# Patient Record
Sex: Male | Born: 1950 | Race: White | Hispanic: No | Marital: Married
Health system: Midwestern US, Academic
[De-identification: ages and names within clinical notes are randomized; demographics above are authoritative.]

---

## 2018-07-21 ENCOUNTER — Encounter: Admit: 2018-07-21 | Discharge: 2018-07-22 | Payer: MEDICARE | Primary: Family

## 2018-08-16 ENCOUNTER — Encounter: Admit: 2018-08-16 | Discharge: 2018-08-16 | Payer: MEDICARE | Primary: Family

## 2018-09-12 ENCOUNTER — Encounter: Admit: 2018-09-12 | Discharge: 2018-09-13 | Payer: MEDICARE | Primary: Family

## 2018-09-18 ENCOUNTER — Encounter: Admit: 2018-09-18 | Discharge: 2018-09-19 | Payer: MEDICARE | Primary: Family

## 2018-09-22 ENCOUNTER — Encounter: Admit: 2018-09-22 | Discharge: 2018-09-23 | Payer: MEDICARE | Primary: Family

## 2018-09-23 ENCOUNTER — Encounter: Admit: 2018-09-23 | Discharge: 2018-09-23 | Payer: MEDICARE | Primary: Family

## 2018-09-23 DIAGNOSIS — M7022 Olecranon bursitis, left elbow: ICD-10-CM

## 2018-09-23 DIAGNOSIS — I4891 Unspecified atrial fibrillation: ICD-10-CM

## 2018-09-23 LAB — HEMOGLOBIN A1C: Lab: 6.4 % — ABNORMAL HIGH (ref <150)

## 2018-09-23 LAB — URINALYSIS DIPSTICK
Lab: NEGATIVE
Lab: NEGATIVE
Lab: NEGATIVE 10*3/uL (ref 0–0.45)
Lab: NEGATIVE K/UL — ABNORMAL HIGH (ref 3–12)
Lab: NEGATIVE U/L (ref 7–56)
Lab: NEGATIVE mL/min (ref 0–0.80)
Lab: NEGATIVE mL/min — ABNORMAL LOW (ref 1.0–4.8)

## 2018-09-23 LAB — PHOSPHORUS: Lab: 3.1 mg/dL — ABNORMAL LOW (ref 2.0–4.5)

## 2018-09-23 LAB — BNP (B-TYPE NATRIURETIC PEPTI): Lab: 302 pg/mL — ABNORMAL HIGH (ref 0–100)

## 2018-09-23 LAB — MAGNESIUM: Lab: 1.7 mg/dL — ABNORMAL LOW (ref 1.6–2.6)

## 2018-09-23 LAB — COMPREHENSIVE METABOLIC PANEL: Lab: 139 MMOL/L — ABNORMAL LOW (ref 137–147)

## 2018-09-23 LAB — TSH WITH FREE T4 REFLEX: Lab: 3.9 uU/mL — ABNORMAL LOW (ref >40)

## 2018-09-23 LAB — LIPID PROFILE: Lab: 119 mg/dL (ref <200)

## 2018-09-23 LAB — CBC AND DIFF: Lab: 8.6 K/UL (ref 4.5–11.0)

## 2018-09-23 LAB — URINALYSIS, MICROSCOPIC

## 2018-09-23 LAB — SED RATE: Lab: 68 mm/h — ABNORMAL HIGH (ref 0–20)

## 2018-09-23 LAB — LACTIC ACID(LACTATE): Lab: 1.2 MMOL/L (ref 0.5–2.0)

## 2018-09-23 LAB — C REACTIVE PROTEIN (CRP): Lab: 14 mg/dL — ABNORMAL HIGH (ref <1.0)

## 2018-09-23 MED ORDER — IPRATROPIUM BROMIDE 0.02 % IN SOLN
.5 mg | Freq: Once | RESPIRATORY_TRACT | 0 refills | Status: CP
Start: 2018-09-23 — End: ?
  Administered 2018-09-23: 15:00:00 0.5 mg via RESPIRATORY_TRACT

## 2018-09-23 MED ORDER — VANCOMYCIN PHARMACY TO MANAGE
1 | 0 refills | Status: DC
Start: 2018-09-23 — End: 2018-09-23

## 2018-09-23 MED ORDER — OXYCODONE 5 MG PO TAB
5-10 mg | ORAL | 0 refills | Status: DC | PRN
Start: 2018-09-23 — End: 2018-10-01
  Administered 2018-09-23 – 2018-09-25 (×6): 10 mg via ORAL
  Administered 2018-09-26 (×2): 5 mg via ORAL
  Administered 2018-09-26: 20:00:00 10 mg via ORAL
  Administered 2018-09-26: 03:00:00 5 mg via ORAL
  Administered 2018-09-26 – 2018-10-01 (×18): 10 mg via ORAL

## 2018-09-23 MED ORDER — IPRATROPIUM BROMIDE 0.02 % IN SOLN
0.5 mg | RESPIRATORY_TRACT | 0 refills | Status: DC | PRN
Start: 2018-09-23 — End: 2018-09-23

## 2018-09-23 MED ORDER — HYDROCODONE-ACETAMINOPHEN 5-325 MG PO TAB
1-2 | ORAL | 0 refills | Status: DC | PRN
Start: 2018-09-23 — End: 2018-09-23
  Administered 2018-09-23: 15:00:00 2 via ORAL

## 2018-09-23 MED ORDER — FUROSEMIDE 10 MG/ML IJ SOLN
40 mg | Freq: Every day | INTRAVENOUS | 0 refills | Status: DC
Start: 2018-09-23 — End: 2018-09-24
  Administered 2018-09-23: 17:00:00 40 mg via INTRAVENOUS

## 2018-09-23 MED ORDER — METOPROLOL TARTRATE 25 MG PO TAB
25 mg | Freq: Once | ORAL | 0 refills | Status: CP
Start: 2018-09-23 — End: ?
  Administered 2018-09-23: 20:00:00 25 mg via ORAL

## 2018-09-23 MED ORDER — ASPIRIN 81 MG PO TBEC
81 mg | Freq: Every day | ORAL | 0 refills | Status: DC
Start: 2018-09-23 — End: 2018-10-01
  Administered 2018-09-23 – 2018-10-01 (×8): 81 mg via ORAL

## 2018-09-23 MED ORDER — AMIODARONE 200 MG PO TAB
200 mg | Freq: Two times a day (BID) | ORAL | 0 refills | Status: DC
Start: 2018-09-23 — End: 2018-09-23
  Administered 2018-09-23: 15:00:00 200 mg via ORAL

## 2018-09-23 MED ORDER — MAGNESIUM SULFATE IN D5W 1 GRAM/100 ML IV PGBK
1 g | Freq: Once | INTRAVENOUS | 0 refills | Status: CP
Start: 2018-09-23 — End: ?
  Administered 2018-09-24: 03:00:00 1 g via INTRAVENOUS

## 2018-09-23 MED ORDER — RIVAROXABAN 20 MG PO TAB
20 mg | Freq: Every day | ORAL | 0 refills | Status: DC
Start: 2018-09-23 — End: 2018-09-24
  Administered 2018-09-23: 23:00:00 20 mg via ORAL

## 2018-09-23 MED ORDER — DIGOXIN 125 MCG (0.125 MG) PO TAB
250 ug | Freq: Once | ORAL | 0 refills | Status: CP
Start: 2018-09-23 — End: ?
  Administered 2018-09-23: 20:00:00 250 ug via ORAL

## 2018-09-23 MED ORDER — VANCOMYCIN 1,750 MG IVPB
15 mg/kg | INTRAVENOUS | 0 refills | Status: DC
Start: 2018-09-23 — End: 2018-09-23

## 2018-09-23 MED ORDER — VENLAFAXINE 150 MG PO CP24
150 mg | Freq: Every day | ORAL | 0 refills | Status: DC
Start: 2018-09-23 — End: 2018-10-01
  Administered 2018-09-23 – 2018-10-01 (×9): 150 mg via ORAL

## 2018-09-23 MED ORDER — TAMSULOSIN 0.4 MG PO CAP
0.4 mg | Freq: Every day | ORAL | 0 refills | Status: DC
Start: 2018-09-23 — End: 2018-10-01
  Administered 2018-09-23 – 2018-10-01 (×9): 0.4 mg via ORAL

## 2018-09-23 MED ORDER — ALBUTEROL SULFATE 2.5 MG/0.5 ML IN NEBU
2.5 mg | Freq: Once | RESPIRATORY_TRACT | 0 refills | Status: CP
Start: 2018-09-23 — End: ?
  Administered 2018-09-23: 15:00:00 2.5 mg via RESPIRATORY_TRACT

## 2018-09-23 MED ORDER — GABAPENTIN 300 MG PO CAP
600 mg | Freq: Two times a day (BID) | ORAL | 0 refills | Status: DC
Start: 2018-09-23 — End: 2018-10-01
  Administered 2018-09-23 – 2018-10-01 (×17): 600 mg via ORAL

## 2018-09-23 MED ORDER — LACTATED RINGERS IV SOLP
500 mL | INTRAVENOUS | 0 refills | Status: CP
Start: 2018-09-23 — End: ?
  Administered 2018-09-24: 03:00:00 500 mL via INTRAVENOUS

## 2018-09-23 MED ORDER — ATORVASTATIN 40 MG PO TAB
40 mg | Freq: Every evening | ORAL | 0 refills | Status: DC
Start: 2018-09-23 — End: 2018-10-01
  Administered 2018-09-24 – 2018-10-01 (×8): 40 mg via ORAL

## 2018-09-23 MED ORDER — ACETAMINOPHEN 500 MG PO TAB
1000 mg | ORAL | 0 refills | Status: DC
Start: 2018-09-23 — End: 2018-10-01
  Administered 2018-09-23 – 2018-10-01 (×21): 1000 mg via ORAL

## 2018-09-23 MED ORDER — DAPTOMYCIN IVPB
6 mg/kg | INTRAVENOUS | 0 refills | Status: DC
Start: 2018-09-23 — End: 2018-10-01
  Administered 2018-09-23 – 2018-10-01 (×18): 550 mg via INTRAVENOUS

## 2018-09-23 MED ORDER — UMECLIDINIUM-VILANTEROL 62.5-25 MCG/ACTUATION IN DSDV
1 | Freq: Every day | RESPIRATORY_TRACT | 0 refills | Status: DC
Start: 2018-09-23 — End: 2018-10-01
  Administered 2018-09-23: 15:00:00 1 via RESPIRATORY_TRACT

## 2018-09-23 MED ORDER — ONDANSETRON HCL (PF) 4 MG/2 ML IJ SOLN
4 mg | INTRAVENOUS | 0 refills | Status: DC | PRN
Start: 2018-09-23 — End: 2018-10-01

## 2018-09-23 MED ORDER — FLUTICASONE PROPIONATE 110 MCG/ACTUATION IN HFAA
2 | Freq: Two times a day (BID) | RESPIRATORY_TRACT | 0 refills | Status: DC
Start: 2018-09-23 — End: 2018-10-01
  Administered 2018-09-24: 12:00:00 2 via RESPIRATORY_TRACT

## 2018-09-23 MED ORDER — METOPROLOL TARTRATE 25 MG PO TAB
25 mg | Freq: Two times a day (BID) | ORAL | 0 refills | Status: DC
Start: 2018-09-23 — End: 2018-09-23
  Administered 2018-09-23: 15:00:00 25 mg via ORAL

## 2018-09-23 MED ORDER — ALBUTEROL SULFATE 2.5 MG/0.5 ML IN NEBU
2.5 mg | RESPIRATORY_TRACT | 0 refills | Status: DC | PRN
Start: 2018-09-23 — End: 2018-10-01
  Administered 2018-09-23 – 2018-09-30 (×21): 2.5 mg via RESPIRATORY_TRACT

## 2018-09-23 MED ORDER — METOPROLOL TARTRATE 50 MG PO TAB
50 mg | Freq: Two times a day (BID) | ORAL | 0 refills | Status: DC
Start: 2018-09-23 — End: 2018-10-01
  Administered 2018-09-24 – 2018-10-01 (×16): 50 mg via ORAL

## 2018-09-23 MED ORDER — MELATONIN 3 MG PO TAB
3 mg | Freq: Every evening | ORAL | 0 refills | Status: DC | PRN
Start: 2018-09-23 — End: 2018-10-01
  Administered 2018-09-25 – 2018-10-01 (×7): 3 mg via ORAL

## 2018-09-23 MED ORDER — DIGOXIN 125 MCG (0.125 MG) PO TAB
125 ug | Freq: Every day | ORAL | 0 refills | Status: DC
Start: 2018-09-23 — End: 2018-10-01
  Administered 2018-09-24 – 2018-10-01 (×8): 125 ug via ORAL

## 2018-09-23 MED ORDER — ONDANSETRON HCL 4 MG PO TAB
4 mg | ORAL | 0 refills | Status: DC | PRN
Start: 2018-09-23 — End: 2018-10-01

## 2018-09-23 MED ORDER — DILTIAZEM HCL 180 MG PO CP24
180 mg | Freq: Every day | ORAL | 0 refills | Status: DC
Start: 2018-09-23 — End: 2018-09-23

## 2018-09-24 ENCOUNTER — Encounter: Admit: 2018-09-24 | Discharge: 2018-09-24 | Payer: MEDICARE | Primary: Family

## 2018-09-24 ENCOUNTER — Encounter: Admit: 2018-09-24 | Discharge: 2018-09-25 | Payer: MEDICARE | Primary: Family

## 2018-09-24 DIAGNOSIS — M00022 Staphylococcal arthritis, left elbow: Secondary | ICD-10-CM

## 2018-09-24 LAB — GRAM STAIN

## 2018-09-24 LAB — COMPREHENSIVE METABOLIC PANEL
Lab: 1.2 mg/dL — ABNORMAL HIGH (ref 0.4–1.24)
Lab: 132 mg/dL — ABNORMAL HIGH (ref 70–100)
Lab: 136 MMOL/L — ABNORMAL LOW (ref 137–147)
Lab: 14 — ABNORMAL HIGH (ref 3–12)
Lab: 16 U/L (ref 7–56)
Lab: 20 mg/dL (ref 7–25)
Lab: 56 mL/min — ABNORMAL LOW (ref >60)
Lab: 6.8 g/dL (ref 6.0–8.0)
Lab: 8.6 mg/dL — ABNORMAL HIGH (ref 8.5–10.6)
Lab: >60 mL/min (ref >60)
Lab: 136 MMOL/L — ABNORMAL LOW (ref 137–147)

## 2018-09-24 LAB — TROPONIN-I: Lab: 0 ng/mL — ABNORMAL LOW (ref 0.0–0.05)

## 2018-09-24 LAB — POC BLOOD GAS ARTERIAL
Lab: 27 MMOL/L (ref 21–28)
Lab: 3 MMOL/L
Lab: 49 mmHg — ABNORMAL HIGH (ref 35–45)
Lab: 7.3 % — ABNORMAL HIGH (ref 7.35–7.45)
Lab: 90 % — ABNORMAL LOW (ref 95–99)
Lab: 31 MMOL/L — ABNORMAL HIGH (ref 21–28)
Lab: 46 mmHg — ABNORMAL HIGH (ref 35–45)
Lab: 7 MMOL/L
Lab: 7.4 (ref 7.35–7.45)
Lab: 90 mmHg (ref 80–100)
Lab: 97 % (ref 95–99)

## 2018-09-24 LAB — LACTIC ACID (BG - RAPID LACTATE): Lab: 3.6 MMOL/L — ABNORMAL HIGH (ref 0.5–2.0)

## 2018-09-24 LAB — POC LACTATE: Lab: 2.7 MMOL/L — ABNORMAL HIGH (ref 0.5–2.0)

## 2018-09-24 LAB — PHOSPHORUS
Lab: 4.5 mg/dL (ref 2.0–4.5)
Lab: 4 mg/dL — ABNORMAL LOW (ref >60)

## 2018-09-24 LAB — CBC: Lab: 9.8 10*3/uL (ref 4.5–11.0)

## 2018-09-24 LAB — CBC AND DIFF: Lab: 9 K/UL — ABNORMAL LOW (ref 4.5–11.0)

## 2018-09-24 LAB — LACTIC ACID(LACTATE): Lab: 0.8 MMOL/L — ABNORMAL LOW (ref >60)

## 2018-09-24 LAB — CREATINE KINASE-CPK: Lab: 326 U/L — ABNORMAL HIGH (ref 35–232)

## 2018-09-24 LAB — MAGNESIUM
Lab: 1.9 mg/dL (ref 1.6–2.6)
Lab: 2.5 mg/dL (ref 1.6–2.6)

## 2018-09-24 MED ORDER — LIDOCAINE (PF) 10 MG/ML (1 %) IJ SOLN
SUBCUTANEOUS | 0 refills | Status: CP
Start: 2018-09-24 — End: ?
  Administered 2018-09-24: 17:00:00 3 mL via SUBCUTANEOUS

## 2018-09-24 MED ORDER — FENTANYL CITRATE (PF) 50 MCG/ML IJ SOLN
0 refills | Status: DC
Start: 2018-09-24 — End: 2018-09-24
  Administered 2018-09-24 (×2): 25 ug via INTRAVENOUS

## 2018-09-24 MED ORDER — ELECTROLYTE-A IV SOLP
0 refills | Status: DC
Start: 2018-09-24 — End: 2018-09-24
  Administered 2018-09-24: 17:00:00 via INTRAVENOUS

## 2018-09-24 MED ORDER — PHENYLEPHRINE IN 0.9% NACL(PF) 1 MG/10 ML (100 MCG/ML) IV SYRG
INTRAVENOUS | 0 refills | Status: DC
Start: 2018-09-24 — End: 2018-09-24
  Administered 2018-09-24 (×5): 100 ug via INTRAVENOUS

## 2018-09-24 MED ORDER — METOPROLOL TARTRATE 5 MG/5 ML IV SOLN
0 refills | Status: DC
Start: 2018-09-24 — End: 2018-09-24
  Administered 2018-09-24 (×4): 5 mg via INTRAVENOUS

## 2018-09-24 MED ORDER — LORAZEPAM 2 MG/ML IJ SOLN
2 mg | Freq: Once | INTRAVENOUS | 0 refills | Status: CP
Start: 2018-09-24 — End: ?
  Administered 2018-09-24: 15:00:00 2 mg via INTRAVENOUS

## 2018-09-24 MED ORDER — DEXMEDETOMIDINE 4 MCG/ML (INFUSION)(AM)(OR)
0 refills | Status: DC
Start: 2018-09-24 — End: 2018-09-24
  Administered 2018-09-24: 17:00:00 20 ug via INTRAVENOUS

## 2018-09-24 MED ORDER — PROPOFOL INJ 10 MG/ML IV VIAL
0 refills | Status: DC
Start: 2018-09-24 — End: 2018-09-24
  Administered 2018-09-24: 17:00:00 100 mg via INTRAVENOUS
  Administered 2018-09-24: 17:00:00 40 mg via INTRAVENOUS
  Administered 2018-09-24: 17:00:00 10 mg via INTRAVENOUS
  Administered 2018-09-24: 17:00:00 30 mg via INTRAVENOUS
  Administered 2018-09-24: 17:00:00 20 mg via INTRAVENOUS

## 2018-09-24 MED ORDER — SODIUM CHLORIDE 0.9 % IJ SOLN
50 mL | Freq: Once | INTRAVENOUS | 0 refills | Status: CP
Start: 2018-09-24 — End: ?
  Administered 2018-09-24: 17:00:00 50 mL via INTRAVENOUS

## 2018-09-24 MED ORDER — MIDAZOLAM 1 MG/ML IJ SOLN
INTRAVENOUS | 0 refills | Status: DC
Start: 2018-09-24 — End: 2018-09-24

## 2018-09-24 MED ORDER — VANCOMYCIN 1,000 MG IV SOLR
0 refills | Status: DC
Start: 2018-09-24 — End: 2018-09-24
  Administered 2018-09-24: 17:00:00 1 g via TOPICAL

## 2018-09-24 MED ORDER — ONDANSETRON HCL (PF) 4 MG/2 ML IJ SOLN
INTRAVENOUS | 0 refills | Status: DC
Start: 2018-09-24 — End: 2018-09-24
  Administered 2018-09-24: 17:00:00 4 mg via INTRAVENOUS

## 2018-09-24 MED ORDER — IOHEXOL 350 MG IODINE/ML IV SOLN
100 mL | Freq: Once | INTRAVENOUS | 0 refills | Status: CP
Start: 2018-09-24 — End: ?
  Administered 2018-09-24: 17:00:00 100 mL via INTRAVENOUS

## 2018-09-24 MED ORDER — MEPIVACAINE (PF) 15 MG/ML (1.5 %) IJ SOLN
0 refills | Status: CP
Start: 2018-09-24 — End: ?
  Administered 2018-09-24: 17:00:00 30 mL

## 2018-09-25 DIAGNOSIS — M00022 Staphylococcal arthritis, left elbow: Secondary | ICD-10-CM

## 2018-09-25 LAB — CBC AND DIFF: Lab: 9 K/UL (ref 4.5–11.0)

## 2018-09-25 LAB — CREATINE KINASE-CPK: Lab: 357 U/L — ABNORMAL HIGH (ref 35–232)

## 2018-09-25 LAB — COMPREHENSIVE METABOLIC PANEL: Lab: 135 MMOL/L — ABNORMAL LOW (ref 137–147)

## 2018-09-25 MED ORDER — FUROSEMIDE 40 MG PO TAB
40 mg | Freq: Every day | ORAL | 0 refills | Status: DC
Start: 2018-09-25 — End: 2018-10-01
  Administered 2018-09-26 – 2018-10-01 (×6): 40 mg via ORAL

## 2018-09-25 MED ORDER — PERFLUTREN LIPID MICROSPHERES 1.1 MG/ML IV SUSP
1-20 mL | Freq: Once | INTRAVENOUS | 0 refills | Status: CP | PRN
Start: 2018-09-25 — End: ?
  Administered 2018-09-25: 17:00:00 1 mL via INTRAVENOUS

## 2018-09-26 ENCOUNTER — Encounter: Admit: 2018-09-26 | Discharge: 2018-09-26 | Payer: MEDICARE | Primary: Family

## 2018-09-26 ENCOUNTER — Encounter: Admit: 2018-09-26 | Discharge: 2018-09-27 | Payer: MEDICARE | Primary: Family

## 2018-09-26 DIAGNOSIS — M00022 Staphylococcal arthritis, left elbow: Secondary | ICD-10-CM

## 2018-09-26 LAB — CBC AND DIFF
Lab: 0.1 10*3/uL (ref 0–0.20)
Lab: 0.1 10*3/uL (ref 0–0.45)
Lab: 0.7 10*3/uL (ref 0–0.80)
Lab: 1 % (ref 0–2)
Lab: 1 % (ref 0–5)
Lab: 1.1 10*3/uL (ref 1.0–4.8)
Lab: 10 % (ref 4–12)
Lab: 10 g/dL — ABNORMAL LOW (ref 13.5–16.5)
Lab: 15 % — ABNORMAL HIGH (ref 11–15)
Lab: 15 % — ABNORMAL LOW (ref 24–44)
Lab: 260 10*3/uL (ref 150–400)
Lab: 29 % — ABNORMAL LOW (ref 40–50)
Lab: 3.2 M/UL — ABNORMAL LOW (ref 4.4–5.5)
Lab: 30 pg (ref 26–34)
Lab: 33 g/dL (ref 32.0–36.0)
Lab: 5.1 10*3/uL (ref 1.8–7.0)
Lab: 6.7 FL — ABNORMAL LOW (ref 7–11)
Lab: 7.1 10*3/uL (ref 4.5–11.0)
Lab: 73 % (ref 41–77)
Lab: 89 FL (ref 80–100)

## 2018-09-26 LAB — CULTURE-WOUND/TISSUE/FLUID(AEROBIC ONLY)W/SENSITIVITY

## 2018-09-26 LAB — GRAM STAIN

## 2018-09-26 LAB — CREATINE KINASE-CPK: Lab: 219 U/L — ABNORMAL LOW (ref >60)

## 2018-09-26 LAB — COMPREHENSIVE METABOLIC PANEL: Lab: 138 MMOL/L — ABNORMAL HIGH (ref >60)

## 2018-09-26 MED ORDER — HEPARIN, PORCINE (PF) 5,000 UNIT/0.5 ML IJ SYRG
5000 [IU] | SUBCUTANEOUS | 0 refills | Status: DC
Start: 2018-09-26 — End: 2018-09-27
  Administered 2018-09-26: 23:00:00 5000 [IU] via SUBCUTANEOUS

## 2018-09-26 MED ORDER — OXYCODONE 5 MG PO TAB
5-10 mg | Freq: Once | ORAL | 0 refills | Status: DC | PRN
Start: 2018-09-26 — End: 2018-09-26

## 2018-09-26 MED ORDER — HEPARIN (PORCINE) IN 5 % DEX 20,000 UNIT/500 ML (40 UNIT/ML) IV SOLP
0-2000 [IU]/h | INTRAVENOUS | 0 refills | Status: DC
Start: 2018-09-26 — End: 2018-09-26

## 2018-09-26 MED ORDER — LACTATED RINGERS IV SOLP
1000 mL | INTRAVENOUS | 0 refills | Status: DC
Start: 2018-09-26 — End: 2018-09-27

## 2018-09-26 MED ORDER — HEPARIN (PORCINE) 1,000 UNIT/ML IJ SOLN
20-40 [IU]/kg | INTRAVENOUS | 0 refills | Status: DC
Start: 2018-09-26 — End: 2018-09-30
  Administered 2018-09-27: 14:00:00 5060 [IU] via INTRAVENOUS
  Administered 2018-09-28 (×2): 2530 [IU] via INTRAVENOUS

## 2018-09-26 MED ORDER — LIDOCAINE (PF) 20 MG/ML (2 %) IJ SOLN
0 refills | Status: CP
Start: 2018-09-26 — End: ?
  Administered 2018-09-26: 16:00:00 30 mL

## 2018-09-26 MED ORDER — PHENYLEPHRINE IN 0.9% NACL(PF) 1 MG/10 ML (100 MCG/ML) IV SYRG
INTRAVENOUS | 0 refills | Status: DC
Start: 2018-09-26 — End: 2018-09-26
  Administered 2018-09-26: 16:00:00 200 ug via INTRAVENOUS
  Administered 2018-09-26: 17:00:00 100 ug via INTRAVENOUS
  Administered 2018-09-26 (×3): 200 ug via INTRAVENOUS
  Administered 2018-09-26: 17:00:00 100 ug via INTRAVENOUS

## 2018-09-26 MED ORDER — FENTANYL CITRATE (PF) 50 MCG/ML IJ SOLN
25 ug | INTRAVENOUS | 0 refills | Status: DC | PRN
Start: 2018-09-26 — End: 2018-09-26

## 2018-09-26 MED ORDER — LIDOCAINE (PF) 10 MG/ML (1 %) IJ SOLN
SUBCUTANEOUS | 0 refills | Status: CP
Start: 2018-09-26 — End: ?
  Administered 2018-09-26: 16:00:00 3 mL via SUBCUTANEOUS

## 2018-09-26 MED ORDER — PROPOFOL INJ 10 MG/ML IV VIAL
0 refills | Status: DC
Start: 2018-09-26 — End: 2018-09-26
  Administered 2018-09-26: 16:00:00 50 mg via INTRAVENOUS

## 2018-09-26 MED ORDER — HEPARIN (PORCINE) IN 5 % DEX 20,000 UNIT/500 ML (40 UNIT/ML) IV SOLP
0-2000 [IU]/h | INTRAVENOUS | 0 refills | Status: DC
Start: 2018-09-26 — End: 2018-09-30
  Administered 2018-09-27 (×2): 1700 [IU]/h via INTRAVENOUS
  Administered 2018-09-28: 15:00:00 2100 [IU]/h via INTRAVENOUS
  Administered 2018-09-28: 03:00:00 1900 [IU]/h via INTRAVENOUS
  Administered 2018-09-28: 06:00:00 2000 [IU]/h via INTRAVENOUS
  Administered 2018-09-29: 11:00:00 2100 [IU]/h via INTRAVENOUS
  Administered 2018-09-29: 21:00:00 52.5 [IU]/h via INTRAVENOUS
  Administered 2018-09-29 – 2018-09-30 (×3): 2100 [IU]/h via INTRAVENOUS

## 2018-09-26 MED ORDER — HALOPERIDOL LACTATE 5 MG/ML IJ SOLN
1 mg | Freq: Once | INTRAVENOUS | 0 refills | Status: DC | PRN
Start: 2018-09-26 — End: 2018-09-26

## 2018-09-26 MED ORDER — MIDAZOLAM 1 MG/ML IJ SOLN
INTRAVENOUS | 0 refills | Status: DC
Start: 2018-09-26 — End: 2018-09-26
  Administered 2018-09-26 (×2): 1 mg via INTRAVENOUS

## 2018-09-26 MED ORDER — DEXMEDETOMIDINE 4 MCG/ML (INFUSION)(AM)(OR)
0 refills | Status: DC
Start: 2018-09-26 — End: 2018-09-26
  Administered 2018-09-26: 16:00:00 .7 ug/kg/h via INTRAVENOUS

## 2018-09-26 MED ADMIN — LACTATED RINGERS IV SOLP [4318]: 1000 mL | INTRAVENOUS | @ 15:00:00 | Stop: 2018-09-26 | NDC 00338011704

## 2018-09-27 LAB — BASIC METABOLIC PANEL
Lab: 0.9 mg/dL (ref 0.4–1.24)
Lab: 101 MMOL/L — ABNORMAL LOW (ref 98–110)
Lab: 12 mg/dL — ABNORMAL HIGH (ref 7–25)
Lab: 139 MMOL/L — ABNORMAL LOW (ref 137–147)
Lab: 31 MMOL/L — ABNORMAL HIGH (ref 21–30)
Lab: 4.5 MMOL/L — ABNORMAL LOW (ref 3.5–5.1)
Lab: 7 pg (ref 3–12)
Lab: 8.6 mg/dL (ref 8.5–10.6)
Lab: 99 mg/dL (ref 70–100)
Lab: >60 mL/min (ref >60)
Lab: >60 mL/min — ABNORMAL LOW (ref >60)

## 2018-09-27 LAB — CULTURE-WOUND/TISSUE/FLUID(AEROBIC ONLY)W/SENSITIVITY

## 2018-09-27 LAB — CBC
Lab: 15 % — ABNORMAL HIGH (ref 11–15)
Lab: 29 pg (ref 26–34)
Lab: 3.4 M/UL — ABNORMAL LOW (ref 4.4–5.5)
Lab: 30 % — ABNORMAL LOW (ref 40–50)
Lab: 327 10*3/uL (ref 150–400)
Lab: 33 g/dL (ref 32.0–36.0)
Lab: 6.7 FL — ABNORMAL LOW (ref 7–11)
Lab: 7.6 10*3/uL (ref 4.5–11.0)
Lab: 88 FL (ref 80–100)

## 2018-09-27 LAB — CBC AND DIFF
Lab: 0.1 10*3/uL (ref 0–0.20)
Lab: 0.1 10*3/uL (ref 0–0.45)
Lab: 0.6 10*3/uL (ref 0–0.80)
Lab: 1 % (ref 0–2)
Lab: 1 % (ref 0–5)
Lab: 1.3 10*3/uL (ref 1.0–4.8)
Lab: 4.8 10*3/uL (ref 1.8–7.0)
Lab: 6.9 K/UL (ref 4.5–11.0)
Lab: 9 % (ref 4–12)

## 2018-09-27 LAB — PTT (APTT)
Lab: 28 s — ABNORMAL LOW (ref 24.0–36.5)
Lab: 30 s (ref 24.0–36.5)
Lab: 46 s — ABNORMAL HIGH (ref 24.0–36.5)

## 2018-09-27 MED ORDER — MORPHINE 2 MG/ML IV CRTG
2 mg | Freq: Once | INTRAVENOUS | 0 refills | Status: CP
Start: 2018-09-27 — End: ?
  Administered 2018-09-27: 17:00:00 2 mg via INTRAVENOUS

## 2018-09-28 ENCOUNTER — Encounter: Admit: 2018-09-28 | Discharge: 2018-09-28 | Payer: MEDICARE | Primary: Family

## 2018-09-28 LAB — VITAMIN B12: Lab: 496 pg/mL (ref 180–914)

## 2018-09-28 LAB — COMPREHENSIVE METABOLIC PANEL
Lab: 0.3 mg/dL (ref 0.3–1.2)
Lab: 1 mg/dL (ref 0.4–1.24)
Lab: 140 MMOL/L (ref 137–147)
Lab: 20 U/L (ref 7–40)
Lab: 20 U/L (ref 7–56)
Lab: 3.3 g/dL — ABNORMAL LOW (ref 3.5–5.0)
Lab: 35 MMOL/L — ABNORMAL HIGH (ref 21–30)
Lab: 4.2 MMOL/L (ref 3.5–5.1)
Lab: 6.1 g/dL (ref 6.0–8.0)
Lab: 76 U/L (ref 25–110)
Lab: 9.1 mg/dL (ref 8.5–10.6)
Lab: >60 mL/min (ref >60)
Lab: >60 mL/min (ref >60)
Lab: 8 (ref 3–12)

## 2018-09-28 LAB — CBC AND DIFF: Lab: 7.4 K/UL — ABNORMAL LOW (ref 4.5–11.0)

## 2018-09-28 LAB — FOLATE, SERUM: Lab: 20 ng/mL — ABNORMAL LOW (ref >3.9)

## 2018-09-28 LAB — PTT (APTT)
Lab: 40 s — ABNORMAL HIGH (ref 24.0–36.5)
Lab: 44 s — ABNORMAL HIGH (ref 24.0–36.5)
Lab: 65 s — ABNORMAL HIGH (ref 24.0–36.5)

## 2018-09-28 LAB — MAGNESIUM: Lab: 1.9 mg/dL — ABNORMAL HIGH (ref 1.6–2.6)

## 2018-09-29 ENCOUNTER — Encounter: Admit: 2018-09-29 | Discharge: 2018-09-30 | Payer: MEDICARE | Primary: Family

## 2018-09-29 ENCOUNTER — Encounter: Admit: 2018-09-29 | Discharge: 2018-09-29 | Payer: MEDICARE | Primary: Family

## 2018-09-29 DIAGNOSIS — E669 Obesity, unspecified: Secondary | ICD-10-CM

## 2018-09-29 DIAGNOSIS — I420 Dilated cardiomyopathy: Secondary | ICD-10-CM

## 2018-09-29 DIAGNOSIS — I2510 Atherosclerotic heart disease of native coronary artery without angina pectoris: Secondary | ICD-10-CM

## 2018-09-29 DIAGNOSIS — J449 Chronic obstructive pulmonary disease, unspecified: Secondary | ICD-10-CM

## 2018-09-29 DIAGNOSIS — I4891 Unspecified atrial fibrillation: Secondary | ICD-10-CM

## 2018-09-29 DIAGNOSIS — G4733 Obstructive sleep apnea (adult) (pediatric): Secondary | ICD-10-CM

## 2018-09-29 LAB — CULTURE-ANAEROBIC

## 2018-09-29 LAB — PTT (APTT)
Lab: 46 s — ABNORMAL HIGH (ref 24.0–36.5)
Lab: 61 s — ABNORMAL HIGH (ref 24.0–36.5)

## 2018-09-29 LAB — CULTURE-BLOOD W/SENSITIVITY

## 2018-09-29 LAB — CBC AND DIFF: Lab: 8.8 K/UL — ABNORMAL LOW (ref 4.5–11.0)

## 2018-09-29 LAB — MAGNESIUM: Lab: 1.9 mg/dL — ABNORMAL HIGH (ref 1.6–2.6)

## 2018-09-29 LAB — DIGOXIN LEVEL: Lab: 0.4 ng/mL — ABNORMAL LOW (ref 0.5–1.0)

## 2018-09-29 LAB — COMPREHENSIVE METABOLIC PANEL
Lab: 141 MMOL/L — ABNORMAL LOW (ref 137–147)
Lab: 4.2 MMOL/L — ABNORMAL LOW (ref 3.5–5.1)

## 2018-09-29 MED ORDER — DIPHENHYDRAMINE HCL 50 MG/ML IJ SOLN
25 mg | Freq: Once | INTRAVENOUS | 0 refills | Status: DC | PRN
Start: 2018-09-29 — End: 2018-09-29

## 2018-09-29 MED ORDER — MIDAZOLAM 1 MG/ML IJ SOLN
INTRAVENOUS | 0 refills | Status: DC
Start: 2018-09-29 — End: 2018-09-29
  Administered 2018-09-29 (×2): 1 mg via INTRAVENOUS

## 2018-09-29 MED ORDER — LACTATED RINGERS IV SOLP
INTRAVENOUS | 0 refills | Status: DC
Start: 2018-09-29 — End: 2018-09-30

## 2018-09-29 MED ORDER — METOCLOPRAMIDE HCL 5 MG/ML IJ SOLN
10 mg | Freq: Once | INTRAVENOUS | 0 refills | Status: DC | PRN
Start: 2018-09-29 — End: 2018-09-29

## 2018-09-29 MED ORDER — FENTANYL CITRATE (PF) 50 MCG/ML IJ SOLN
0 refills | Status: DC
Start: 2018-09-29 — End: 2018-09-29
  Administered 2018-09-29 (×2): 50 ug via INTRAVENOUS

## 2018-09-29 MED ORDER — PROMETHAZINE 25 MG/ML IJ SOLN
6.25 mg | INTRAVENOUS | 0 refills | Status: DC | PRN
Start: 2018-09-29 — End: 2018-09-29

## 2018-09-29 MED ORDER — FENTANYL CITRATE (PF) 50 MCG/ML IJ SOLN
25 ug | INTRAVENOUS | 0 refills | Status: DC | PRN
Start: 2018-09-29 — End: 2018-09-29
  Administered 2018-09-29: 20:00:00 25 ug via INTRAVENOUS

## 2018-09-29 MED ORDER — ROPIVACAINE (PF) 5 MG/ML (0.5 %) IJ SOLN
0 refills | Status: CP
Start: 2018-09-29 — End: ?
  Administered 2018-09-29: 19:00:00 10 mL

## 2018-09-29 MED ORDER — PROPOFOL 10 MG/ML IV EMUL (INFUSION)(AM)(OR)
0 refills | Status: DC
Start: 2018-09-29 — End: 2018-09-29
  Administered 2018-09-29: 19:00:00 50 ug/kg/min via INTRAVENOUS

## 2018-09-29 MED ORDER — LIDOCAINE (PF) 10 MG/ML (1 %) IJ SOLN
0 refills | Status: DC
Start: 2018-09-29 — End: 2018-09-29
  Administered 2018-09-29: 19:00:00 10 mL

## 2018-09-29 MED ORDER — FENTANYL CITRATE (PF) 50 MCG/ML IJ SOLN
50 ug | INTRAVENOUS | 0 refills | Status: DC | PRN
Start: 2018-09-29 — End: 2018-09-29

## 2018-09-29 MED ORDER — LIDOCAINE (PF) 20 MG/ML (2 %) IJ SOLN
0 refills | Status: CP
Start: 2018-09-29 — End: ?
  Administered 2018-09-29: 19:00:00 2 mL

## 2018-09-29 MED ORDER — LIDOCAINE (PF) 20 MG/ML (2 %) IJ SOLN
0 refills | Status: CP
Start: 2018-09-29 — End: ?
  Administered 2018-09-29: 19:00:00 3 mL

## 2018-09-29 MED ORDER — KETAMINE 10 MG/ML IJ SOLN
0 refills | Status: DC
Start: 2018-09-29 — End: 2018-09-29
  Administered 2018-09-29: 20:00:00 20 mg via INTRAVENOUS
  Administered 2018-09-29: 20:00:00 10 mg via INTRAVENOUS

## 2018-09-29 MED ORDER — MULTIVIT-IRON-FA-CALCIUM-MINS 9 MG IRON-400 MCG PO TAB
1 | Freq: Every day | ORAL | 0 refills | Status: DC
Start: 2018-09-29 — End: 2018-10-01
  Administered 2018-09-29 – 2018-10-01 (×3): 1 via ORAL

## 2018-09-29 MED ORDER — FENTANYL CITRATE (PF) 50 MCG/ML IJ SOLN
50 ug | INTRAVENOUS | 0 refills | Status: DC | PRN
Start: 2018-09-29 — End: 2018-09-29
  Administered 2018-09-29: 21:00:00 50 ug via INTRAVENOUS

## 2018-09-29 MED ADMIN — LACTATED RINGERS IV SOLP [4318]: INTRAVENOUS | @ 19:00:00 | Stop: 2018-09-29 | NDC 00338011704

## 2018-09-30 LAB — MAGNESIUM: Lab: 2 mg/dL — ABNORMAL HIGH (ref 1.6–2.6)

## 2018-09-30 LAB — CBC AND DIFF: Lab: 9.5 K/UL — ABNORMAL HIGH (ref >60)

## 2018-09-30 LAB — PTT (APTT): Lab: 103 s — ABNORMAL HIGH (ref 24.0–36.5)

## 2018-09-30 LAB — COMPREHENSIVE METABOLIC PANEL: Lab: 140 MMOL/L (ref 137–147)

## 2018-09-30 LAB — IRON + BINDING CAPACITY + %SAT+ FERRITIN: Lab: 95 ug/dL — ABNORMAL LOW (ref >60)

## 2018-09-30 MED ORDER — ENOXAPARIN 120 MG/0.8 ML SC SYRG
1 mg/kg | Freq: Two times a day (BID) | SUBCUTANEOUS | 0 refills | Status: DC
Start: 2018-09-30 — End: 2018-10-01
  Administered 2018-09-30 – 2018-10-01 (×3): 120 mg via SUBCUTANEOUS

## 2018-10-01 ENCOUNTER — Encounter: Admit: 2018-10-01 | Discharge: 2018-10-01 | Payer: MEDICARE | Primary: Family

## 2018-10-01 ENCOUNTER — Inpatient Hospital Stay: Admit: 2018-09-23 | Discharge: 2018-09-23 | Payer: MEDICARE | Primary: Family

## 2018-10-01 ENCOUNTER — Inpatient Hospital Stay: Admit: 2018-09-25 | Discharge: 2018-09-25 | Payer: MEDICARE | Primary: Family

## 2018-10-01 ENCOUNTER — Inpatient Hospital Stay: Admit: 2018-09-24 | Discharge: 2018-09-24 | Payer: MEDICARE | Primary: Family

## 2018-10-01 ENCOUNTER — Inpatient Hospital Stay: Admit: 2018-09-28 | Discharge: 2018-09-28 | Payer: MEDICARE | Primary: Family

## 2018-10-01 ENCOUNTER — Ambulatory Visit
Admit: 2018-09-23 | Discharge: 2018-10-01 | Disposition: A | Discharge status: Home / Self Care | Payer: MEDICARE | Source: Other Acute Inpatient Hospital

## 2018-10-01 ENCOUNTER — Inpatient Hospital Stay: Admit: 2018-09-29 | Discharge: 2018-09-29 | Payer: MEDICARE | Primary: Family

## 2018-10-01 ENCOUNTER — Inpatient Hospital Stay: Admit: 2018-09-26 | Discharge: 2018-09-26 | Payer: MEDICARE | Primary: Family

## 2018-10-01 DIAGNOSIS — Z86718 Personal history of other venous thrombosis and embolism: ICD-10-CM

## 2018-10-01 DIAGNOSIS — Z9181 History of falling: ICD-10-CM

## 2018-10-01 DIAGNOSIS — I4819 Other persistent atrial fibrillation: Secondary | ICD-10-CM

## 2018-10-01 DIAGNOSIS — N179 Acute kidney failure, unspecified: ICD-10-CM

## 2018-10-01 DIAGNOSIS — G8929 Other chronic pain: ICD-10-CM

## 2018-10-01 DIAGNOSIS — D649 Anemia, unspecified: ICD-10-CM

## 2018-10-01 DIAGNOSIS — N183 Chronic kidney disease, stage 3 (moderate): ICD-10-CM

## 2018-10-01 DIAGNOSIS — Z7982 Long term (current) use of aspirin: Secondary | ICD-10-CM

## 2018-10-01 DIAGNOSIS — Z955 Presence of coronary angioplasty implant and graft: ICD-10-CM

## 2018-10-01 DIAGNOSIS — J9601 Acute respiratory failure with hypoxia: Secondary | ICD-10-CM

## 2018-10-01 DIAGNOSIS — Z6835 Body mass index (BMI) 35.0-35.9, adult: ICD-10-CM

## 2018-10-01 DIAGNOSIS — F17210 Nicotine dependence, cigarettes, uncomplicated: Secondary | ICD-10-CM

## 2018-10-01 DIAGNOSIS — E669 Obesity, unspecified: Secondary | ICD-10-CM

## 2018-10-01 DIAGNOSIS — N400 Enlarged prostate without lower urinary tract symptoms: ICD-10-CM

## 2018-10-01 DIAGNOSIS — I420 Dilated cardiomyopathy: Secondary | ICD-10-CM

## 2018-10-01 DIAGNOSIS — M5441 Lumbago with sciatica, right side: Secondary | ICD-10-CM

## 2018-10-01 DIAGNOSIS — F329 Major depressive disorder, single episode, unspecified: ICD-10-CM

## 2018-10-01 DIAGNOSIS — A4102 Sepsis due to Methicillin resistant Staphylococcus aureus: Secondary | ICD-10-CM

## 2018-10-01 DIAGNOSIS — E872 Acidosis: Secondary | ICD-10-CM

## 2018-10-01 DIAGNOSIS — I2510 Atherosclerotic heart disease of native coronary artery without angina pectoris: Secondary | ICD-10-CM

## 2018-10-01 DIAGNOSIS — I447 Left bundle-branch block, unspecified: Secondary | ICD-10-CM

## 2018-10-01 DIAGNOSIS — L02414 Cutaneous abscess of left upper limb: ICD-10-CM

## 2018-10-01 DIAGNOSIS — R6520 Severe sepsis without septic shock: ICD-10-CM

## 2018-10-01 DIAGNOSIS — G4733 Obstructive sleep apnea (adult) (pediatric): Secondary | ICD-10-CM

## 2018-10-01 DIAGNOSIS — E782 Mixed hyperlipidemia: Secondary | ICD-10-CM

## 2018-10-01 DIAGNOSIS — Z7901 Long term (current) use of anticoagulants: Secondary | ICD-10-CM

## 2018-10-01 DIAGNOSIS — I5023 Acute on chronic systolic (congestive) heart failure: Secondary | ICD-10-CM

## 2018-10-01 DIAGNOSIS — M71122 Other infective bursitis, left elbow: Secondary | ICD-10-CM

## 2018-10-01 DIAGNOSIS — I130 Hypertensive heart and chronic kidney disease with heart failure and stage 1 through stage 4 chronic kidney disease, or unspecified chronic kidney disease: Secondary | ICD-10-CM

## 2018-10-01 DIAGNOSIS — M5442 Lumbago with sciatica, left side: ICD-10-CM

## 2018-10-01 DIAGNOSIS — I255 Ischemic cardiomyopathy: ICD-10-CM

## 2018-10-01 DIAGNOSIS — L03114 Cellulitis of left upper limb: ICD-10-CM

## 2018-10-01 DIAGNOSIS — M00022 Staphylococcal arthritis, left elbow: ICD-10-CM

## 2018-10-01 DIAGNOSIS — J449 Chronic obstructive pulmonary disease, unspecified: ICD-10-CM

## 2018-10-01 DIAGNOSIS — R7303 Prediabetes: ICD-10-CM

## 2018-10-01 DIAGNOSIS — E871 Hypo-osmolality and hyponatremia: ICD-10-CM

## 2018-10-01 DIAGNOSIS — I4891 Unspecified atrial fibrillation: Secondary | ICD-10-CM

## 2018-10-01 LAB — CBC AND DIFF
Lab: 10 K/UL (ref 4.5–11.0)
Lab: 16 % — ABNORMAL HIGH (ref 11–15)
Lab: 29 pg (ref 26–34)
Lab: 36 % — ABNORMAL LOW (ref 40–50)
Lab: 4 M/UL — ABNORMAL LOW (ref 4.4–5.5)

## 2018-10-01 LAB — CULTURE-WOUND/TISSUE/FLUID(AEROBIC ONLY)W/SENSITIVITY

## 2018-10-01 LAB — COMPREHENSIVE METABOLIC PANEL: Lab: 138 MMOL/L (ref 137–147)

## 2018-10-01 LAB — MAGNESIUM: Lab: 2 mg/dL (ref 1.6–2.6)

## 2018-10-01 MED ORDER — OXYCODONE 5 MG PO TAB
5 mg | ORAL_TABLET | ORAL | 0 refills | 6.00000 days | Status: AC | PRN
Start: 2018-10-01 — End: 2018-10-04

## 2018-10-01 MED ORDER — DIGOXIN 125 MCG (0.125 MG) PO TAB
125 ug | ORAL_TABLET | Freq: Every day | ORAL | 3 refills | 30.00000 days | Status: AC
Start: 2018-10-01 — End: 2018-10-04

## 2018-10-01 MED ORDER — IPRATROPIUM BROMIDE 0.02 % IN SOLN
0.5 mg | Freq: Two times a day (BID) | RESPIRATORY_TRACT | 0 refills | Status: DC | PRN
Start: 2018-10-01 — End: 2018-10-01

## 2018-10-01 MED ORDER — METOPROLOL TARTRATE 50 MG PO TAB
50 mg | ORAL_TABLET | Freq: Two times a day (BID) | ORAL | 3 refills | 90.00000 days | Status: AC
Start: 2018-10-01 — End: ?

## 2018-10-01 MED ORDER — CLINDAMYCIN HCL 300 MG PO CAP
600 mg | ORAL_CAPSULE | ORAL | 0 refills | Status: AC
Start: 2018-10-01 — End: 2018-10-04

## 2018-10-01 MED ORDER — ALBUTEROL SULFATE 2.5 MG/0.5 ML IN NEBU
2.5 mg | Freq: Two times a day (BID) | RESPIRATORY_TRACT | 0 refills | Status: DC | PRN
Start: 2018-10-01 — End: 2018-10-01

## 2018-10-01 MED ORDER — FUROSEMIDE 40 MG PO TAB
40 mg | ORAL_TABLET | Freq: Every day | ORAL | 3 refills | 90.00000 days | Status: AC
Start: 2018-10-01 — End: 2018-10-04

## 2018-10-02 LAB — CULTURE-ANAEROBIC

## 2018-10-04 ENCOUNTER — Encounter: Admit: 2018-10-04 | Discharge: 2018-10-04 | Payer: MEDICARE | Primary: Family

## 2018-10-04 ENCOUNTER — Ambulatory Visit: Admit: 2018-10-04 | Discharge: 2018-10-04 | Payer: MEDICARE | Primary: Family

## 2018-10-04 DIAGNOSIS — I739 Peripheral vascular disease, unspecified: Secondary | ICD-10-CM

## 2018-10-04 DIAGNOSIS — I499 Cardiac arrhythmia, unspecified: Secondary | ICD-10-CM

## 2018-10-04 DIAGNOSIS — J449 Chronic obstructive pulmonary disease, unspecified: Secondary | ICD-10-CM

## 2018-10-04 DIAGNOSIS — I519 Heart disease, unspecified: Secondary | ICD-10-CM

## 2018-10-04 DIAGNOSIS — I4891 Unspecified atrial fibrillation: Secondary | ICD-10-CM

## 2018-10-04 DIAGNOSIS — Z720 Tobacco use: Secondary | ICD-10-CM

## 2018-10-04 DIAGNOSIS — I420 Dilated cardiomyopathy: Secondary | ICD-10-CM

## 2018-10-04 DIAGNOSIS — J430 Unilateral pulmonary emphysema [MacLeod's syndrome]: Secondary | ICD-10-CM

## 2018-10-04 DIAGNOSIS — Z87891 Personal history of nicotine dependence: Secondary | ICD-10-CM

## 2018-10-04 DIAGNOSIS — I10 Essential (primary) hypertension: Secondary | ICD-10-CM

## 2018-10-04 DIAGNOSIS — M79602 Pain in left arm: Secondary | ICD-10-CM

## 2018-10-04 DIAGNOSIS — M7022 Olecranon bursitis, left elbow: Secondary | ICD-10-CM

## 2018-10-04 DIAGNOSIS — E669 Obesity, unspecified: Secondary | ICD-10-CM

## 2018-10-04 DIAGNOSIS — I2510 Atherosclerotic heart disease of native coronary artery without angina pectoris: Secondary | ICD-10-CM

## 2018-10-04 DIAGNOSIS — G4733 Obstructive sleep apnea (adult) (pediatric): Secondary | ICD-10-CM

## 2018-10-04 DIAGNOSIS — J984 Other disorders of lung: Secondary | ICD-10-CM

## 2018-10-12 ENCOUNTER — Encounter: Admit: 2018-10-12 | Discharge: 2018-10-12 | Payer: MEDICARE | Primary: Family

## 2018-10-12 DIAGNOSIS — I420 Dilated cardiomyopathy: Secondary | ICD-10-CM

## 2018-10-12 DIAGNOSIS — Z720 Tobacco use: Secondary | ICD-10-CM

## 2018-10-12 DIAGNOSIS — I10 Essential (primary) hypertension: Secondary | ICD-10-CM

## 2018-10-12 DIAGNOSIS — J984 Other disorders of lung: Secondary | ICD-10-CM

## 2018-10-12 DIAGNOSIS — G4733 Obstructive sleep apnea (adult) (pediatric): Secondary | ICD-10-CM

## 2018-10-12 DIAGNOSIS — I4891 Unspecified atrial fibrillation: Secondary | ICD-10-CM

## 2018-10-12 DIAGNOSIS — I739 Peripheral vascular disease, unspecified: Secondary | ICD-10-CM

## 2018-10-12 DIAGNOSIS — Z87891 Personal history of nicotine dependence: Secondary | ICD-10-CM

## 2018-10-12 DIAGNOSIS — I2510 Atherosclerotic heart disease of native coronary artery without angina pectoris: Secondary | ICD-10-CM

## 2018-10-12 DIAGNOSIS — I499 Cardiac arrhythmia, unspecified: Secondary | ICD-10-CM

## 2018-10-12 DIAGNOSIS — J449 Chronic obstructive pulmonary disease, unspecified: Secondary | ICD-10-CM

## 2018-10-12 DIAGNOSIS — E669 Obesity, unspecified: Secondary | ICD-10-CM

## 2018-10-12 DIAGNOSIS — I519 Heart disease, unspecified: Secondary | ICD-10-CM

## 2018-10-13 ENCOUNTER — Ambulatory Visit: Admit: 2018-10-12 | Discharge: 2018-10-13 | Payer: MEDICARE | Primary: Family

## 2018-10-13 DIAGNOSIS — M7022 Olecranon bursitis, left elbow: Secondary | ICD-10-CM

## 2018-10-23 LAB — CULTURE-FUNGAL,OTHER

## 2018-10-25 ENCOUNTER — Encounter: Admit: 2018-10-25 | Discharge: 2018-10-25 | Payer: MEDICARE | Primary: Family

## 2018-10-30 LAB — CULTURE-FUNGAL,OTHER

## 2018-11-02 ENCOUNTER — Encounter: Admit: 2018-11-02 | Discharge: 2018-11-02 | Payer: MEDICARE | Primary: Family

## 2018-11-02 ENCOUNTER — Ambulatory Visit: Admit: 2018-11-02 | Discharge: 2018-11-02 | Payer: MEDICARE | Primary: Family

## 2018-11-02 DIAGNOSIS — M25552 Pain in left hip: Principal | ICD-10-CM

## 2018-11-02 DIAGNOSIS — M7022 Olecranon bursitis, left elbow: ICD-10-CM

## 2018-11-13 LAB — CULTURE-TB (AFB)

## 2018-12-27 ENCOUNTER — Encounter: Admit: 2018-12-27 | Discharge: 2018-12-27 | Payer: MEDICARE | Primary: Family

## 2018-12-27 DIAGNOSIS — M549 Dorsalgia, unspecified: Principal | ICD-10-CM

## 2019-03-27 ENCOUNTER — Encounter: Admit: 2019-03-27 | Discharge: 2019-03-27 | Primary: Family

## 2019-03-27 NOTE — Telephone Encounter
Pre Visit Planning- New Patient    Records received: Yes    Orders have been ordered    Patient active in [facility]. No appointment reminder sent. .     Left voicemail message.    Updated chart: Not assessed    X-ray PACS

## 2019-03-28 ENCOUNTER — Encounter: Admit: 2019-03-28 | Discharge: 2019-03-28 | Primary: Family

## 2019-04-09 ENCOUNTER — Encounter: Admit: 2019-04-09 | Discharge: 2019-04-09 | Primary: Family

## 2019-04-09 DIAGNOSIS — M549 Dorsalgia, unspecified: Secondary | ICD-10-CM

## 2019-04-10 ENCOUNTER — Encounter: Admit: 2019-04-10 | Discharge: 2019-04-10 | Primary: Family

## 2019-04-12 NOTE — Telephone Encounter
Attempt to contact pt to abstract chart. Unable to reach. LVM

## 2019-05-28 ENCOUNTER — Encounter: Admit: 2019-05-28 | Discharge: 2019-05-28 | Primary: Family

## 2019-05-28 ENCOUNTER — Emergency Department: Admit: 2019-05-28 | Discharge: 2019-05-28

## 2019-05-28 ENCOUNTER — Emergency Department: Admit: 2019-05-28 | Discharge: 2019-05-29 | Disposition: A | Discharge status: Home / Self Care

## 2019-05-28 DIAGNOSIS — J984 Other disorders of lung: Secondary | ICD-10-CM

## 2019-05-28 DIAGNOSIS — I420 Dilated cardiomyopathy: Secondary | ICD-10-CM

## 2019-05-28 DIAGNOSIS — I2510 Atherosclerotic heart disease of native coronary artery without angina pectoris: Secondary | ICD-10-CM

## 2019-05-28 DIAGNOSIS — I499 Cardiac arrhythmia, unspecified: Secondary | ICD-10-CM

## 2019-05-28 DIAGNOSIS — I10 Essential (primary) hypertension: Secondary | ICD-10-CM

## 2019-05-28 DIAGNOSIS — I519 Heart disease, unspecified: Secondary | ICD-10-CM

## 2019-05-28 DIAGNOSIS — Z720 Tobacco use: Secondary | ICD-10-CM

## 2019-05-28 DIAGNOSIS — Z87891 Personal history of nicotine dependence: Secondary | ICD-10-CM

## 2019-05-28 DIAGNOSIS — I739 Peripheral vascular disease, unspecified: Secondary | ICD-10-CM

## 2019-05-28 DIAGNOSIS — J449 Chronic obstructive pulmonary disease, unspecified: Secondary | ICD-10-CM

## 2019-05-28 DIAGNOSIS — E669 Obesity, unspecified: Secondary | ICD-10-CM

## 2019-05-28 DIAGNOSIS — G4733 Obstructive sleep apnea (adult) (pediatric): Secondary | ICD-10-CM

## 2019-05-28 DIAGNOSIS — I4891 Unspecified atrial fibrillation: Secondary | ICD-10-CM

## 2019-05-28 DIAGNOSIS — S2231XA Fracture of one rib, right side, initial encounter for closed fracture: Secondary | ICD-10-CM

## 2019-05-28 LAB — POC CREATININE, RAD: Lab: 1 mg/dL (ref 0.4–1.24)

## 2019-05-28 LAB — CBC AND DIFF: Lab: 6.1 10*3/uL (ref 4.5–11.0)

## 2019-05-28 LAB — POC TROPONIN: Lab: 0 ng/mL (ref 0.00–0.05)

## 2019-05-28 LAB — BNP POC ER: Lab: 339 pg/mL — ABNORMAL HIGH (ref 0–100)

## 2019-05-28 MED ORDER — IOHEXOL 350 MG IODINE/ML IV SOLN
100 mL | Freq: Once | INTRAVENOUS | 0 refills | Status: CP
Start: 2019-05-28 — End: ?
  Administered 2019-05-29: 01:00:00 100 mL via INTRAVENOUS

## 2019-05-28 MED ORDER — SODIUM CHLORIDE 0.9 % IJ SOLN
50 mL | Freq: Once | INTRAVENOUS | 0 refills | Status: CP
Start: 2019-05-28 — End: ?
  Administered 2019-05-29: 01:00:00 50 mL via INTRAVENOUS

## 2019-05-28 MED ORDER — FENTANYL CITRATE (PF) 50 MCG/ML IJ SOLN
50 ug | Freq: Once | INTRAVENOUS | 0 refills | Status: CP
Start: 2019-05-28 — End: ?
  Administered 2019-05-29: 02:00:00 50 ug via INTRAVENOUS

## 2019-05-28 MED ORDER — HYDRALAZINE 20 MG/ML IJ SOLN
10 mg | Freq: Once | INTRAVENOUS | 0 refills | Status: CP
Start: 2019-05-28 — End: ?
  Administered 2019-05-29: 03:00:00 10 mg via INTRAVENOUS

## 2019-05-29 DIAGNOSIS — I4891 Unspecified atrial fibrillation: Secondary | ICD-10-CM

## 2019-05-29 DIAGNOSIS — I10 Essential (primary) hypertension: Secondary | ICD-10-CM

## 2019-05-29 DIAGNOSIS — M549 Dorsalgia, unspecified: Secondary | ICD-10-CM

## 2019-05-29 DIAGNOSIS — S2231XA Fracture of one rib, right side, initial encounter for closed fracture: Secondary | ICD-10-CM

## 2019-05-29 LAB — COMPREHENSIVE METABOLIC PANEL
Lab: 0.6 mg/dL (ref 0.3–1.2)
Lab: 102 MMOL/L (ref 98–110)
Lab: 141 U/L — ABNORMAL HIGH (ref 25–110)
Lab: 142 MMOL/L (ref 137–147)
Lab: 15 U/L (ref 7–40)
Lab: 3.6 g/dL (ref 3.5–5.0)
Lab: 31 MMOL/L — ABNORMAL HIGH (ref 21–30)
Lab: 4.1 MMOL/L (ref 3.5–5.1)
Lab: 6.9 g/dL (ref 6.0–8.0)
Lab: 9.4 mg/dL — ABNORMAL HIGH (ref 8.5–10.6)
Lab: 95 mg/dL (ref 70–100)

## 2019-05-29 LAB — PTT (APTT): Lab: 33 s (ref 24.0–36.5)

## 2019-05-29 LAB — PROTIME INR (PT): Lab: 1.1 mg/dL (ref 0.8–1.2)

## 2019-05-29 NOTE — ED Notes
Pt to CT via cart, escorted by RN.

## 2019-05-29 NOTE — ED Notes
Pt able to ambulate with steady gait. Pt reports he will use his walker at home.

## 2019-05-29 NOTE — ED Notes
Pt A&Ox4 and given discharge instructions and follow up information. Pt educated on HTN and need to follow up with PCP. Pt verbalizes understanding and has no further questions or complaints. VSS. Pt ambulated out of ED w/ steady gait to go home by POV accompanied by wife who is pt's ride home.

## 2019-05-29 NOTE — ED Notes
PIV attempt x2 without success. Shelton Silvas, RN consulted for US guided PIV placement.

## 2019-05-29 NOTE — ED Notes
Pt complaining of 9/10 right hip/leg pain at this time. Pt is more alert and oriented than he was previously. Pt is hypertensive, Dr. Fredirick Maudlin aware. MD notified of pt's pain as well.

## 2020-08-01 IMAGING — CR CHEST
2 series · 2 of 2 positions shown · non-contrast
Comparison: none

[chest port x-wise (1 of 2)]
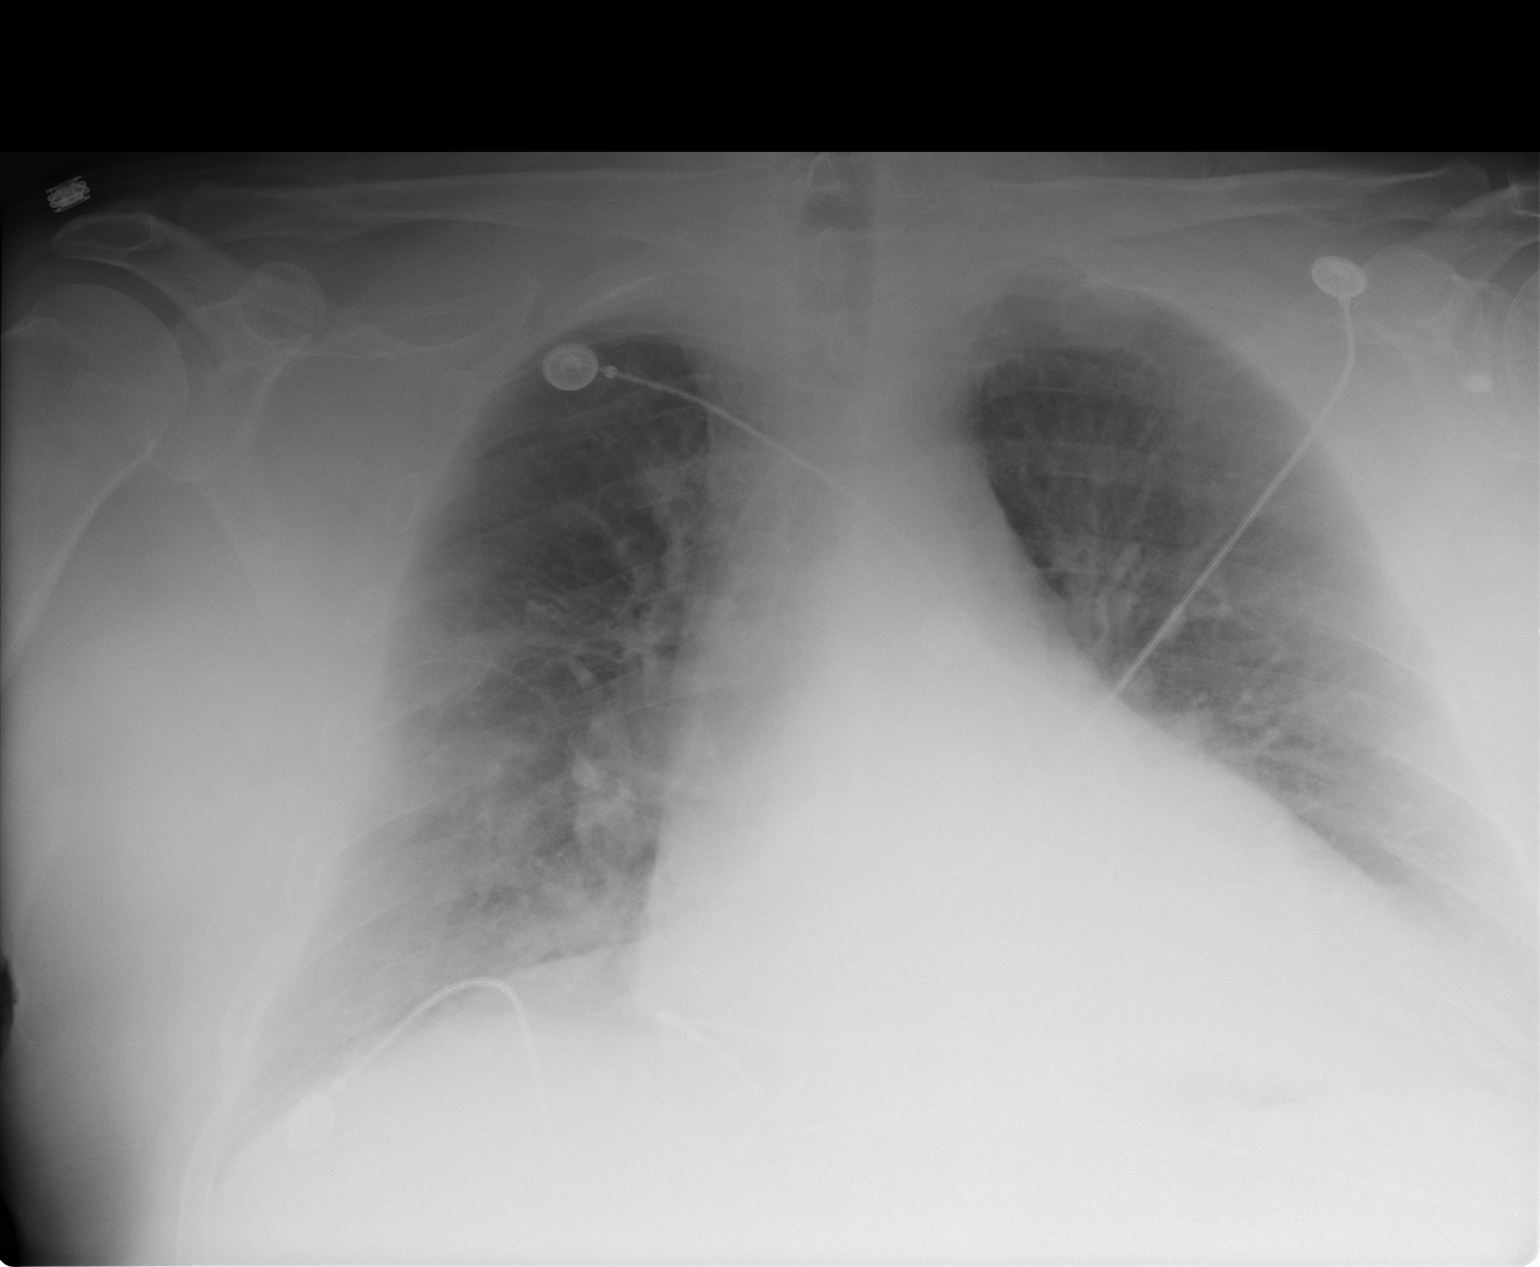

[chest port x-wise (2 of 2)]
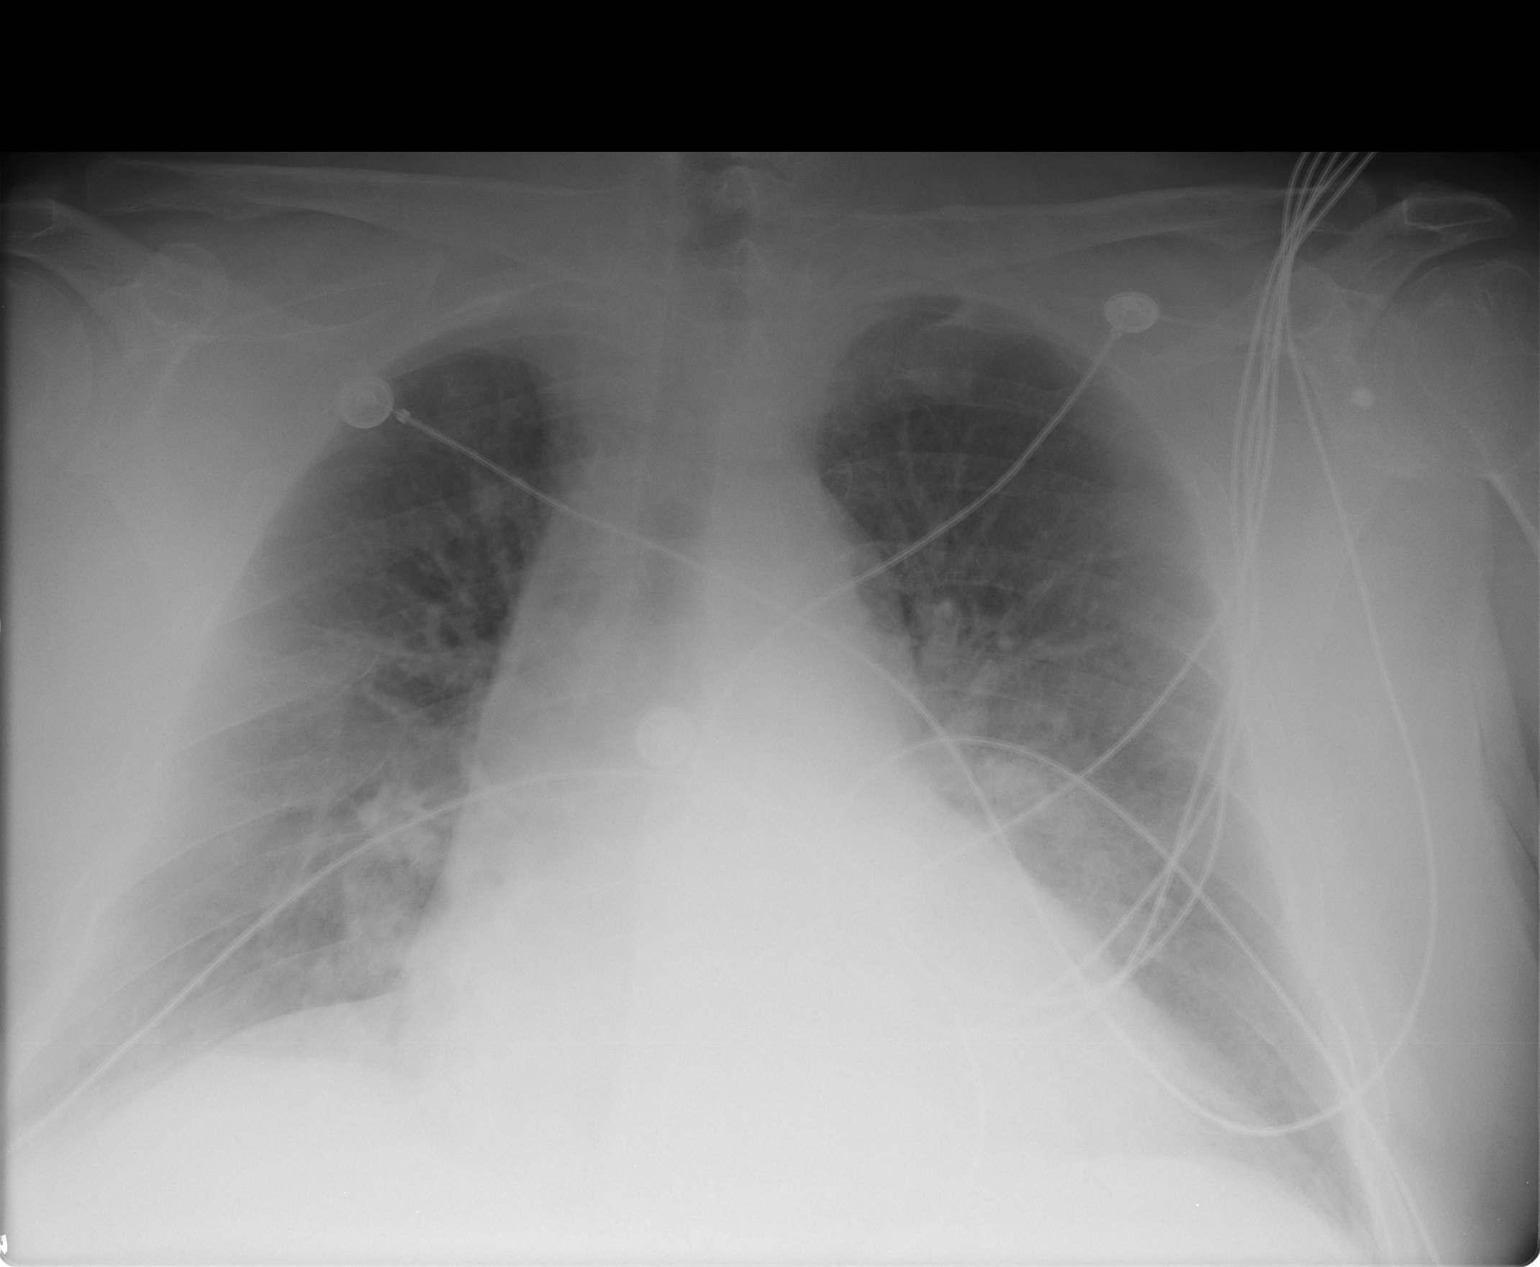

[2 of 2 positions shown; findings below may reference images not displayed]

DIAGNOSTIC STUDIES

EXAM
RADIOLOGICAL EXAMINATION, CHEST; SINGLE VIEW, FRONTAL CPT 65050

INDICATION
fever sob
fever; soa; hx of htn and copd

TECHNIQUE
Single AP portable view of the chest was performed.

COMPARISONS
08/04/2018

FINDINGS
[Cardiomegaly. Mild cephalization. Focal lingular opacity. No acute fracture. Limited evaluation of
the thoracic spine.

IMPRESSION
Cardiomegaly with mild pulmonary venous congestion.
Focal lingular opacity, suspicious for pneumonia.

Tech Notes:

fever; soa; hx of htn and copd

## 2020-08-02 IMAGING — CR CHEST
1 series · 1 of 1 positions shown · non-contrast
Comparison: none

[chest port x-wise]
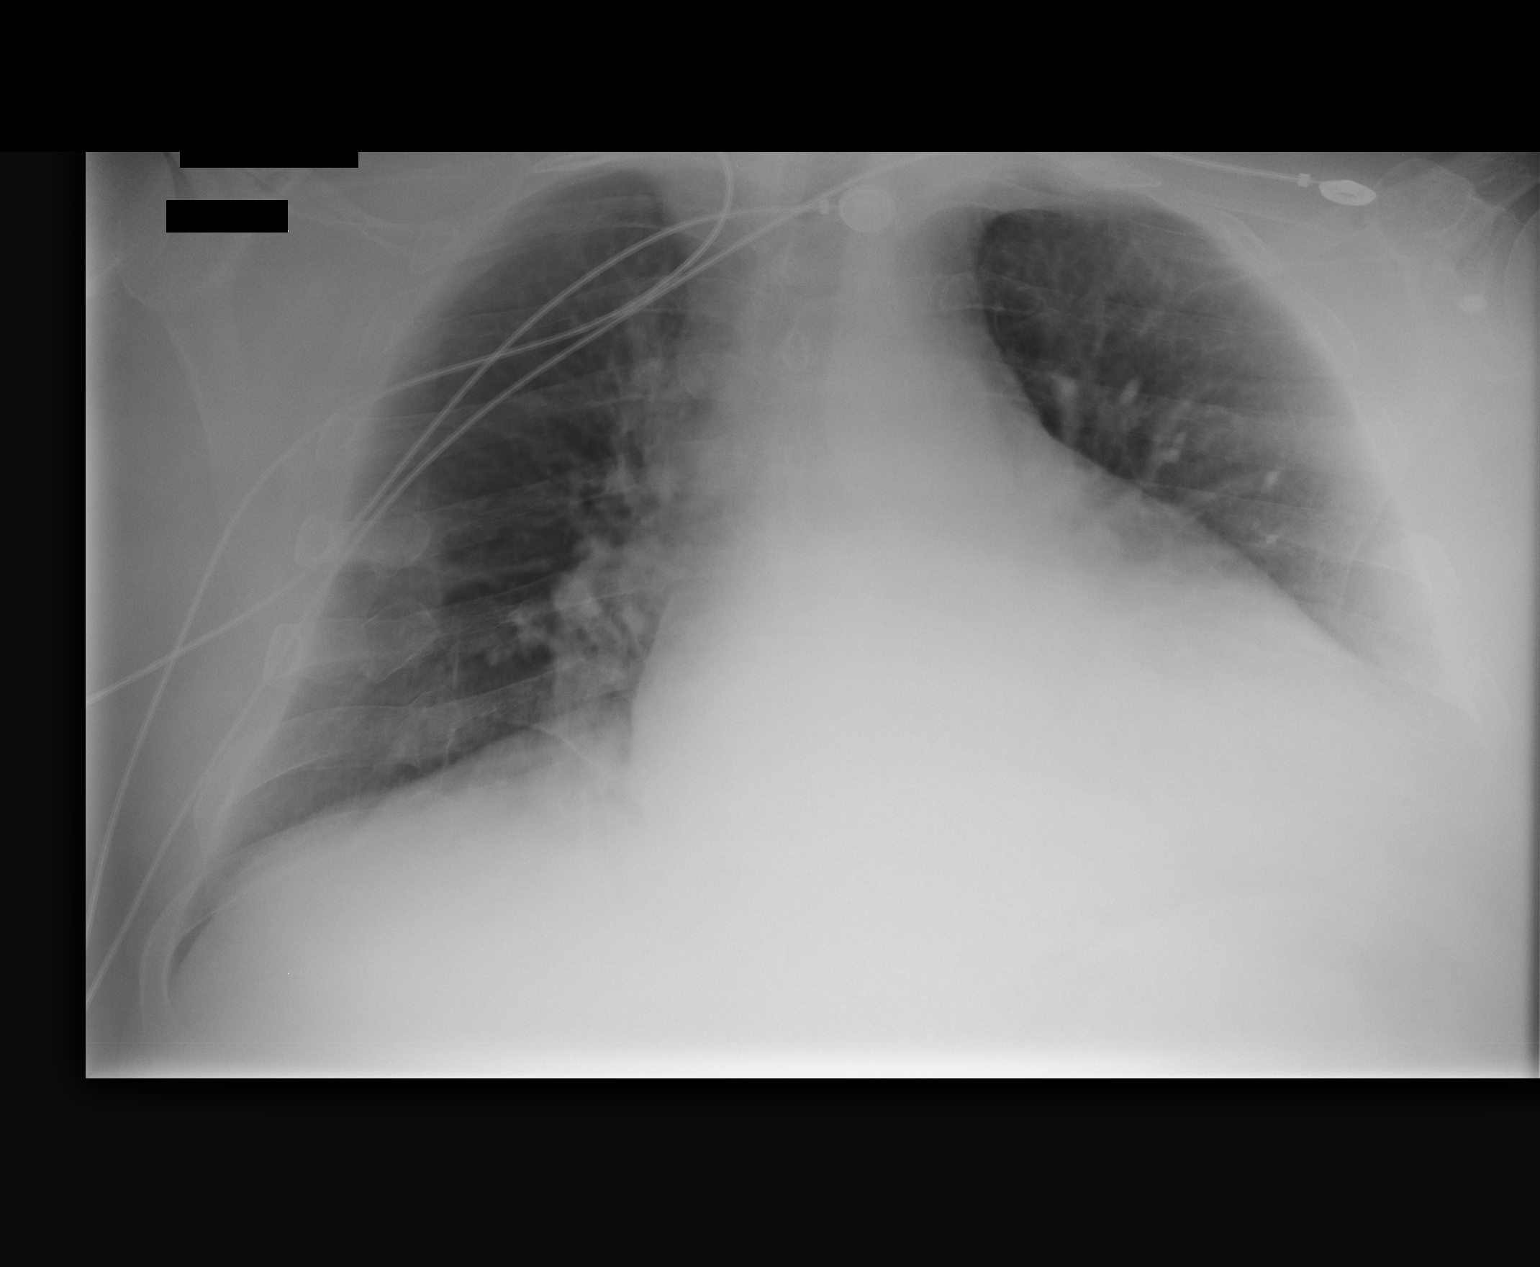

[1 of 1 positions shown; findings below may reference images not displayed]

EXAM

Chest 1 view.

INDICATION

Pneumonia, shortness of breath.

FINDINGS

Comparison made to a study August 18, 2018.

The heart is prominently enlarged.

On this study no failure or dominant effusion is identified.

The right hilum is unchanged and prominent. Left hilum is obscured on this study.

IMPRESSION

There is cardiomegaly. I see no interstitial edema or dominant effusions.

Tech Notes:

f/u pna - AK

## 2020-08-20 IMAGING — CR CHEST
1 series · 1 of 1 positions shown · non-contrast
Comparison: none

[chest ap grid]
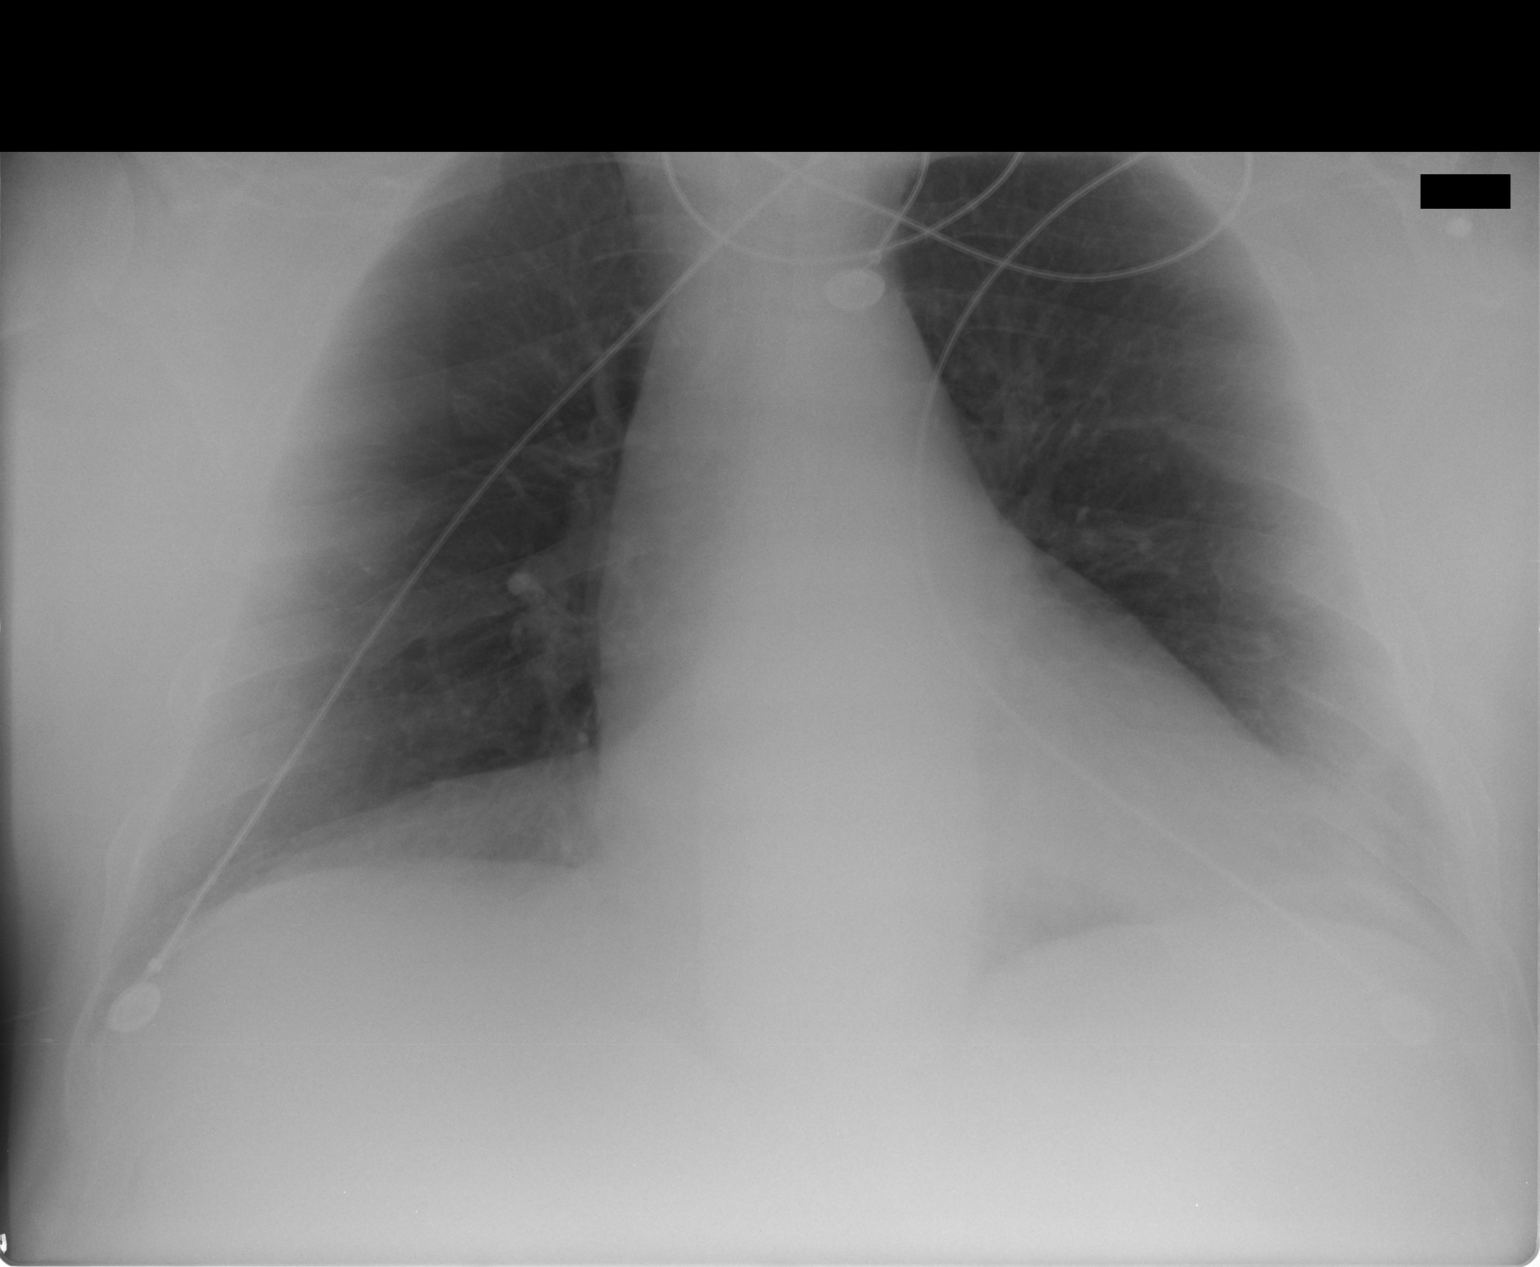

[1 of 1 positions shown; findings below may reference images not displayed]

DIAGNOSTIC STUDIES

EXAM
Chest radiograph.

INDICATION
weakness
Pt c/o of weakness and confusion. AW

TECHNIQUE
AP chest.

COMPARISONS
August 19, 2018.

FINDINGS
The lungs are clear without consolidative airspace disease, pleural effusion, or pneumothorax. The
cardiomediastinal silhouette is not enlarged.

IMPRESSION
No evidence of acute cardiopulmonary pathology.

Tech Notes:

Pt c/o of weakness and confusion. AW

## 2023-09-22 ENCOUNTER — Encounter: Admit: 2023-09-22 | Discharge: 2023-09-22 | Payer: MEDICARE

## 2023-10-10 ENCOUNTER — Encounter: Admit: 2023-10-10 | Discharge: 2023-10-10 | Payer: MEDICARE

## 2023-10-10 ENCOUNTER — Inpatient Hospital Stay: Admit: 2023-10-10 | Discharge: 2023-10-10 | Payer: MEDICARE

## 2023-10-10 ENCOUNTER — Emergency Department
Admit: 2023-10-10 | Discharge: 2023-10-10 | Payer: MEDICARE | Attending: Student in an Organized Health Care Education/Training Program

## 2023-10-10 ENCOUNTER — Inpatient Hospital Stay
Admit: 2023-10-10 | Discharge: 2023-10-27 | Disposition: A | Discharge status: Hospice | Payer: MEDICARE | Attending: Student in an Organized Health Care Education/Training Program | Admitting: Student in an Organized Health Care Education/Training Program

## 2023-10-10 DIAGNOSIS — D62 Acute posthemorrhagic anemia: Secondary | ICD-10-CM

## 2023-10-10 DIAGNOSIS — D72829 Elevated white blood cell count, unspecified: Secondary | ICD-10-CM

## 2023-10-10 DIAGNOSIS — A419 Sepsis, unspecified organism: Secondary | ICD-10-CM

## 2023-10-10 DIAGNOSIS — R197 Diarrhea, unspecified: Secondary | ICD-10-CM

## 2023-10-10 DIAGNOSIS — K559 Vascular disorder of intestine, unspecified: Secondary | ICD-10-CM

## 2023-10-10 DIAGNOSIS — Z9049 Acquired absence of other specified parts of digestive tract: Secondary | ICD-10-CM

## 2023-10-10 DIAGNOSIS — I4891 Unspecified atrial fibrillation: Secondary | ICD-10-CM

## 2023-10-10 DIAGNOSIS — R1084 Generalized abdominal pain: Secondary | ICD-10-CM

## 2023-10-10 DIAGNOSIS — S82402A Unspecified fracture of shaft of left fibula, initial encounter for closed fracture: Secondary | ICD-10-CM

## 2023-10-10 LAB — COMPREHENSIVE METABOLIC PANEL
~~LOC~~ BKR ALBUMIN: 3.8 g/dL — AB (ref 3.5–5.0)
~~LOC~~ BKR ALK PHOSPHATASE: 122 U/L — ABNORMAL HIGH (ref 25–110)
~~LOC~~ BKR ALT: 10 U/L (ref 7–56)
~~LOC~~ BKR ANION GAP: 16 10*3/uL — ABNORMAL HIGH (ref 3–12)
~~LOC~~ BKR AST: 23 U/L (ref 7–40)
~~LOC~~ BKR CALCIUM: 9.1 mg/dL — AB (ref 8.5–10.6)
~~LOC~~ BKR CHLORIDE: 95 mmol/L — ABNORMAL LOW (ref 98–110)
~~LOC~~ BKR CO2: 24 mmol/L (ref 21–30)
~~LOC~~ BKR CREATININE: 1.1 mg/dL (ref 0.40–1.24)
~~LOC~~ BKR GLOMERULAR FILTRATION RATE (GFR): >60 mL/min (ref >60–4.80)
~~LOC~~ BKR POTASSIUM: 4.5 mmol/L (ref 3.5–5.1)
~~LOC~~ BKR TOTAL BILIRUBIN: 1.3 mg/dL (ref 0.2–1.3)
~~LOC~~ BKR TOTAL PROTEIN: 7.5 g/dL — AB (ref 6.0–8.0)

## 2023-10-10 LAB — MAGNESIUM: ~~LOC~~ BKR MAGNESIUM: 1.6 mg/dL (ref 1.6–2.6)

## 2023-10-10 LAB — 2D + DOPPLER ECHO
AORTIC VALVE STROKE VOLUME INDEX: 27
AV INDEX (NATIVE): 0.7
AV PEAK VELOCITY: 1.2 m/s
BSA: 2.4 m2
DOP CALC LVOT AREA: 4.1 cm2
DOP CALC LVOT DIAMETER: 2.3 cm
DOP CALC LVOT PEAK VEL VTI: 14 cm
DOP CALC LVOT PEAK VEL: 0.9 m/s
DOP CALC LVOT STROKE VOLUME: 60 cm3
DOP CALC MV VTI: 23 cm
ECHO EF: 55 %
EJECTION FRACTION: 61 %
FRACTIONAL SHORTENING: 36 % (ref 28–44)
INTERVENTRICULAR SEPTUM: 1.3 cm (ref 0.6–1)
LATERAL E/E' RATIO: 13
LEFT ATRIUM INDEX: 30 mL/m2 (ref 16–34)
LEFT ATRIUM SIZE: 5.4 cm (ref 3–4)
LEFT ATRIUM VOLUME: 73 mL (ref 18–58)
LEFT INTERNAL DIMENSION IN SYSTOLE: 3.7 cm (ref 2.5–4)
LEFT VENTRICLE DIASTOLIC VOLUME INDEX: 72 mL/m2 (ref 34–74)
LEFT VENTRICLE DIASTOLIC VOLUME: 177 mL (ref 62–150)
LEFT VENTRICLE MASS INDEX: 135 g/m2 (ref 49–115)
LEFT VENTRICLE SYSTOLIC VOLUME INDEX: 40 mL/m2 (ref 11–31)
LEFT VENTRICLE SYSTOLIC VOLUME: 97 mL (ref 21–61)
LEFT VENTRICULAR INTERNAL DIMENSION IN DIASTOLE: 5.8 cm (ref 4.2–5.8)
LEFT VENTRICULAR MASS: 331 g (ref 88–224)
MEDIAL E/E' RATIO: 14
MV MEAN GRADIENT: 3 mmHg
MV PEAK E VEL PW: 1.1 m/s
MV STENOSIS PRESSURE HALF TIME: 55 ms
MV VALVE AREA BY CONTINUITY EQUATION: 2.5 cm2
MV VALVE AREA P 1/2 METHOD: 4 cm2
POSTERIOR WALL: 1.3 cm (ref 0.6–1)
PROX AORTA: 3.9 cm (ref 2.6–3.8)
RA PRESSURE: 3
RELATIVE WALL THICKNESS: 0.4 (ref <=0.42)
RIGHT ATRIAL AREA: 26 cm2 (ref <18)
RIGHT HEART SYSTOLIC MMODE TAPSE: 1.5 cm (ref >1.7)
RIGHT HEART SYSTOLIC TDI S': 0.1 m/s
RIGHT VENTRICULAR BASAL DIAMETER: 4.5 cm (ref 2.5–4.1)
RIGHT VENTRICULAR MID DIAMETER: 3.4 cm (ref 1.9–3.5)
SINUS: 3.8 cm (ref 2.8–4)
STJ: 3 cm (ref 2.3–3.5)
TDI LATERAL E': 0 m/s
TDI MEDIAL E': 0 m/s

## 2023-10-10 LAB — POC LACTATE: ~~LOC~~ BKR POC LACTIC ACID: 2.5 mmol/L — ABNORMAL HIGH (ref 0.5–2.0)

## 2023-10-10 LAB — KC ED MAIN ECG TRIAGE ONLY
Q-T INTERVAL: 358 ms
QRS DURATION: 116 ms
QTC CALCULATION (BAZETT): 426 ms
R AXIS: -29 degrees
T AXIS: 96 degrees
VENTRICULAR RATE: 85 {beats}/min

## 2023-10-10 LAB — NT-PRO-BNP: ~~LOC~~ BKR NT-PRO-BNP: 111 pg/mL — ABNORMAL HIGH (ref <125)

## 2023-10-10 LAB — CBC AND DIFF
~~LOC~~ BKR ABSOLUTE BASO COUNT: 0.1 10*3/uL (ref 0.00–0.20)
~~LOC~~ BKR ABSOLUTE EOS COUNT: 0.1 10*3/uL (ref 0.00–0.45)
~~LOC~~ BKR MCH: 28 pg (ref 26.0–34.0)
~~LOC~~ BKR MDW (MONOCYTE DISTRIBUTION WIDTH): 21 ms — ABNORMAL HIGH (ref <=20.6)
~~LOC~~ BKR RBC COUNT: 5.4 10*6/uL — ABNORMAL LOW (ref 4.40–5.50)
~~LOC~~ BKR WBC COUNT: 18 10*3/uL — ABNORMAL HIGH (ref 4.50–11.00)

## 2023-10-10 LAB — PTT (APTT)
~~LOC~~ BKR PTT: 34 s (ref 24.0–36.5)
~~LOC~~ BKR PTT: 67 s — ABNORMAL HIGH (ref 24.0–36.5)

## 2023-10-10 LAB — CRYPTOSPORIDIUM, FECAL: ~~LOC~~ BKR CRYPTO EIA: NEGATIVE

## 2023-10-10 LAB — URINALYSIS DIPSTICK REFLEX TO CULTURE

## 2023-10-10 LAB — LACTIC ACID(LACTATE): ~~LOC~~ BKR LACTIC ACID: 1.2 mmol/L (ref 0.5–2.0)

## 2023-10-10 LAB — C DIFFICILE BY PCR: ~~LOC~~ BKR C DIFF TOXIN B: NEGATIVE %

## 2023-10-10 LAB — PHOSPHORUS: ~~LOC~~ BKR PHOSPHORUS: 3.5 mg/dL (ref 2.0–4.5)

## 2023-10-10 LAB — POC CREATININE: ~~LOC~~ BKR POC CREATININE: 1.4 — ABNORMAL HIGH (ref 0.4–1.24)

## 2023-10-10 MED ORDER — ROCURONIUM 10 MG/ML IV SOLN
.6 mg/kg | Freq: Once | INTRAVENOUS | 0 refills | Status: CP
Start: 2023-10-10 — End: ?

## 2023-10-10 MED ORDER — UMECLIDINIUM-VILANTEROL 62.5-25 MCG/ACTUATION IN DSDV
1 | Freq: Every day | RESPIRATORY_TRACT | 0 refills | Status: DC
Start: 2023-10-10 — End: 2023-10-11
  Administered 2023-10-10: 15:00:00 1 via RESPIRATORY_TRACT

## 2023-10-10 MED ORDER — METOPROLOL TARTRATE 5 MG/5 ML IV SOLN
7.5 mg | INTRAVENOUS | 0 refills | Status: DC
Start: 2023-10-10 — End: 2023-10-14
  Administered 2023-10-11 (×2): 7.5 mg via INTRAVENOUS

## 2023-10-10 MED ORDER — POTASSIUM CHLORIDE IN WATER 10 MEQ/50 ML IV PGBK
10 meq | INTRAVENOUS | 0 refills | Status: CP
Start: 2023-10-10 — End: ?
  Administered 2023-10-11: 06:00:00 10 meq via INTRAVENOUS

## 2023-10-10 MED ORDER — ATORVASTATIN 40 MG PO TAB
40 mg | Freq: Every day | ORAL | 0 refills | Status: DC
Start: 2023-10-10 — End: 2023-10-14
  Administered 2023-10-10: 14:00:00 40 mg via ORAL

## 2023-10-10 MED ORDER — SODIUM CHLORIDE 0.9 % IJ SOLN
10 mL | Freq: Once | INTRAVENOUS | 0 refills | Status: CP
Start: 2023-10-10 — End: ?
  Administered 2023-10-10: 18:00:00 10 mL via INTRAVENOUS

## 2023-10-10 MED ORDER — LACTATED RINGERS IV SOLP
1000 mL | INTRAVENOUS | 0 refills | Status: CP
Start: 2023-10-10 — End: ?
  Administered 2023-10-10: 09:00:00 1000 mL via INTRAVENOUS

## 2023-10-10 MED ORDER — MIDAZOLAM 1 MG/ML IJ SOLN
2 mg | Freq: Once | INTRAVENOUS | 0 refills | Status: CP
Start: 2023-10-10 — End: ?

## 2023-10-10 MED ORDER — MORPHINE 2 MG/ML IV SYRG
2-4 mg | INTRAVENOUS | 0 refills | Status: DC | PRN
Start: 2023-10-10 — End: 2023-10-11
  Administered 2023-10-10: 21:00:00 4 mg via INTRAVENOUS

## 2023-10-10 MED ORDER — ONDANSETRON 4 MG PO TBDI
4 mg | ORAL | 0 refills | Status: DC | PRN
Start: 2023-10-10 — End: 2023-10-19

## 2023-10-10 MED ORDER — PANTOPRAZOLE 40 MG IV SOLR
40 mg | Freq: Every day | INTRAVENOUS | 0 refills | Status: DC
Start: 2023-10-10 — End: 2023-10-25
  Administered 2023-10-11 – 2023-10-25 (×15): 40 mg via INTRAVENOUS

## 2023-10-10 MED ORDER — DIGOXIN 125 MCG (0.125 MG) PO TAB
125 ug | Freq: Every day | ORAL | 0 refills | Status: DC
Start: 2023-10-10 — End: 2023-10-11
  Administered 2023-10-10: 14:00:00 125 ug via ORAL

## 2023-10-10 MED ORDER — HALOPERIDOL LACTATE 5 MG/ML IJ SOLN
1 mg | Freq: Once | INTRAVENOUS | 0 refills | Status: DC | PRN
Start: 2023-10-10 — End: 2023-10-11

## 2023-10-10 MED ORDER — BUPIVACAINE-EPINEPHRINE 0.5 %-1:200,000 IJ SOLN
0 refills | Status: DC
Start: 2023-10-10 — End: 2023-10-11

## 2023-10-10 MED ORDER — RIVAROXABAN 20 MG PO TAB
20 mg | Freq: Every day | ORAL | 0 refills | Status: CN
Start: 2023-10-10 — End: ?

## 2023-10-10 MED ORDER — IMS MIXTURE TEMPLATE
200 mg | Freq: Three times a day (TID) | ORAL | 0 refills | Status: DC
Start: 2023-10-10 — End: 2023-10-14
  Administered 2023-10-10 (×2): 200 mg via ORAL

## 2023-10-10 MED ORDER — IOHEXOL 350 MG IODINE/ML IV SOLN
100 mL | Freq: Once | INTRAVENOUS | 0 refills | Status: CP
Start: 2023-10-10 — End: ?
  Administered 2023-10-10: 09:00:00 100 mL via INTRAVENOUS

## 2023-10-10 MED ORDER — OXYCODONE 5 MG PO TAB
5 mg | Freq: Once | ORAL | 0 refills | Status: DC | PRN
Start: 2023-10-10 — End: 2023-10-11

## 2023-10-10 MED ORDER — AMLODIPINE 5 MG PO TAB
5 mg | Freq: Every day | ORAL | 0 refills | Status: DC
Start: 2023-10-10 — End: 2023-10-14
  Administered 2023-10-10: 14:00:00 5 mg via ORAL

## 2023-10-10 MED ORDER — IPRATROPIUM-ALBUTEROL 0.5 MG-3 MG(2.5 MG BASE)/3 ML IN NEBU
3 mL | RESPIRATORY_TRACT | 0 refills | Status: DC | PRN
Start: 2023-10-10 — End: 2023-10-26
  Administered 2023-10-21 – 2023-10-24 (×2): 3 mL via RESPIRATORY_TRACT

## 2023-10-10 MED ORDER — FENTANYL CITRATE (PF) 50 MCG/ML IJ SOLN
50 ug | INTRAVENOUS | 0 refills | Status: DC | PRN
Start: 2023-10-10 — End: 2023-10-11
  Administered 2023-10-11: 03:00:00 50 ug via INTRAVENOUS

## 2023-10-10 MED ORDER — BUSPIRONE 5 MG PO TAB
15 mg | Freq: Three times a day (TID) | ORAL | 0 refills | Status: DC
Start: 2023-10-10 — End: 2023-10-14
  Administered 2023-10-10: 22:00:00 15 mg via ORAL

## 2023-10-10 MED ORDER — FENTANYL CITRATE (PF) 50 MCG/ML IJ SOLN
50 ug | Freq: Once | INTRAVENOUS | 0 refills | Status: CP
Start: 2023-10-10 — End: ?
  Administered 2023-10-10: 11:00:00 50 ug via INTRAVENOUS

## 2023-10-10 MED ORDER — PANTOPRAZOLE 40 MG PO TBEC
40 mg | Freq: Every day | ORAL | 0 refills | Status: DC
Start: 2023-10-10 — End: 2023-10-11
  Administered 2023-10-10: 22:00:00 40 mg via ORAL

## 2023-10-10 MED ORDER — FENTANYL CITRATE (PF) 50 MCG/ML IJ SOLN
100 ug | Freq: Once | INTRAVENOUS | 0 refills | Status: CP
Start: 2023-10-10 — End: ?
  Administered 2023-10-10: 18:00:00 100 ug via INTRAVENOUS

## 2023-10-10 MED ORDER — MAGNESIUM SULFATE IN WATER 4 GRAM/50 ML (8 %) IV PGBK
4 g | Freq: Once | INTRAVENOUS | 0 refills | Status: CP
Start: 2023-10-10 — End: ?
  Administered 2023-10-11: 06:00:00 4 g via INTRAVENOUS

## 2023-10-10 MED ORDER — PERFLUTREN LIPID MICROSPHERES 1.1 MG/ML IV SUSP
1-10 mL | Freq: Once | INTRAVENOUS | 0 refills | Status: CP | PRN
Start: 2023-10-10 — End: ?
  Administered 2023-10-10: 18:00:00 2 mL via INTRAVENOUS

## 2023-10-10 MED ORDER — SODIUM CHLORIDE 0.9 % IJ SOLN
50 mL | Freq: Once | INTRAVENOUS | 0 refills | Status: CP
Start: 2023-10-10 — End: ?
  Administered 2023-10-10: 09:00:00 50 mL via INTRAVENOUS

## 2023-10-10 MED ORDER — IPRATROPIUM BROMIDE 0.02 % IN SOLN
1 | RESPIRATORY_TRACT | 0 refills | Status: DC
Start: 2023-10-10 — End: 2023-10-11
  Administered 2023-10-10: 15:00:00 0.5 mg via RESPIRATORY_TRACT

## 2023-10-10 MED ORDER — POTASSIUM CHLORIDE IN WATER 10 MEQ/50 ML IV PGBK
10 meq | INTRAVENOUS | 0 refills | Status: CP
Start: 2023-10-10 — End: ?
  Administered 2023-10-11: 07:00:00 10 meq via INTRAVENOUS

## 2023-10-10 MED ORDER — ACETAMINOPHEN 1,000 MG/100 ML (10 MG/ML) IV SOLN
1000 mg | INTRAVENOUS | 0 refills | Status: DC | PRN
Start: 2023-10-10 — End: 2023-10-13
  Administered 2023-10-11 (×2): 1000 mg via INTRAVENOUS

## 2023-10-10 MED ORDER — GABAPENTIN 300 MG PO CAP
600 mg | Freq: Two times a day (BID) | ORAL | 0 refills | Status: DC
Start: 2023-10-10 — End: 2023-10-10

## 2023-10-10 MED ORDER — AMIODARONE 200 MG PO TAB
200 mg | Freq: Every day | ORAL | 0 refills | Status: CN
Start: 2023-10-10 — End: ?

## 2023-10-10 MED ORDER — OXYCODONE 10 MG PO TAB
10 mg | ORAL | 0 refills | Status: DC | PRN
Start: 2023-10-10 — End: 2023-10-11

## 2023-10-10 MED ORDER — METOPROLOL TARTRATE 50 MG PO TAB
50 mg | Freq: Two times a day (BID) | ORAL | 0 refills | Status: DC
Start: 2023-10-10 — End: 2023-10-11
  Administered 2023-10-10: 14:00:00 50 mg via ORAL

## 2023-10-10 MED ORDER — POLYETHYLENE GLYCOL 3350 17 GRAM PO PWPK
1 | Freq: Every day | ORAL | 0 refills | Status: DC | PRN
Start: 2023-10-10 — End: 2023-10-15

## 2023-10-10 MED ORDER — LISINOPRIL 20 MG PO TAB
20 mg | Freq: Two times a day (BID) | ORAL | 0 refills | Status: DC
Start: 2023-10-10 — End: 2023-10-14
  Administered 2023-10-10: 14:00:00 20 mg via ORAL

## 2023-10-10 MED ORDER — MELATONIN 5 MG PO TAB
5 mg | Freq: Every evening | ORAL | 0 refills | Status: DC | PRN
Start: 2023-10-10 — End: 2023-10-11

## 2023-10-10 MED ORDER — DIGOXIN 250 MCG/ML (0.25 MG/ML) IJ SOLN
100 ug | Freq: Every day | INTRAVENOUS | 0 refills | Status: DC
Start: 2023-10-10 — End: 2023-10-14
  Administered 2023-10-11 – 2023-10-14 (×4): 100 ug via INTRAVENOUS

## 2023-10-10 MED ORDER — NALOXONE 0.4 MG/ML IJ SOLN
.08 mg | INTRAVENOUS | 0 refills | Status: DC | PRN
Start: 2023-10-10 — End: 2023-10-13

## 2023-10-10 MED ORDER — SENNOSIDES-DOCUSATE SODIUM 8.6-50 MG PO TAB
1 | Freq: Every day | ORAL | 0 refills | Status: DC | PRN
Start: 2023-10-10 — End: 2023-10-15

## 2023-10-10 MED ORDER — HEPARIN (PORCINE) INITIAL BOLUS FOR CONTINUOUS INF (BAG)
7500 [IU] | Freq: Once | INTRAVENOUS | 0 refills | Status: CP
Start: 2023-10-10 — End: ?

## 2023-10-10 MED ORDER — NICOTINE 21 MG/24 HR TD PT24
1 | Freq: Every day | TRANSDERMAL | 0 refills | Status: DC
Start: 2023-10-10 — End: 2023-10-11
  Administered 2023-10-11: 1 via TRANSDERMAL

## 2023-10-10 MED ORDER — FENTANYL CITRATE (PF) 50 MCG/ML IJ SOLN
50 ug | INTRAVENOUS | 0 refills | Status: DC | PRN
Start: 2023-10-10 — End: 2023-10-12
  Administered 2023-10-11 (×7): 50 ug via INTRAVENOUS

## 2023-10-10 MED ORDER — ROPINIROLE 0.5 MG PO TAB
1 mg | Freq: Every evening | ORAL | 0 refills | Status: DC
Start: 2023-10-10 — End: 2023-10-19
  Administered 2023-10-15 – 2023-10-17 (×3): 1 mg via ORAL

## 2023-10-10 MED ORDER — HEPARIN (PORCINE) BOLUS FOR CONTINUOUS INFUSION (BAG) - APTT MAIN
20-40 [IU]/kg | INTRAVENOUS | 0 refills | Status: DC | PRN
Start: 2023-10-10 — End: 2023-10-11

## 2023-10-10 MED ORDER — PROPOFOL 10 MG/ML IV EMUL
5-70 ug/kg/min | INTRAVENOUS | 0 refills | Status: DC
Start: 2023-10-10 — End: 2023-10-13
  Administered 2023-10-11 (×2): 40 ug/kg/min via INTRAVENOUS
  Administered 2023-10-11: 03:00:00 150 ug/kg/min via INTRAVENOUS
  Administered 2023-10-11: 06:00:00 60 ug/kg/min via INTRAVENOUS
  Administered 2023-10-11: 12:00:00 40 ug/kg/min via INTRAVENOUS
  Administered 2023-10-11: 22:00:00 60 ug/kg/min via INTRAVENOUS
  Administered 2023-10-11: 20:00:00 45 ug/kg/min via INTRAVENOUS
  Administered 2023-10-12: 18:00:00 40 ug/kg/min via INTRAVENOUS
  Administered 2023-10-12: 14:00:00 35 ug/kg/min via INTRAVENOUS
  Administered 2023-10-12: 21:00:00 40 ug/kg/min via INTRAVENOUS
  Administered 2023-10-12: 01:00:00 35 ug/kg/min via INTRAVENOUS
  Administered 2023-10-12: 10:00:00 30 ug/kg/min via INTRAVENOUS
  Administered 2023-10-12: 08:00:00 40 ug/kg/min via INTRAVENOUS
  Administered 2023-10-12: 05:00:00 50 ug/kg/min via INTRAVENOUS
  Administered 2023-10-13: 10:00:00 40 ug/kg/min via INTRAVENOUS
  Administered 2023-10-13: 14:00:00 50 ug/kg/min via INTRAVENOUS
  Administered 2023-10-13: 19:00:00 60 ug/kg/min via INTRAVENOUS
  Administered 2023-10-13 (×3): 40 ug/kg/min via INTRAVENOUS

## 2023-10-10 MED ORDER — HEPARIN (PORCINE) IN 5 % DEX 20,000 UNIT/500 ML (40 UNIT/ML) IV SOLP
0-2000 [IU]/h | INTRAVENOUS | 0 refills | Status: DC
Start: 2023-10-10 — End: 2023-10-11
  Administered 2023-10-10 (×2): 1700 [IU]/h via INTRAVENOUS

## 2023-10-10 MED ORDER — POTASSIUM CHLORIDE IN WATER 10 MEQ/50 ML IV PGBK
10 meq | INTRAVENOUS | 0 refills | Status: CP
Start: 2023-10-10 — End: ?
  Administered 2023-10-11: 09:00:00 10 meq via INTRAVENOUS

## 2023-10-10 MED ORDER — ONDANSETRON HCL (PF) 4 MG/2 ML IJ SOLN
4 mg | INTRAVENOUS | 0 refills | Status: DC | PRN
Start: 2023-10-10 — End: 2023-10-27
  Administered 2023-10-10 – 2023-10-27 (×6): 4 mg via INTRAVENOUS

## 2023-10-10 MED ORDER — ASPIRIN 81 MG PO TBEC
81 mg | Freq: Every day | ORAL | 0 refills | Status: DC
Start: 2023-10-10 — End: 2023-10-14
  Administered 2023-10-10: 14:00:00 81 mg via ORAL

## 2023-10-10 MED ORDER — MORPHINE 2 MG/ML IV SYRG
1 mg | INTRAVENOUS | 0 refills | Status: DC | PRN
Start: 2023-10-10 — End: 2023-10-10
  Administered 2023-10-10: 14:00:00 1 mg via INTRAVENOUS

## 2023-10-10 MED ORDER — FENTANYL CITRATE (PF) 50 MCG/ML IJ SOLN
50 ug | Freq: Once | INTRAVENOUS | 0 refills | Status: CP
Start: 2023-10-10 — End: ?
  Administered 2023-10-10: 08:00:00 50 ug via INTRAVENOUS

## 2023-10-10 MED ORDER — VENLAFAXINE 150 MG PO CP24
150 mg | Freq: Every day | ORAL | 0 refills | Status: DC
Start: 2023-10-10 — End: 2023-10-14
  Administered 2023-10-10: 14:00:00 150 mg via ORAL

## 2023-10-10 MED ORDER — ACETAMINOPHEN 500 MG PO TAB
1000 mg | Freq: Once | ORAL | 0 refills | Status: DC | PRN
Start: 2023-10-10 — End: 2023-10-11

## 2023-10-10 NOTE — Progress Notes
 2100: pt began coughing uncontrollably and bearing down. PRN fentanyl given, propofol increased to max ordred dosage w/o improvement to coughing. Gown lifted and bowel visible through abtheral, SICU team called to beside. Propofol okay to be increased to per Dr. Sanjuana Mae. Verbal order received to give 2mg  of versed. (See MAR for documentation)  2110: Dr. Eudelia Bunch to bedside to assess pt.  2123:Dr. Massie Maroon to bedside.   2125: Paralytic give at this time and bedside procedure started to replace abthera. This RN, Dr. Sanjuana Mae, Dr. Eudelia Bunch and Dr. Massie Maroon present for procedure.

## 2023-10-10 NOTE — Consults
complete

## 2023-10-10 NOTE — Progress Notes
 RT Adult Assessment Note    NAME:Blake Decker             MRN: [id number]             DOB:05-21-1951          AGE: 73 y.o.  ADMISSION DATE: 10/10/2023             DAYS ADMITTED: LOS: 0 days    Additional Comments:  Impressions of the patient: Patient resting in bed on 2L of oxygen, denies home use of regular resp meds. No resp distress noted.     Intervention(s)/outcome(s): n/a    Patient education that was completed: n/a    Recommendations to the care team: Continue with RT tx plan as ordered; IS for lung expansion.    Vital Signs:  Pulse: 85  RR: 18 PER MINUTE  SpO2: 96 %  O2 Device: Nasal cannula  Liter Flow: 2 Lpm  O2%:      Breath Sounds:   Breath Sounds WDL: Within Defined Limits  Right Base Breath Sounds: Decreased  Left Base Breath Sounds: Decreased  Respiratory Effort:   Respiratory Effort/Pattern: Unlabored  Comments:

## 2023-10-10 NOTE — Unmapped
 Brief Operative Note    Name: Blake Decker is a 73 y.o. 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]    Procedure(s) (LRB):  DIAGNOSTIC LAPAROSCOPY, LAPAROSCOPIC ENTERECTOMY  ANGIOGRAPHY - VISCERAL - SELECTIVE/ SUPRASELECTIVE    Surgeons and Role:     * Palmina Clodfelter, Sol Passer, MD - Primary     * Marlor, Corwin Levins, MD - Resident - Assisting      Findings:      - ICG small and large bowel angiography with non-perfusion of segment of mid-jejunum with necrosis and small perforation  - 70 cm of segment of mid jejunum resected     Estimated Blood Loss: 40 ml    Specimen(s) Removed/Disposition: * No specimens in log *    Complications:  None      Implants: * No implants in log *    Drains: Details on drains available on LDA report    Disposition:  ICU - stable    Trenda Moots, MD  Pager

## 2023-10-10 NOTE — Consults
 [facility] Surgery Consult     Patient: Blake Decker, [id number]  Admission Date:  10/10/2023, LOS: 0 days  Admission Diagnosis: No admission diagnoses are documented for this encounter.  Date of Service: October 10, 2023  CONSULT EMERGENCY GENERAL SURGERY PHYSICIAN  Consult performed by: Melissa Noon, MD  Consult ordered by: Ardine Eng, MD  Reason for consult: CTA with concerns for early ischemia, elevated lactate, worsening abdominal pain         ASSESSMENT:   Blake Decker is a 73 y.o. male with HTN, OSA, afib (eliquis), CAD (stent 2014), COPD, dilated cardiomyopathy, PVD, chronic mesenteric ischemia, here with worsening abdominal pain and persistent diarrhea.     Patient initially seen at: 0323      PLAN:  - No emergent surgical intervention given hemodynamic stability. Abdominal exam benign and not peritonitic to evaluation.   - Recommend evaluation by medicine for admission for medical optimization in the setting of several medical co morbidities   - Recommend vascular surgery consult to evaluate the need for surgical interventions  - We will continue to follow with serial abdominal exams  - Please page [phone] with any questions or concerns. Thank you for this consult.     Seen and discussed with staff surgeon, Dr. Durene Cal.    Melissa Noon, MD  Service Pager: [phone]  _____________________________________________________________________________    HPI: Blake Decker is a 73 y.o. male with the history above who has known chronic mesenteric ischemia. He was recently discharged from [facility] on 09/30/23 for abdominal pain and syncope c/b L fibular fracure. CT scan obtained then demonstrated severe mesenteric and celiac atherosclerosis consistent with possible enteritis. He had ORIF of his L leg on 1/1. Additionally he underwent EGD where he was fond to have gastritis. He has had several admissions to numerous hospitals for abdominal pain over the last 3 months. Since then, he has had slowly worsening abdominal pain (10/10) and >10 loose nonbloody BM's daily. He denies N/V/F/C. He mentions having poor PO intake due to pain. He last took the eliquis yesterday.    Past Medical History:    Abnormal heart rhythms    Atrial fibrillation (HCC)    CAD (coronary artery disease)    COPD (chronic obstructive pulmonary disease) (HCC)    Dilated cardiomyopathy (HCC)    Former tobacco use    Heart disease    Hypertension    Lung disease    Obesity, Class II, BMI 35-39.9    OSA (obstructive sleep apnea)    PVD (peripheral vascular disease) (HCC)    Tobacco abuse     Surgical History:   Procedure Laterality Date    HX CORONARY STENT PLACEMENT  2014    DEBRIDEMENT WOUND DEEP 20 SQ CM OR LESS -UPPER EXTREMITY Left 09/24/2018    Performed by Dorthula Matas, MD at [facility] OR    DEBRIDEMENT WOUND MEDIUM 20 SQ CM OR LESS - UPPER EXTREMITY Left 09/26/2018    Performed by Azzie Glatter, MD at [facility] OR    NEGATIVE PRESSURE WOUND THERAPY FOR WOUND AREA 50 SQ CM OR LESS Left 09/26/2018    Performed by Azzie Glatter, MD at [facility] OR    DEBRIDEMENT WOUND DEEP 20 SQ CM OR LESS -UPPER EXTREMITY, SECONDARY WOUND CLOSURE Left 09/29/2018    Performed by Azzie Glatter, MD at [facility] OR     No family history on file.  Social History     Tobacco Use    Smoking  status: Former     Current packs/day: 0.00     Average packs/day: 1 pack/day for 34.0 years (34.0 ttl pk-yrs)     Types: Cigarettes     Start date: 06/09/1984     Quit date: 06/09/2018     Years since quitting: 5.3    Smokeless tobacco: Never   Substance Use Topics    Alcohol use: Not Currently     Your Current Medications:         Instructions    albuterol (PROVENTIL) 2 mg tablet Take 2 mg by mouth three times daily.    amiodarone (CORDARONE) 200 mg tablet Take 200 mg by mouth daily. Take with food.    amLODIPine (NORVASC) 5 mg tablet Take 5 mg by mouth daily.    aspirin EC 81 mg tablet Take 81 mg by mouth daily. Take with food.    atorvastatin (LIPITOR) 40 mg tablet Take 40 mg by mouth daily.    gabapentin (NEURONTIN) 300 mg capsule Take 600 mg by mouth twice daily.    HYDROcodone/acetaminophen (NORCO) 5/325 mg tablet Take 1 tablet by mouth every 4 hours as needed for Pain    ipratropium bromide (ATROVENT) 0.02 % nebulizer solution Inhale 1 vial by mouth into the lungs four times daily.    lisinopril (PRINIVIL; ZESTRIL) 20 mg tablet Take 20 mg by mouth twice daily.    metoprolol tartrate (LOPRESSOR) 50 mg tablet Take one tablet by mouth twice daily.    MULTIVITAMIN PO Take  by mouth.    nitroglycerin (NITROSTAT) 0.4 mg tablet Place 0.4 mg under tongue every 5 minutes as needed for Chest Pain. Max of 3 tablets, call 911.    rivaroxaban (XARELTO) 20 mg tablet Take 20 mg by mouth daily. Take with food.    tamsulosin (FLOMAX) 0.4 mg capsule Take 0.4 mg by mouth daily. Do not crush, chew or open capsules. Take 30 minutes following the same meal each day.    umeclidinium-vilanterol (ANORO ELLIPTA) 62.5-25 mcg/actuation inhaler Inhale 1 puff by mouth into the lungs daily.    venlafaxine XR (EFFEXOR XR) 150 mg capsule Take 150 mg by mouth daily. Take with food.            ROS  A 14 point comprehensive review of systems is negative except as noted in the HPI.    Vitals:  BP: (137-154)/(89-111)   Temp:  [37.1 ?C (98.7 ?F)]   Pulse:  [63-86]   Respirations:  [16 PER MINUTE]   SpO2:  [95 %-98 %]   O2 Device: None (Room air)  Body mass index is 35.26 kg/m?Marland Kitchen    Physical Exam  Gen: NAD, nontoxic  HEENT: NCAT, EOMI, anicteric sclerae  CV: Normal rate, regular rhythm  Pulm: Non-labored respirations on RA  Abd: Soft, ND, mildly tender to palpation to epigastric region.  Neuro: GCS 15, MAE spontaneously  Psych: Normal affect    Lab/Radiology/Other Diagnostic Tests:  Lab Results   Component Value Date/Time    NA 135 (L) 10/10/2023 01:50 AM    K 4.5 10/10/2023 01:50 AM    CL 95 (L) 10/10/2023 01:50 AM    CO2 24 10/10/2023 01:50 AM    BUN 16 10/10/2023 01:50 AM    CR 1.4 (H) 10/10/2023 01:58 AM    MG 2.0 10/01/2018 05:41 AM    PO4 4.0 09/24/2018 04:16 AM          Lab Results   Component Value Date/Time    HGB 15.3 10/10/2023 01:50 AM  HCT 46.0 10/10/2023 01:50 AM    WBC 18.50 (H) 10/10/2023 01:50 AM    PLTCT 376 10/10/2023 01:50 AM    INR 1.1 05/28/2019 06:06 PM     No results found for: GLUPOC   Physical Exam      CTA ABDOMEN/PELVIS   Final Result   Abnormal         1.  Gas and fluid distention of small and large bowel loops throughout the abdomen with mild dilatation of several small bowel loops in the left abdomen, nonspecific and could be seen with acute gastrointestinal/diarrheal illness or ileus. However, given questionable early pneumatosis involving a left abdominal small bowel loop, ischemia is an additional consideration. No hypoenhancing bowel segment or pneumoperitoneum. Clinical and laboratory correlation recommended.      2.  Progression of now severe stenosis of the proximal SMA and moderate to severe stenosis of the more distal SMA with preserved downstream opacification. No discrete thrombus. Similar severe stenosis of the celiac origin. At least mild stenosis of the IMA origin.      3.  Development of small intermediate density right renal lesion which could represent hemorrhagic/proteinaceous cyst or hypovascular solid renal mass. Follow up CT or MRI abdomen (renal mass protocol) in 3-6 months recommended for reevaluation.      4.  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      5.  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      Findings suggestive the telephone with Dr. Mont Dutton at 3:02 AM 10/10/2023.      #FOLLOW          Finalized by Jason Coop, M.D. on 10/10/2023 3:03 AM. Dictated by Jason Coop, M.D. on 10/10/2023 2:49 AM.

## 2023-10-10 NOTE — Consults
 [facility] Consult     Patient: Blake Decker, [id number]  Admission Date:  10/10/2023, LOS: 0 days  Admission Diagnosis: Superior mesenteric artery stenosis [facility]) [K55.1]  Date of Service: October 10, 2023    Consults     ASSESSMENT:   Blake Decker is a 73 y.o. male with HTN, OSA, afib (eliquis), CAD (stent 2014), COPD, dilated cardiomyopathy, PVD, chronic mesenteric ischemia, here with worsening abdominal pain and persistent diarrhea. Vascular Surgery consulted due to concern for acute on chronic mesenteric ischemia.    Hypertensive but hemodynamically stable on exam. CTA reviewed with evidence of severe stenosis of SMA and celiac origin with mild stenosis of IMA. No discrete thrombus. Benign abdominal exam at this time. Labs significant for a leukocytosis of 18.5 and lactate of 2.5.    PLAN:  - No emergent vascular surgery intervention.   - Agree with evaluation by medicine for admission given multiple, significant medical co morbidities.  - Recommend heparin gtt for anticoagulation. Hold PTA Xarelto.   - Agree with GI work up. Will follow results of this.  - Will follow for possible vascular surgery intervention this admission pending above work up.    Will discuss with staff, Dr. Karoline Caldwell, DO  Service Pager: [phone]  _____________________________________________________________________________    HPI:   Blake Decker is a 73 y.o. male with the history above who presents to ED with worsening abdominal pain and persistent diarrhea. He reports his pain has been going on for years but gotten significantly worse over the last several weeks. He had had poor po intake due to the pain. He endorses 10 loose stools daily, non-bloody in nature. He denies any nausea, vomiting, fevers or chills.     He has known chronic mesenteric ischemia and follows every 6 months with Vascular Surgery at [facility], Mendel Ryder. He was last seen in November and at that time, was not noted to have any symptoms of mesenteric ischemia. Upon chart review, he has had bilateral iliac and SFA stents placed for claudication.    He takes Xarelto daily for history of atrial fibrillation, last dose yesterday.   Former smoker.      Past Medical History:    Abnormal heart rhythms    Atrial fibrillation (HCC)    CAD (coronary artery disease)    COPD (chronic obstructive pulmonary disease) (HCC)    Dilated cardiomyopathy (HCC)    Former tobacco use    Heart disease    Hypertension    Lung disease    Obesity, Class II, BMI 35-39.9    OSA (obstructive sleep apnea)    PVD (peripheral vascular disease) (HCC)    Tobacco abuse     Surgical History:   Procedure Laterality Date    HX CORONARY STENT PLACEMENT  2014    DEBRIDEMENT WOUND DEEP 20 SQ CM OR LESS -UPPER EXTREMITY Left 09/24/2018    Performed by Dorthula Matas, MD at [facility] OR    DEBRIDEMENT WOUND MEDIUM 20 SQ CM OR LESS - UPPER EXTREMITY Left 09/26/2018    Performed by Azzie Glatter, MD at [facility] OR    NEGATIVE PRESSURE WOUND THERAPY FOR WOUND AREA 50 SQ CM OR LESS Left 09/26/2018    Performed by Azzie Glatter, MD at [facility] OR    DEBRIDEMENT WOUND DEEP 20 SQ CM OR LESS -UPPER EXTREMITY, SECONDARY WOUND CLOSURE Left 09/29/2018    Performed by Azzie Glatter, MD at [facility] OR  No family history on file.  Social History     Tobacco Use    Smoking status: Former     Current packs/day: 0.00     Average packs/day: 1 pack/day for 34.0 years (34.0 ttl pk-yrs)     Types: Cigarettes     Start date: 06/09/1984     Quit date: 06/09/2018     Years since quitting: 5.3    Smokeless tobacco: Never   Substance Use Topics    Alcohol use: Not Currently     Your Current Medications:         Instructions    albuterol (PROVENTIL) 2 mg tablet Take 2 mg by mouth three times daily.    amiodarone (CORDARONE) 200 mg tablet Take 200 mg by mouth daily. Take with food.    amLODIPine (NORVASC) 5 mg tablet Take 5 mg by mouth daily.    aspirin EC 81 mg tablet Take 81 mg by mouth daily. Take with food. atorvastatin (LIPITOR) 40 mg tablet Take 40 mg by mouth daily.    gabapentin (NEURONTIN) 300 mg capsule Take 600 mg by mouth twice daily.    HYDROcodone/acetaminophen (NORCO) 5/325 mg tablet Take 1 tablet by mouth every 4 hours as needed for Pain    ipratropium bromide (ATROVENT) 0.02 % nebulizer solution Inhale 1 vial by mouth into the lungs four times daily.    lisinopril (PRINIVIL; ZESTRIL) 20 mg tablet Take 20 mg by mouth twice daily.    metoprolol tartrate (LOPRESSOR) 50 mg tablet Take one tablet by mouth twice daily.    MULTIVITAMIN PO Take  by mouth.    nitroglycerin (NITROSTAT) 0.4 mg tablet Place 0.4 mg under tongue every 5 minutes as needed for Chest Pain. Max of 3 tablets, call 911.    rivaroxaban (XARELTO) 20 mg tablet Take 20 mg by mouth daily. Take with food.    tamsulosin (FLOMAX) 0.4 mg capsule Take 0.4 mg by mouth daily. Do not crush, chew or open capsules. Take 30 minutes following the same meal each day.    umeclidinium-vilanterol (ANORO ELLIPTA) 62.5-25 mcg/actuation inhaler Inhale 1 puff by mouth into the lungs daily.    venlafaxine XR (EFFEXOR XR) 150 mg capsule Take 150 mg by mouth daily. Take with food.            Review of Systems:  A 12 point review of symptoms was performed and was negative unless otherwise noted in the HPI.    Vitals:  BP: (137-154)/(89-111)   Temp:  [37.1 ?C (98.7 ?F)]   Pulse:  [63-86]   Respirations:  [16 PER MINUTE]   SpO2:  [95 %-98 %]   O2 Device: None (Room air)    Physical Exam:    Gen: A&Ox3, NAD  HEENT: Normocephalic, atraumatic.   Pulm: Unlabored on RA  CV: Regular rate  Abdomen: Soft,non-tender, non-distended  Neuro: Motor strength and sensation grossly intact throughout, no focal deficits  Extremities: No cyanosis or edema  Skin: Warm and dry      Lab/Radiology/Other Diagnostic Tests:  Lab Results   Component Value Date/Time    NA 135 (L) 10/10/2023 01:50 AM    K 4.5 10/10/2023 01:50 AM    CL 95 (L) 10/10/2023 01:50 AM    CO2 24 10/10/2023 01:50 AM    BUN 16 10/10/2023 01:50 AM    CR 1.4 (H) 10/10/2023 01:58 AM    MG 2.0 10/01/2018 05:41 AM    PO4 4.0 09/24/2018 04:16 AM  Lab Results   Component Value Date/Time    HGB 15.3 10/10/2023 01:50 AM    HCT 46.0 10/10/2023 01:50 AM    WBC 18.50 (H) 10/10/2023 01:50 AM    PLTCT 376 10/10/2023 01:50 AM    INR 1.1 05/28/2019 06:06 PM     No results found for: GLUPOC     CTA ABDOMEN/PELVIS   Final Result   Abnormal         1.  Gas and fluid distention of small and large bowel loops throughout the abdomen with mild dilatation of several small bowel loops in the left abdomen, nonspecific and could be seen with acute gastrointestinal/diarrheal illness or ileus. However, given questionable early pneumatosis involving a left abdominal small bowel loop, ischemia is an additional consideration. No hypoenhancing bowel segment or pneumoperitoneum. Clinical and laboratory correlation recommended.      2.  Progression of now severe stenosis of the proximal SMA and moderate to severe stenosis of the more distal SMA with preserved downstream opacification. No discrete thrombus. Similar severe stenosis of the celiac origin. At least mild stenosis of the IMA origin.      3.  Development of small intermediate density right renal lesion which could represent hemorrhagic/proteinaceous cyst or hypovascular solid renal mass. Follow up CT or MRI abdomen (renal mass protocol) in 3-6 months recommended for reevaluation.      4.  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      5.  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      Findings suggestive the telephone with Dr. Mont Dutton at 3:02 AM 10/10/2023.      #FOLLOW          Finalized by Jason Coop, M.D. on 10/10/2023 3:03 AM. Dictated by Jason Coop, M.D. on 10/10/2023 2:49 AM.

## 2023-10-10 NOTE — ED Notes
 PT HAD LARGE BM OF WATERY, BROWN STOOL ON BSC.

## 2023-10-10 NOTE — ED Notes
 PT REPORTS NEEDING A CPAP WHEN SLEEPING, HAS OWN CPAP IN PERSONAL BELONGINGS.

## 2023-10-10 NOTE — Progress Notes
 EGS Brief Note    Called to evaluate patient currently hospitalized for abdominal pain with concern for infectious versus ischemic etiology in setting of significant mesenteric vascular disease. Now endorses significant worsening of abdominal pain in mid abdomen compared to earlier today. Vitals stable, on my exam he has severe TTP of the mid abdomen with rebound and guarding. He is unable to find a comfortable position in bed. I reviewed his earlier CT demonstrating the mesenteric vascular disease and possible small bowel pneumatosis. Based upon these changes, I discussed urgent exploration in the form of diagnostic laparoscopy with possible laparotomy with possible bowel resection based upon our findings and feasibility of laparoscopy. We discussed risks including infection, bleeding, damage to surrounding structures, need for multiple procedures, ostomy and potential need for vascular intervention. He is amenable to proceeding to the OR urgently.

## 2023-10-10 NOTE — ED Notes
 PT HAD A SMALL EPISODE OF BROWN DIARRHEA.

## 2023-10-10 NOTE — Care Coordination-Inpatient
Med-Private Night 5 Team will take calls on this patient until 8 am. (Voalte is the preferred way of communication. However, if needed; pager number for this team is [phone]). Afterwards, please contact Med Y Team for any questions or concerns

## 2023-10-10 NOTE — Unmapped
 Bedside procedure note    Called to bedside due to ability to see bowel through the abthera. Patient was coughing and the staples on the right side had become undone and were no longer connecting the outer sponge to the skin and intestines were able to be seen. The patient was given 2mg  of versed and 0.6mg /kg of rocuronium. The outer sponge was removed. The inner sponge was still in appropriate position. The abdomen was prepped and draped. Two new sponges were cut to size and stapled in place. The adhesive cover was placed and the abthera was hooked up to suction.     Dr Massie Maroon was present for the procedure.     Blake Royals, MD

## 2023-10-10 NOTE — Procedures
 Patient's ring removed:  Gold metal ring  with 2 rows of clear stones.(7 clear stones on each row).

## 2023-10-11 ENCOUNTER — Encounter: Admit: 2023-10-11 | Discharge: 2023-10-11 | Payer: MEDICARE

## 2023-10-11 ENCOUNTER — Inpatient Hospital Stay: Admit: 2023-10-11 | Discharge: 2023-10-11 | Payer: MEDICARE

## 2023-10-11 ENCOUNTER — Inpatient Hospital Stay: Admit: 2023-10-11 | Discharge: 2023-10-12 | Payer: MEDICARE

## 2023-10-11 LAB — CBC
~~LOC~~ BKR HEMATOCRIT: 40 % — ABNORMAL LOW (ref 40.0–50.0)
~~LOC~~ BKR MCH: 28 pg (ref 26.0–34.0)
~~LOC~~ BKR MCHC: 34 g/dL (ref 32.0–36.0)
~~LOC~~ BKR MCHC: 32 g/dL — ABNORMAL HIGH (ref 32.0–36.0)
~~LOC~~ BKR MCV: 83 fL — ABNORMAL HIGH (ref 80.0–100.0)
~~LOC~~ BKR RBC COUNT: 4.8 10*6/uL — ABNORMAL LOW (ref 4.40–5.50)
~~LOC~~ BKR RBC COUNT: 4.6 10*6/uL — ABNORMAL LOW (ref 4.40–5.50)
~~LOC~~ BKR RDW: 14 % (ref 11.0–15.0)
~~LOC~~ BKR WBC COUNT: 16 10*3/uL — ABNORMAL HIGH (ref 4.50–11.00)
~~LOC~~ BKR WBC COUNT: 17 10*3/uL — ABNORMAL HIGH (ref 4.50–11.00)
~~LOC~~ BKR WBC COUNT: 16 10*3/uL — ABNORMAL HIGH (ref 4.50–11.00)

## 2023-10-11 LAB — COMPREHENSIVE METABOLIC PANEL
~~LOC~~ BKR ALBUMIN: 3.2 g/dL — ABNORMAL LOW (ref 3.5–5.0)
~~LOC~~ BKR ALBUMIN: 3 g/dL — ABNORMAL LOW (ref 3.5–5.0)
~~LOC~~ BKR ALK PHOSPHATASE: 97 U/L (ref 25–110)
~~LOC~~ BKR ALK PHOSPHATASE: 92 U/L (ref 25–110)
~~LOC~~ BKR ALT: 10 U/L (ref 7–56)
~~LOC~~ BKR ALT: 12 U/L (ref 7–56)
~~LOC~~ BKR ANION GAP: 12 (ref 3–12)
~~LOC~~ BKR ANION GAP: 10 (ref 3–12)
~~LOC~~ BKR AST: 12 U/L (ref 7–40)
~~LOC~~ BKR AST: 18 U/L (ref 7–40)
~~LOC~~ BKR CO2: 27 mmol/L (ref 21–30)
~~LOC~~ BKR CO2: 26 mmol/L (ref 21–30)
~~LOC~~ BKR GLOMERULAR FILTRATION RATE (GFR): >60 mL/min (ref >60)
~~LOC~~ BKR GLOMERULAR FILTRATION RATE (GFR): 50 mL/min — ABNORMAL LOW (ref >60)
~~LOC~~ BKR POTASSIUM: 3.7 mmol/L — ABNORMAL LOW (ref 3.5–5.1)
~~LOC~~ BKR TOTAL BILIRUBIN: 1.7 mg/dL — ABNORMAL HIGH (ref 0.2–1.3)

## 2023-10-11 LAB — TEG WITH KAOLIN & HEPARINASE
~~LOC~~ BKR ANGLE KAOLIN WITH HEPARINASE: 74 deg (ref 55.0–75.0)
~~LOC~~ BKR ANGLE KAOLIN WITH HEPARINASE: 42 deg — ABNORMAL LOW (ref 55.0–75.0)
~~LOC~~ BKR K KAOLIN WITH HEPARINASE: 1 min (ref 1.0–3.0)
~~LOC~~ BKR K KAOLIN WITH HEPARINASE: 2 min (ref 1.0–3.0)
~~LOC~~ BKR LYSIS30 KAOLIN WITH HEPARINASE: 0.5 % (ref 0.0–8.0)
~~LOC~~ BKR LYSIS30 KAOLIN WITH HEPARINASE: 0 % — ABNORMAL LOW (ref 0.0–8.0)
~~LOC~~ BKR MA KAOLIN WITH HEPARINASE: 77 mm — ABNORMAL HIGH (ref 50.0–70.0)
~~LOC~~ BKR MA KAOLIN WITH HEPARINASE: 72 mm — ABNORMAL HIGH (ref 50.0–70.0)
~~LOC~~ BKR R KAOLIN WITH HEPARINASE: 5.2 min (ref 3.0–9.0)
~~LOC~~ BKR R KAOLIN WITH HEPARINASE: 8.2 min — ABNORMAL HIGH (ref 3.0–9.0)
~~LOC~~ BKR RK KAOLIN WITH HEPARINASE: 6.2 min (ref 4.0–12.0)
~~LOC~~ BKR RK KAOLIN WITH HEPARINASE: 10 min (ref 4.0–12.0)

## 2023-10-11 LAB — CULTURE-URINE W/SENSITIVITY

## 2023-10-11 LAB — LACTIC ACID(LACTATE)
~~LOC~~ BKR LACTIC ACID: 0.7 mmol/L (ref 0.5–2.0)
~~LOC~~ BKR LACTIC ACID: 0.9 mmol/L — ABNORMAL HIGH (ref 0.5–2.0)

## 2023-10-11 LAB — BASIC METABOLIC PANEL
~~LOC~~ BKR ANION GAP: 11 — ABNORMAL LOW (ref 3–12)
~~LOC~~ BKR ANION GAP: 15 fL — ABNORMAL HIGH (ref 3–12)
~~LOC~~ BKR BLD UREA NITROGEN: 19 — ABNORMAL HIGH (ref 7–25)
~~LOC~~ BKR BLD UREA NITROGEN: 23 mg/dL (ref 7–25)
~~LOC~~ BKR CALCIUM: 8.6 — ABNORMAL LOW (ref 8.5–10.6)
~~LOC~~ BKR CHLORIDE: 93 mmol/L — ABNORMAL LOW (ref 98–110)
~~LOC~~ BKR CO2: 27 — ABNORMAL LOW (ref 21–30)
~~LOC~~ BKR CO2: 24 mmol/L (ref 21–30)
~~LOC~~ BKR CREATININE: 1.1 — ABNORMAL HIGH (ref 0.40–1.24)
~~LOC~~ BKR GLOMERULAR FILTRATION RATE (GFR): >60 — ABNORMAL LOW (ref >60–185)
~~LOC~~ BKR GLOMERULAR FILTRATION RATE (GFR): 56 mL/min — ABNORMAL LOW (ref >60)
~~LOC~~ BKR GLUCOSE, RANDOM: 153 mg/dL — ABNORMAL HIGH (ref 70–100)
~~LOC~~ BKR POTASSIUM: 3.7 mmol/L — ABNORMAL LOW (ref 3.5–5.1)
~~LOC~~ BKR SODIUM, SERUM: 132 mmol/L — ABNORMAL LOW (ref 137–147)

## 2023-10-11 LAB — IONIZED CALCIUM: ~~LOC~~ BKR IONIZED CALCIUM: 1 mmol/L (ref 1.00–1.30)

## 2023-10-11 LAB — HIGH SENSITIVITY TROPONIN I 0 HOUR: ~~LOC~~ BKR HIGH SENSITIVITY TROPONIN I 0 HOUR: 12 ng/L (ref <20)

## 2023-10-11 LAB — BLOOD GASES, ARTERIAL
~~LOC~~ BKR BASE EXCESS-ART: 2.3 mmol/L (ref 0.2–1.3)
~~LOC~~ BKR PCO2-ART: 49 mmHg — ABNORMAL HIGH (ref 35–45)
~~LOC~~ BKR PCO2-ART: 44 mmHg — ABNORMAL HIGH (ref 35–45)
~~LOC~~ BKR PH-ART: 7.4 mg/dL — ABNORMAL HIGH (ref 7.35–7.45)
~~LOC~~ BKR PO2-ART: 283 mmHg — ABNORMAL HIGH (ref 80–100)
~~LOC~~ BKR PO2-ART: 88 mmHg — ABNORMAL HIGH (ref 80–100)

## 2023-10-11 LAB — TYPE & CROSSMATCH
~~LOC~~ BKR ANTIBODY SCREEN: NEGATIVE
~~LOC~~ BKR UNITS ORDERED: 0

## 2023-10-11 LAB — PHOSPHORUS: ~~LOC~~ BKR PHOSPHORUS: 5 mg/dL — ABNORMAL HIGH (ref 2.0–4.5)

## 2023-10-11 LAB — ECG 12-LEAD
Q-T INTERVAL: 354 ms
QRS DURATION: 114 ms
QTC CALCULATION (BAZETT): 435 ms
R AXIS: -19 degrees
T AXIS: 125 degrees
VENTRICULAR RATE: 91 {beats}/min

## 2023-10-11 LAB — PTT (APTT)
~~LOC~~ BKR PTT: 37 s — ABNORMAL HIGH (ref 24.0–36.5)
~~LOC~~ BKR PTT: 66 s — ABNORMAL HIGH (ref 24.0–36.5)

## 2023-10-11 LAB — ROTAVIRUS ANTIGEN, FECAL: ~~LOC~~ BKR ROTAVIRUS DIRECT ANTIGEN: NEGATIVE

## 2023-10-11 LAB — SODIUM: ~~LOC~~ BKR SODIUM, SERUM: 132 mmol/L — ABNORMAL LOW (ref 137–147)

## 2023-10-11 LAB — CONFIRMATION BLOOD TYPE

## 2023-10-11 LAB — MAGNESIUM: ~~LOC~~ BKR MAGNESIUM: 2 mg/dL — ABNORMAL LOW (ref 1.6–2.6)

## 2023-10-11 MED ORDER — POTASSIUM CHLORIDE IN WATER 10 MEQ/50 ML IV PGBK
10 meq | INTRAVENOUS | 0 refills | Status: CP
Start: 2023-10-11 — End: ?
  Administered 2023-10-12: 05:00:00 10 meq via INTRAVENOUS

## 2023-10-11 MED ORDER — LACTATED RINGERS IV SOLP
1000 mL | INTRAVENOUS | 0 refills | Status: CP
Start: 2023-10-11 — End: ?
  Administered 2023-10-11: 07:00:00 1000 mL via INTRAVENOUS

## 2023-10-11 MED ORDER — CLOPIDOGREL 75 MG PO TAB
75 mg | Freq: Every day | ORAL | 0 refills | Status: DC
Start: 2023-10-11 — End: 2023-10-11

## 2023-10-11 MED ORDER — HYDROMORPHONE (PF) 2 MG/ML IJ SYRG
.5-1 mg | INTRAVENOUS | 0 refills | Status: DC | PRN
Start: 2023-10-11 — End: 2023-10-12
  Administered 2023-10-12: 04:00:00 1 mg via INTRAVENOUS

## 2023-10-11 MED ORDER — HEPARIN (PORCINE) IN 5 % DEX 20,000 UNIT/500 ML (40 UNIT/ML) IV SOLP
0-2000 [IU]/h | INTRAVENOUS | 0 refills | Status: DC
Start: 2023-10-11 — End: 2023-10-13
  Administered 2023-10-11 – 2023-10-13 (×4): 1700 [IU]/h via INTRAVENOUS

## 2023-10-11 MED ORDER — ASPIRIN 300 MG RE SUPP
300 mg | Freq: Every day | RECTAL | 0 refills | Status: DC
Start: 2023-10-11 — End: 2023-10-14
  Administered 2023-10-11 – 2023-10-14 (×4): 300 mg via RECTAL

## 2023-10-11 MED ORDER — NOREPINEPHRINE BITARTRATE-D5W 4 MG/250 ML (16 MCG/ML) IV SOLN
0-.5 ug/kg/min | INTRAVENOUS | 0 refills | Status: DC
Start: 2023-10-11 — End: 2023-10-13
  Administered 2023-10-11: 0.06 ug/kg/min via INTRAVENOUS
  Administered 2023-10-12: 06:00:00 0.08 ug/kg/min via INTRAVENOUS
  Administered 2023-10-12: 20:00:00 0.1 ug/kg/min via INTRAVENOUS
  Administered 2023-10-12 – 2023-10-13 (×4): 0.08 ug/kg/min via INTRAVENOUS

## 2023-10-11 MED ORDER — POTASSIUM CHLORIDE IN WATER 10 MEQ/50 ML IV PGBK
10 meq | INTRAVENOUS | 0 refills | Status: CP
Start: 2023-10-11 — End: ?
  Administered 2023-10-12: 03:00:00 10 meq via INTRAVENOUS

## 2023-10-11 MED ORDER — POTASSIUM CHLORIDE IN WATER 10 MEQ/50 ML IV PGBK
10 meq | INTRAVENOUS | 0 refills | Status: CP
Start: 2023-10-11 — End: ?
  Administered 2023-10-12: 04:00:00 10 meq via INTRAVENOUS

## 2023-10-11 MED ORDER — POTASSIUM CHLORIDE IN WATER 10 MEQ/50 ML IV PGBK
10 meq | Freq: Once | INTRAVENOUS | 0 refills | Status: CP
Start: 2023-10-11 — End: ?
  Administered 2023-10-11: 20:00:00 10 meq via INTRAVENOUS

## 2023-10-11 MED ORDER — LACTATED RINGERS IV SOLP
500 mL | INTRAVENOUS | 0 refills | Status: CP
Start: 2023-10-11 — End: ?

## 2023-10-11 MED ADMIN — PROPOFOL 10 MG/ML IV EMUL [11150]: 100 ug/kg/min | INTRAVENOUS | @ 02:00:00 | Stop: 2023-10-11 | NDC 63323026965

## 2023-10-11 MED ADMIN — ROCURONIUM 10 MG/ML IV SOLN GROUP [280003]: 56.2 mg | INTRAVENOUS | @ 03:00:00 | Stop: 2023-10-11 | NDC 71288071810

## 2023-10-11 MED ADMIN — NOREPINEPHRINE BITARTRATE-D5W 4 MG/250 ML (16 MCG/ML) IV SOLN [173410]: 0.04 ug/kg/min | INTRAVENOUS | @ 17:00:00 | Stop: 2023-10-11 | NDC 00338011220

## 2023-10-11 MED ADMIN — LACTATED RINGERS IV SOLP [4318]: 500 mL | INTRAVENOUS | @ 19:00:00 | Stop: 2023-10-11 | NDC 00338011703

## 2023-10-11 MED ADMIN — MIDAZOLAM 1 MG/ML IJ SOLN [10607]: 2 mg | INTRAVENOUS | @ 03:00:00 | Stop: 2023-10-11 | NDC 23155060031

## 2023-10-11 NOTE — Progress Notes
 Adult Mechanical Ventilator Liberation    Name: Blake Decker   MRN: [id number]     DOB: [birth year]      Age: 73 y.o.  Admission Date: 10/10/2023     LOS: 1 day     Date of Service: 10/11/2023        Adult Mechanical Ventilator Liberation: Twice daily     Weaning Readiness Screen Met (RT Only):: No, patient failed SAT and RASS is >1 or <-2    Team is doing a couple bedside procedures

## 2023-10-11 NOTE — Progress Notes
 Daily Progress Note      Date of Service:  10/11/2023  Name:  Blake Decker                       MRN:  [id number]   Admission Date: 10/10/2023 (LOS: 1 day)                     Assessment: Blake Decker is a 73 y.o. 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    Principal Problem:    Superior mesenteric artery stenosis (HCC)      Plan:  - ESTAT to OR yesterday with EGS team, 70 cm of necrotic jejunum with perforation.   - CTA reviewed with staff. Plan for mesenteric angiogram with possible stent today.   - Will plan to use L radial A line for access for procedure. R radial A line placed by SICU team for BP monitoring  - Not on pressors at this time  - Vent wean per ICU  - NPO, in discontinuity  - Continue hep gtt  - Please ensure patient has up to date type and cross  - Please page [phone] with questions, concerns, or changes in clinical status.       Discussed with Dr. Naomie Dean  _____________________________________________________________________________    Subjective:  Patient ESTAT to the OR with acute abdomen, found to have 70cm necrotic jejunum. Post operatively, coughing resulted in separation of skin from Abthera sponge with visible bowel, replaced by ICU team.     Objective:  BP: (117-198)/(79-111)   ABP: (101-198)/(53-85)   Temp:  [36.1 ?C (97 ?F)-37.5 ?C (99.5 ?F)]   Pulse:  [71-113]   Respirations:  [13 PER MINUTE-25 PER MINUTE]   SpO2:  [91 %-98 %]   O2%:  [40 %-100 %]   O2 Device: Ventilator  O2 Liter Flow: 2 Lpm  Body mass index is 32.79 kg/m?Marland Kitchen    Lab Results   Component Value Date/Time    HGB 13.3 (L) 10/11/2023 03:31 AM    HCT 40.0 10/11/2023 03:31 AM    WBC 18.00 (H) 10/11/2023 03:31 AM    PLTCT 318 10/11/2023 03:31 AM    INR 1.1 05/28/2019 06:06 PM     Lab Results   Component Value Date/Time    NA 132 (L) 10/11/2023 03:31 AM    K 4.0 10/11/2023 03:31 AM    CL 94 (L) 10/11/2023 03:31 AM    CO2 27 10/11/2023 03:31 AM    BUN 19 10/11/2023 03:31 AM    CR 1.14 10/11/2023 03:31 AM    MG 2.4 10/11/2023 03:31 AM    PO4 4.1 10/11/2023 03:31 AM    CA 8.6 10/11/2023 03:31 AM     No results found for: GLUPOC       Physical Exam  General: Intubated and sedated. No acute distress  Head: Normocephalic, without obvious abnormality, atraumatic. NGT in place with dark output  Eyes: PERRL, EOMI, no scleral icterus  Neck: Supple, symmetrical, trachea midline  Lungs: Mechanically ventilated. Symmetric chest rise. Synchronous on vent.   Heart: Regular rate. Normotensive. Palpable radial pulses.   Abdomen: Soft, distended. Abthera in place  Skin: Skin color, texture, turgor normal. No rashes or lesions.   Extremity: No clubbing, cyanosis, or edema  Neurologic: Sedated  Active Wounds          Wounds Pressure injury Mid Sacrum (Active)   10/10/23 1655   Wound Type: Pressure injury   Orientation: Mid   Location: Sacrum   Wound Location Comments:    Initial Wound Site Closure:    Initial Dressing Placed:    Initial Cycle:    Initial Suction Setting (mmHg):    Pressure Injury Stages: Stage 1   Pressure Injury Present Within 24 Hours of Hospital Admission: Yes   If This Pressure Injury Is Suspected to Be Device Related, Please Select the Device::    Is the Wound Open or Closed:    Wound Assessment Pink;Red;Other (Comment) 10/11/23 0400   Peri-wound Assessment Intact 10/11/23 0400   Wound Drainage Amount None 10/11/23 0400   Wound Dressing Status None/open to air 10/11/23 0400   Number of days: 1       Wounds Surgical incision Medial;Upper Abdomen (Active)   10/10/23 1901   Wound Type: Surgical incision   Orientation: Medial;Upper   Location: Abdomen   Wound Location Comments: Port sites x 3.  Converted to open with midline inision.   Initial Wound Site Closure: Staples   Initial Dressing Placed: Negative pressure therapy (wound VAC dressing);Gauze;Transparent (i.e. Tegaderm)   Initial Cycle:    Initial Suction Setting (mmHg):    Pressure Injury Stages:    Pressure Injury Present Within 24 Hours of Hospital Admission:    If This Pressure Injury Is Suspected to Be Device Related, Please Select the Device::    Is the Wound Open or Closed:    Wound Assessment Dressing not removed for assessment 10/11/23 0400   Peri-wound Assessment Intact 10/11/23 0400   Wound Dressing Status Intact 10/11/23 0400   Wound Care Dressing changed or new application 10/10/23 2130   Wound Dressing and/or Treatment Negative pressure therapy 10/11/23 0400   Cycle Continuous 10/11/23 0400   Suction Setting (mmHg) -125 mmHg 10/11/23 0400   Drain Output (mL) 25 ml 10/11/23 0400   Number of days: 1                                              ___________________________    Scherry Ran, MD   Service Pager: # [phone]

## 2023-10-11 NOTE — Progress Notes
 Adult Mechanical Ventilator Liberation    Name: Blake Decker   MRN: [id number]     DOB: 08-Dec-1950      Age: 73 y.o.  Admission Date: 10/10/2023     LOS: 1 day     Date of Service: 10/11/2023        Adult Mechanical Ventilator Liberation: Twice daily     Weaning Readiness Screen Met (RT Only):: No, patient failed SAT and RASS is >1 or <-2 (pt becomes agitated when sedation reduced, going to OR today)

## 2023-10-11 NOTE — Progress Notes
 Surgical Critical Care Progress Note    Today's Date: 10/11/2023   Hospital Day: Hospital Day: 2    History of Present Illness:   Blake Decker is a 73 y.o. 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, SMA stenosis s/p ex lap, SBR, in discontinuity ,abthera placement (1/13)    52M known chronic mesenteric ischemia came to the ED on the 13th with increasing abdominal pain. CT obtained revealing severe mesenteric and celiac artery stenosis. EGS team was consulted and took him to the OR that same day. ICG small and large bowel angiography performed revealing necrosed mid-jejunum, approx 70 cm resected. Has since HDS off pressor with vascular planning to do angiogram w/ possible stenting today    Overnight events: Bowel visualized through the abthera. Coughing and the staples on the R side were dislodged from the outer sponge. 2 mg versed and 0.6mg  of rocuronium given and the sponges replaced.     Patient Active Problem List    Diagnosis Date Noted    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    Arm pain, left 10/04/2018    Acute respiratory failure with hypoxia (HCC) 09/25/2018    Severe sepsis (HCC) 09/25/2018    Olecranon bursitis of left elbow 09/25/2018    Cellulitis 09/23/2018    Atrial fibrillation with RVR (HCC) 09/23/2018    Coronary artery disease involving native coronary artery 09/23/2018    COPD (chronic obstructive pulmonary disease) (HCC) 09/23/2018    OSA (obstructive sleep apnea) 09/23/2018       Assessment & Plan:   Neuro   Acute pain due to trauma  Multimodal pain regimen:  -Tylenol IV 1000mg  q6h prn   -Fent 50 mcg q1h prn     Sedation  -Propofol     GCS 11 (E4,V1,M6)    CV   Undifferentiated shock versus hypovolemic versus septic, HTN, Afib (Xarelto), CAD s/p PCI w/ stent (2014), dilated cardiomyopathy  -Vital signs stable, continue to monitor. MAP > 65 off pressors   -Vital signs per unit protocol  -PTA xarelto held. ASA/statin held. Metoprolol 7.5 mg q6h. Digoxin 100 mcg  -Continue hep gtt   -Vasc planning for an angiogram, possible stent today     Pulm  Acute hypoxic respiratory failure, COPD, OSA, tobaccoism,   -Mode: V/AC+  Set Vt (ml):  [500 milliliters]   Expired Tidal Volume Spont (mL):  [413 milliliters-479 milliliters]   Set RR:  [18 breaths/minutes]   Total Respiratory Rate (Breaths/Min):  [18 breaths/minutes]   Minute Volume (L/min):  [8.94 liters/minutes-9.18 liters/minutes]   %MVspon:  [0 %]   PIP Actual:  [18 cm H20-19 cm H20]   PEEP/CPAP:  [5 cm H2O]   Mean Airway Pressure:  [8 cm H2O]   TV 500, RR 18,  PEEP 5,   -ABG 7.4/45/103/26.4. Possible extubating after the OR  -Vasc possible paralytic before cath lab.    GI/ Fluids / Electrolytes / Nutrition  Risk for constipation, Risk for electrolyte imbalance, chronic mesenteric ischemia, SMA stenosis s/p ex lap, SBR, in discontinuity ,abthera placement (1/13)  -Strict NPO. Protonix 40 mg daily. OG 750/24hrs  -Bowel regimen: senna, miralax (held)   -Abthera in place. Holding suction well   -Zofran prn nausea    GU    -Cr 1.14(1.00). UOP 1200  -Maintain foley     Heme/ID  Acute blood loss anemia  - Hgb 13.3(13.8). No clinical signs of overt  bleeding. Continue to trend serially. Optimize fluid balance. Monitor stools for signs of occult GI bleeding.    Leukocytosis  -WBC 18(16.4). Afebrile   -Cultures 1/13   Urine, no growth. Fecal rotavirus, cryptosporidium, cdiff negative     Endo  -No acute issues. Monitor and treat prn. Glucose 145    MS  Impaired mobility and activities of daily living  -PT/OT    Daily Quality Checklist  Lines:  2 Peripheral IV   Foley Cath  Art line  OG  Activity progressive mobility   GI Prophylaxis: protonix  DVT Prophylaxis: held   Nutrition: strict NPO   Bowel Regimen: held  Lab Frequency: Daily   Daily CXR: No  Disposition: SICU    Subjective    Resting comfortably. Intubated Objective:    Physical Exam:  General: intubated, sedated   HEENT: Normocephalic, atraumatic; mucus membranes moist  Lungs:  intubated   Heart: regular rate   Abdomen:  Soft, abthera in place to suction   Extremities:  No edema; Warm, without cyanosis    Labs:    Complete Blood Counts   Recent Labs     10/10/23  0150 10/10/23  2042 10/11/23  0331   HGB 15.3 13.8 13.3*   HCT 46.0 40.3 40.0   WBC 18.50* 16.40* 18.00*   PLTCT 376 338 318   MCV 84.7 83.6 85.2   MCH 28.2 28.6 28.3   MCHC 33.3 34.2 33.3   RDW 14.4 14.4 14.9   MPV 8.5 8.5 8.4     Recent Labs     10/10/23  0150   NEUT 85*        Chemistry Panel   Recent Labs     10/10/23  0150 10/10/23  0158 10/10/23  2042 10/11/23  0331   NA 135*  --  133* 132*   K 4.5  --  3.7 4.0   CL 95*  --  94* 94*   CO2 24  --  27 27   BUN 16  --  16 19   CR 1.18 1.4* 1.00 1.14   GLU 121*  --  155* 145*   GAP 16*  --  12 11   GFR >60  --  >60 >60   MG 1.6  --  1.3* 2.4   CA 9.1  --  8.6 8.6   PO4 3.5  --  3.9 4.1     Recent Labs     10/10/23  0150 10/10/23  2042   ALKPHOS 122* 97   AST 23 12   ALT 10 10   TOTPROT 7.5 6.2   TOTBILI 1.3 1.3   ALBUMIN 3.8 3.2*   LIPASE 26  --         Coagulation Studies   Recent Labs     10/10/23  0509 10/10/23  1113 10/10/23  2042 10/11/23  0331   PTT 34.9 67.2* 37.6* 66.3*          Vital Signs (24 hours)  BP: (117-198)/(79-111)   ABP: (101-198)/(53-85)   Temp:  [36.1 ?C (97 ?F)-37.5 ?C (99.5 ?F)]   Pulse:  [71-113]   Respirations:  [13 PER MINUTE-25 PER MINUTE]   SpO2:  [91 %-98 %]   O2%:  [40 %-100 %]   O2 Device: Ventilator  O2 Liter Flow: 2 Lpm    Medications  Scheduled Meds:[Held by Provider] amLODIPine (NORVASC) tablet 5 mg, 5 mg, Oral, QDAY  [Held by Provider] aspirin EC (ASPIR-LOW) tablet 81 mg, 81  mg, Oral, QDAY  [Held by Provider] atorvastatin (LIPITOR) tablet 40 mg, 40 mg, Oral, QDAY  [Held by Provider] busPIRone (BUSPAR) tablet 15 mg, 15 mg, Oral, TID  digoxin (LANOXIN) injection 100 mcg, 100 mcg, Intravenous, QDAY  [Held by Provider] lisinopriL (ZESTRIL) tablet 20 mg, 20 mg, Oral, BID  metoprolol (LOPRESSOR) injection 7.5 mg, 7.5 mg, Intravenous, Q6H*  pantoprazole (PROTONIX) injection 40 mg, 40 mg, Intravenous, QDAY  [Held by Provider] pregabalin (LYRICA) capsule 200 mg, 200 mg, Oral, TID  [Held by Provider] rOPINIRole (REQUIP) tablet 1 mg, 1 mg, Oral, QHS  SODIUM CHLORIDE 0.9% IV SOLP (Cabinet Override), , , NOW  [Held by Provider] venlafaxine XR (EFFEXOR XR) capsule 150 mg, 150 mg, Oral, QDAY    Continuous Infusions:   heparin (porcine) 20,000 units/D5W 500 mL infusion (std conc)(premade) 1,646 Units/hr (10/11/23 0717)    propofoL (DIPRIVAN) 10 mg/mL IV drip 35 mcg/kg/min (10/11/23 0703)     PRN and Respiratory Meds:acetaminophen (OFIRMEV) IV Q6H PRN, albuterol-ipratropium Q4H PRN, fentaNYL citrate PF Q1H PRN, heparin (porcine) TITRATE **AND** heparin (porcine) Q6H PRN, nalOXone PRN, ondansetron Q6H PRN **OR** ondansetron (ZOFRAN) IV Q6H PRN, [Held by Provider] polyethylene glycol 3350 QDAY PRN, [Held by Provider] sennosides-docusate sodium QDAY PRN      I have personally reviewed pertinent labs, medications, radiology, and diagnostic procedures including: active problem list, medication list, allergies, family history, social history, health maintenance, notes from last encounter, lab results, imaging.     Jonette Eva, MD  Electronically Signed  10/11/2023 7:56 AM  Available on [employer]ate

## 2023-10-11 NOTE — Unmapped
 Brief Procedure Note    Date:  10/11/2023 8:32 AM     Procedure: Right radial arterial line insertion    Primary Surgeon: Dr. Mayford Knife, MD  Assistant: Reubin Milan, MD    Indications:  mesenteric ischemia, need for hemodynamic monitoring and frequent arterial blood draws.     Description: The patient's right wrist and hand were prepped with chloroprep. A 20 Gauge arterial catheter was inserted into the radial artery. One needle stick was required. Appropriate position was confirmed with the presence of pulsatile, bright red blood return and appropriate wave form on the monitor. A sterile dressing was placed over the arterial line and the procedure was concluded.    Dr. Mayford Knife was immediately available for the procedure.    Reubin Milan, MD  Emergency Medicine, PGY-1

## 2023-10-11 NOTE — Operative Report(Direct Entry)
 OPERATIVE REPORT    Name: Blake Decker is a 73 y.o. 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     Specimen: none   EBL: minimal       Radiology Codes:    #1 aortogram radiology S and I  #2 selective visceral angiogram SMA  #3 selective visceral angiogram celiac and branches       Procedures:  1) 1st order catheter placement in SMA and SMA stent 6 x 22 iCast covered stent graft   2) separate 2nd order catheter placement hepatic artery and celiac stent 6 x 22 iCast post-dilated to 7mm       Procedural Sedation under Surgeon and RN supervision using appropriate monitoring devices:    Pt was intubated with open abdomen transferred from SICU  He was maintained on propofol drip and given extra 1mg  versed at the origin of the case   Total 56 minutes.    Findings:  #1 aorta widely patent     #2 very high grade 99% proximal SMA stenosis with patent distal vessel and success treatment of stenosis with 6 x 22 iCast covered stent graft with no residual stenosis and     #3 high grade 60% stenosis in proximal vessel, distal left gastric and hepatic widely patent        Treated with 6 x 22 iCast covered stent graft post-dilated to 7mm     #4 widely patent stents on completion angiogram     #5 good pulse ox signal left index finger with TR band in place           TECHNICAL PROCEDURE:    The patient was escorted to the endovascular suite and placed supine on the table.  Left arm and left radial arterial line were prepped and draped in the usual sterile fashion.  An appropriate timeout was performed.    Attention was directed to the left wrist where the left radial arterial line was used to introduce the micro wire from the 4/5 Slender kit.   The Seldinger technique was used to exchange the arterial line for a Terumo 4/5 Slender sheath which was aspirated and flushed without difficulty.  As this was done the patient was given IV heparin and the sheath was immediately used to deliver radial cocktail.      This was used to introduce a Pollyann Kennedy then a [employer] wire into the Thoracic under fluoroscopic guidance.  This was used to introduce a flush catheter into the arch and this was used to select the descending aorta and pass the wire wire then the catheter into the descending aorta.     The catheter was advanced down to just above the visceral segment of the aorta and the image intensifier was placed in a lateral position.    An aortogram in a lateral  position was obtained.    I exchanged for a 100cm angled Beacon tip vertebral catheter and use this and a glidewire to select the SMA and perform a selective angiogram of the SMA.  The catheter was then used to pass a Supra-core wire into the SMA and the catheter and sheath were removed and the wire was used to pass a 105cm Terumo RTP sheath out into the proximal SMA.  I used this to deliver a 6 x 22 iCast covered stent graft and deployed this in the proximal SMA extending slightly into the aorta int the standard fashion.  Angiograms then showed stent widely patent.     I pulled the sheath back and used a long angled navicross and the glidewire to cross the celiac stenosis and pass the glide wire and the catheter into the hepatic artery (2nd order).  A selective angiogram was obtained and the catheter was used to pass a Supracore wire into the hepatic artery.    I used this with the dilator to pass the sheath into the celiac artery and deliver another 6 x 22 iCast covered stent graft.  I post-dilated this with a 7mm balloon and angiogram showed both stents widely patent.    The wire was used to replace the dilator in the sheath and together these were removed and a TR band placed with good hemostasis and a good pulse -ox signal in the left index finger.    The patient was taken back to SICU in stable condition     Hilton Sinclair, MD  Pager

## 2023-10-11 NOTE — Case Management (ED)
 Case Management Admission Assessment    NAME:Blake Decker                          MRN: [id number]             DOB:Sep 29, 1950          AGE: 73 y.o.  ADMISSION DATE: 10/10/2023             DAYS ADMITTED: LOS: 1 day      Today?s Date: 10/11/2023    Source of Information: spouse Blake Decker, EMR       Plan  Plan: Case Management Assessment, Assist PRN with SW/NCM Services, Discharge Planning for Home with Post-Acute Care Needs, Discharge Planning for Post-Acute Facility    This CM met with pt for assessment on this date.  Provided contact information and explanation of SW/NCM roles.  Reviewed [facility], Preparing for Discharge, and [facility] hand-outs.  Provided opportunity for questions and discussion. Pt/family encouraged to contact Case Management team with questions and concerns during hospitalization and until patient is able to transition back to the patient's primary care physician.    *NCM visited with patient's spouse Blake Decker at bedside.  *Role of NCM explained and business card provided.  *Spouse states he's mostly independent with self-care & ADL's but did recently break his ankle and is NWB on that leg. Patient would require assistance at home from spouse.   *DC Transport:  Pts spouse, Blake Decker [phone]    *DME:  manual wheelchair, walker, SPC  *HH / HI:  [facility] about 8 years ago. Would return.   *SNF / IPR / LTACH:  [facility] recently; would return.   *No NCM needs identified at this time. NCM encouraged pt to reach out if any needs should change. Pt verbalized understanding.  *NCM will continue to follow and assess for additional needs through discharge.      Patient Address/Phone  [address]  [place] [address]  [phone] (home)     Emergency Contact  Extended Emergency Contact Information  Primary Emergency Contact: Blake Decker  Home Phone: [phone]  Mobile Phone: [phone]  Relation: Spouse  Interpreter needed? No    Healthcare Directive Transportation  Does the Patient Need Case Management to Arrange Discharge Transport? (ex: facility, ambulance, wheelchair/stretcher, Medicaid, cab, other): No  Will the Patient Use Family Transport?: Yes  Transportation Name, Phone and Availability #1:  (spouse Blake Decker - [phone])    Expected Discharge Date  10/15/2023     Living Situation Prior to Admission  Living Arrangements  Type of Residence: Home, independent  Living Arrangements: Spouse/significant other  Financial risk analyst / Tub: Tub/Shower Unit  How many levels in the residence?: 1  Can patient live on one level if needed?: Yes  Does residence have entry and/or inside stairs?: No  Assistance needed prior to admit or anticipated on discharge: No  Who provides assistance or could if needed?:  (spouse - Blake Decker [phone])  Are they in good health?: Yes  Can support system provide 24/7 care if needed?: Maybe  Level of Function   Prior level of function: Independent  Cognitive Abilities   Cognitive Abilities: Unable to Assess (assessment with spouse Blake Decker)    Architect  Coverage     Medication Coverage       Source of Income   Source Of Income: SSI, Other retirement income  Financial Assistance Needed?  None identified at this  time     Psychosocial Needs  Mental Health  Mental Health History: No  Substance Use History  Substance Use History Screen: Yes  Comment:  (quit smoking 09/27/23. smoked a PPD. denies alcohol or other substances.)  Other  None identified at this time     Current/Previous Services  PCP  Blake Decker, [phone], [phone]  Pharmacy    [facility] [phone] - [place], [place] - [address]  [address] [place]  [place] [address]  Phone: [phone] Fax: [phone]    Durable Medical Equipment   Durable Medical Equipment at home: Wheelchair (manual), CPAP/BiPAP, Roller Walker, Single [employer]  Receiving home health: In the past  Agency name:  ([facility])  Would patient use this agency again?: Yes  Hemodialysis or Peritoneal Dialysis  Undergoing hemodialysis or peritoneal dialysis: No  Tube/Enteral Feeds  Receive tube/enteral feeds: No  Infusion  Receive infusions: No  Private Duty  Private duty help used: No  Home and Community Based Services  Home and community based services: No  Ryan White  Ryan White: N/A  Hospice  Hospice: No  Outpatient Therapy  PT: No  OT: No  SLP: No  Skilled Nursing Facility/Nursing Home  SNF: No  NH: No  Inpatient Rehab  IPR: In the past  When did patient receive care?:  (09/27/23 - 10/05/23)  Name of Facility:  ([facility] [place])  Would patient return for future services?: Yes  Long-Term Acute Care Hospital  LTACH: No  Acute Hospital Stay  Acute Hospital Stay: In the past  Was patient's stay within the last 30 days?: No      Teodoro Spray RN, BSN, NCM  Integrated Nurse Case Manager  Phone: [phone]  Pager: [phone]

## 2023-10-11 NOTE — Progress Notes
 CLINICAL NUTRITION                                                        Clinical Nutrition Initial Assessment    Name: Blake Decker   MRN: [id number]     DOB: 02/13/1951      Age: 73 y.o.  Admission Date: 10/10/2023     LOS: 1 day     Date of Service: 10/11/2023      Recommendation:  With poor PO intakes x2 weeks leading up to admission and in discontinuity, if unable to anastomose and safely start EN vs PO (if extubated), low threshold for TPN initiation.     If able to anastomose and safely initiate EN, start with trickle feeds and eventual goal with propofol of Peptamen Intense VHP at 40 ml/hr +2 pk prosource protein/day (1 pk BID). 24-hr goal volume 960 ml and 4-hr goal volume 160 ml. At goal would provide 1120 kcals (additional kcals from propofol), 128 g protein, and 806 ml free H2O.     Comments:  Patient is a 73 y.o. male with a PMH that includes HTN, Afib, CAD s/p PCI w/ stent (2014), dilated cardiomyopathy, PVD, COPD, OSA, tobaccoism who presented with increasing abdominal pain. CT obtained revealing severe mesenteric and celiac artery stenosis and patient was taken to OR 1/13 and is s/p ex lap, SBR (approximately 70 cm of necrotic jejunum noted to be resected with perforation), in discontinuity with abthera placement.  With worsening abdominal pain over the past 2 weeks, pt with poor PO intake noted and was with 10 daily loose stools on average. Care Everywhere shows weight of 255 lbs 08/19/23 and 250 lbs 09/30/23, and now current bed wt 10/10/23 at 241 lbs (~4.6% loss over past 2 weeks). Pt is intubated and sedated on propofol, current rate 31.8 ml/hr (~839 kcals/24hrs. On levophed.      Nutrition Assessment of Patient:  Desired Weight: 83.3 kg (IBW per hamwai = 80.9kg)  BMI (Calculated): 35.25;      Pertinent Allergies/Intolerances: none noted     Oral Diet Order: NPO;       Current Energy Intake: NPO              Estimated Calorie Needs: 1600-2000 (80-100% modified SU, consistent with ~19-25 kcals/kg DBW 83.3kg)  Estimated Protein Needs: 120-160 (1.5-2 g/kg IBW 80.9kg)    Malnutrition Assessment:   Malnutrition present on admission  ICD-10 code E44: Acute illness/Moderate non-severe malnutrition  Energy Intake: Less than 75% of estimated energy requirement for greater than 7days, Weight loss: 1-2% x 1 week              Malnutrition Interventions: Nutrition assessment, avoid prolonged NPO status, monitor nutrition plans.    Nutrition Focused Physical Exam:  Loss of Subcutaneous Fat: No;      Muscle Wasting: No;        Physical Assessment:  Pressure Injury: stage 1 sacrum    Nutrition Diagnosis:  Inadequate protein-energy intake  Etiology: intubated, abdominal pain, diarrhea  Signs & Symptoms: NPO status, weight loss, EMR review/report.    Intervention / Plan:  Will monitor NPO status/nutrition plans, wt trends, labs, meds, GI symptoms, skin integrity.      Willette Cluster, MA, RD, LD, CNSC  Available on [facility]

## 2023-10-12 ENCOUNTER — Encounter: Admit: 2023-10-12 | Discharge: 2023-10-12 | Payer: MEDICARE

## 2023-10-12 ENCOUNTER — Inpatient Hospital Stay: Admit: 2023-10-12 | Discharge: 2023-10-12 | Payer: MEDICARE

## 2023-10-12 LAB — PTT (APTT)
~~LOC~~ BKR PTT: 59 s — ABNORMAL HIGH (ref 24.0–36.5)
~~LOC~~ BKR PTT: 62 s — ABNORMAL HIGH (ref 24.0–36.5)

## 2023-10-12 LAB — HIGH SENSITIVITY TROPONIN I 4 HR
~~LOC~~ BKR HI SEN TNI DELTA 4-2: -2 g/dL (ref 32.0–36.0)
~~LOC~~ BKR HIGH SENSITIVITY TROPONIN I 4 HOUR: 12 ng/L (ref 26.0–<20)

## 2023-10-12 LAB — HIGH SENSITIVITY TROPONIN I 2 HOUR
~~LOC~~ BKR HIGH SENSITIVITY TROPONIN I 2 HOUR: 14 ng/L (ref <20)
~~LOC~~ BKR HIGH SENSITIVITY TROPONIN I DELTA VALUE: 2

## 2023-10-12 LAB — SODIUM: ~~LOC~~ BKR SODIUM, SERUM: 131 mmol/L — ABNORMAL LOW (ref 137–147)

## 2023-10-12 LAB — SHIGA TOXINS EIA

## 2023-10-12 LAB — CBC
~~LOC~~ BKR HEMATOCRIT: 40 % (ref 40.0–50.0)
~~LOC~~ BKR HEMOGLOBIN: 13 g/dL — ABNORMAL LOW (ref 13.5–16.5)
~~LOC~~ BKR MCH: 27 pg — ABNORMAL HIGH (ref 26.0–34.0)
~~LOC~~ BKR MCHC: 32 g/dL — ABNORMAL LOW (ref 32.0–36.0)
~~LOC~~ BKR MCV: 84 fL — ABNORMAL HIGH (ref 80.0–100.0)
~~LOC~~ BKR MPV: 8.2 fL — ABNORMAL LOW (ref 7.0–11.0)
~~LOC~~ BKR PLATELET COUNT: 356 10*3/uL — AB (ref 150–400)
~~LOC~~ BKR RBC COUNT: 4.7 10*6/uL — ABNORMAL HIGH (ref 4.40–5.50)
~~LOC~~ BKR RDW: 14 % (ref 11.0–15.0)
~~LOC~~ BKR WBC COUNT: 13 10*3/uL — ABNORMAL HIGH (ref 4.50–11.00)

## 2023-10-12 LAB — BLOOD GASES, ARTERIAL: ~~LOC~~ BKR BASE EXCESS-ART: 2.6 mmol/L — ABNORMAL HIGH (ref 3.5–5.1)

## 2023-10-12 MED ORDER — OXYCODONE 5 MG PO TAB
5 mg | Freq: Once | ORAL | 0 refills | Status: CN | PRN
Start: 2023-10-12 — End: ?

## 2023-10-12 MED ORDER — POTASSIUM CHLORIDE IN WATER 10 MEQ/50 ML IV PGBK
10 meq | INTRAVENOUS | 0 refills | Status: CP
Start: 2023-10-12 — End: ?
  Administered 2023-10-12: 14:00:00 10 meq via INTRAVENOUS

## 2023-10-12 MED ORDER — POTASSIUM CHLORIDE IN WATER 10 MEQ/50 ML IV PGBK
10 meq | INTRAVENOUS | 0 refills | Status: CP
Start: 2023-10-12 — End: ?
  Administered 2023-10-12: 15:00:00 10 meq via INTRAVENOUS

## 2023-10-12 MED ORDER — POTASSIUM CHLORIDE IN WATER 10 MEQ/50 ML IV PGBK
10 meq | INTRAVENOUS | 0 refills | Status: CP
Start: 2023-10-12 — End: ?
  Administered 2023-10-12: 12:00:00 10 meq via INTRAVENOUS

## 2023-10-12 MED ORDER — HYDROMORPHONE (PF) 2 MG/ML IJ SYRG
.5 mg | INTRAVENOUS | 0 refills | Status: CN | PRN
Start: 2023-10-12 — End: ?

## 2023-10-12 MED ORDER — FENTANYL DRIP IN NS 1000MCG/100ML
10-50 ug/h | INTRAVENOUS | 0 refills | Status: DC
Start: 2023-10-12 — End: 2023-10-13
  Administered 2023-10-12 (×2): 50 ug/h via INTRAVENOUS

## 2023-10-12 MED ORDER — FENTANYL CITRATE (PF) 50 MCG/ML IJ SOLN
25 ug | INTRAVENOUS | 0 refills | Status: CN | PRN
Start: 2023-10-12 — End: ?

## 2023-10-12 MED ORDER — POTASSIUM CHLORIDE IN WATER 10 MEQ/50 ML IV PGBK
10 meq | INTRAVENOUS | 0 refills | Status: CP
Start: 2023-10-12 — End: ?
  Administered 2023-10-12: 13:00:00 10 meq via INTRAVENOUS

## 2023-10-12 MED ORDER — NALOXONE 0.4 MG/ML IJ SOLN
.08 mg | INTRAVENOUS | 0 refills | Status: DC | PRN
Start: 2023-10-12 — End: 2023-10-15

## 2023-10-12 MED ORDER — SODIUM CHLORIDE 0.9% IV SOLP
500 mL | INTRAVENOUS | 0 refills | Status: CP
Start: 2023-10-12 — End: ?
  Administered 2023-10-12: 16:00:00 500 mL via INTRAVENOUS

## 2023-10-12 MED ORDER — FENTANYL CITRATE (PF) 50 MCG/ML IJ SOLN
50 ug | INTRAVENOUS | 0 refills | Status: CN | PRN
Start: 2023-10-12 — End: ?

## 2023-10-12 MED ORDER — HALOPERIDOL LACTATE 5 MG/ML IJ SOLN
1 mg | Freq: Once | INTRAVENOUS | 0 refills | Status: CN | PRN
Start: 2023-10-12 — End: ?

## 2023-10-12 MED ORDER — SODIUM CHLORIDE 0.9% IV SOLP
500 mL | INTRAVENOUS | 0 refills | Status: CP
Start: 2023-10-12 — End: ?
  Administered 2023-10-12: 22:00:00 500 mL via INTRAVENOUS

## 2023-10-12 NOTE — Progress Notes
 Surgical Critical Care Progress Note    Today's Date: 10/12/2023   Hospital Day: Hospital Day: 3    History of Present Illness:   Blake Decker is a 73 y.o. 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, SMA stenosis s/p ex lap, SBR, in discontinuity ,abthera placement (1/13) angio SMA, hepatic artery, celiac stent  (1/14)    3M known chronic mesenteric ischemia came to the ED on the 13th with increasing abdominal pain. CT obtained revealing severe mesenteric and celiac artery stenosis. EGS team was consulted and took him to the OR that same day. ICG small and large bowel angiography performed revealing necrosed mid-jejunum, approx 70 cm resected. Has since HDS off pressor with vascular planning to do angiogram w/ possible stenting today    Overnight events 1/14: Bowel visualized through the abthera. Coughing and the staples on the R side were dislodged from the outer sponge. 2 mg versed and 0.6mg  of rocuronium given and the sponges replaced.     Overnight events 1/15: Fent gtt was added for pain controlled. On .08 levo     Patient Active Problem List    Diagnosis Date Noted    Moderate malnutrition (HCC) 10/11/2023    Abdominal pain, generalized 10/11/2023    Diarrhea 10/11/2023    Mesenteric ischemia (HCC) 10/11/2023    Atrial fibrillation (HCC) 10/11/2023    Acute blood loss anemia 10/11/2023    Severe sepsis with septic shock (HCC) 10/11/2023    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    Arm pain, left 10/04/2018    Acute respiratory failure with hypoxia (HCC) 09/25/2018    Severe sepsis (HCC) 09/25/2018    Olecranon bursitis of left elbow 09/25/2018    Cellulitis 09/23/2018    Atrial fibrillation with RVR (HCC) 09/23/2018    Coronary artery disease involving native coronary artery 09/23/2018    COPD (chronic obstructive pulmonary disease) (HCC) 09/23/2018    OSA (obstructive sleep apnea) 09/23/2018       Assessment & Plan:   Neuro   Acute pain due to trauma  Multimodal pain regimen:  -Tylenol IV 1000mg  q6h prn   -Fent gtt    Sedation  -Propofol     GCS 10 (E4,V1,M5)    CV   Undifferentiated shock versus hypovolemic versus septic, HTN, Afib (Xarelto), CAD s/p PCI w/ stent (2014), dilated cardiomyopathy, angio SMA, hepatic artery, celiac stent (1/14)  -Vital signs stable, continue to monitor. MAP > 65. Levo ranging from 0.08-0.1  -Vital signs per unit protocol  -PTA xarelto held. ASA/statin held. Metoprolol 7.5 mg q6h. Digoxin 100 mcg  -Continue hep gtt   -Mesenteric stens placed per vasc (1/14). Okay for hep gtt. Eventual ASA/Plavix. Plavix held, ASA rectal given discontinuity     Pulm  Acute hypoxic respiratory failure, COPD, OSA, tobaccoism   -Mode: V/AC+  Set Vt (ml):  [500 milliliters]   Expired Tidal Volume Spont (mL):  [498 milliliters]   Set RR:  [18 breaths/minutes]   Total Respiratory Rate (Breaths/Min):  [18 breaths/minutes]   Minute Volume (L/min):  [8.98 liters/minutes]   %MVspon:  [0 %]   PIP Actual:  [19 cm H20]   PEEP/CPAP:  [5 cm H2O]   Mean Airway Pressure:  [8 cm H2O]   TV 500, RR 18,  PEEP 5,   -ABG 7.4/43/82/26.7    GI/ Fluids / Electrolytes / Nutrition  Risk for constipation, Risk for electrolyte imbalance,  chronic mesenteric ischemia, SMA stenosis s/p ex lap, SBR, in discontinuity ,abthera placement (1/13)  -Strict NPO, in discontinuity. Protonix 40 mg daily. OG 1280/24hrs  -Bowel regimen: senna, miralax (held)   -Abthera in place. Holding suction well   -Zofran prn nausea    GU    -Cr 1.44(1.36). UOP 1133  -Maintain foley     Heme/ID  Acute blood loss anemia  - Hgb 13.1(13.3). No clinical signs of overt bleeding. Continue to trend serially. Optimize fluid balance. Monitor stools for signs of occult GI bleeding.    Leukocytosis  -WBC 13(16.7). Afebrile   -Cultures 1/13   Urine, no growth. Fecal rotavirus, cryptosporidium, cdiff negative     Endo  -No acute issues. Monitor and treat prn. Glucose 154    MS  Impaired mobility and activities of daily living  -PT/OT    Daily Quality Checklist  Lines:  3 Peripheral IV   R IJ central line   Foley Cath  Art line  OG  Activity progressive mobility   GI Prophylaxis: protonix  DVT Prophylaxis: held   Nutrition: strict NPO, in discontinuity   Bowel Regimen: held  Lab Frequency: Daily   Daily CXR: No  Disposition: SICU    Subjective    Resting comfortably. Intubated     Objective:    Physical Exam:  General: intubated, sedated   HEENT: Normocephalic, atraumatic; mucus membranes moist  Lungs:  intubated   Heart: regular rate   Abdomen:  Soft, abthera in place to suction   Extremities:  No edema; Warm, without cyanosis    Labs:    Complete Blood Counts   Recent Labs     10/11/23  0331 10/11/23  1112 10/11/23  1512 10/12/23  0317   HGB 13.3* 13.2* 13.3* 13.1*   HCT 40.0 38.8* 41.2 40.4   WBC 18.00* 17.30* 16.70* 13.00*   PLTCT 318 293 324 356   MCV 85.2 84.2 84.7 84.2   MCH 28.3 28.6 27.3 27.3   MCHC 33.3 34.0 32.2 32.4   RDW 14.9 14.6 14.4 14.6   MPV 8.4 8.6 8.5 8.2     Recent Labs     10/10/23  0150   NEUT 85*        Chemistry Panel   Recent Labs     10/10/23  2042 10/11/23  0331 10/11/23  1112 10/11/23  1421 10/11/23  1846 10/12/23  0317   NA 133* 132* 129* 132* - 132* 131* 131*   K 3.7 4.0 3.9 3.7  --  3.5   CL 94* 94* 93* 93*  --  94*   CO2 27 27 26 24   --  27   BUN 16 19 22 23   --  25   CR 1.00 1.14 1.48* 1.36*  --  1.44*   GLU 155* 145* 113* 153*  --  154*   GAP 12 11 10  15*  --  10   GFR >60 >60 50* 56*  --  52*   MG 1.3* 2.4 2.0  --   --  2.1   CA 8.6 8.6 8.5 9.0  --  8.5   PO4 3.9 4.1 5.0*  --   --  4.6*     Recent Labs     10/10/23  0150 10/10/23  2042 10/11/23  1112   ALKPHOS 122* 97 92   AST 23 12 18    ALT 10 10 12    TOTPROT 7.5 6.2 5.9*   TOTBILI 1.3 1.3 1.7*  ALBUMIN 3.8 3.2* 3.0*   LIPASE 26  --   --         Coagulation Studies   Recent Labs     10/11/23  0331 10/11/23  1112 10/11/23  1955 10/12/23  0153   PTT 66.3* 87.3* 59.0* 62.0*   INR  --  1.8*  --   --           Vital Signs (24 hours)  ABP: (73-170)/(35-66)   Temp:  [36.3 ?C (97.3 ?F)-37.4 ?C (99.3 ?F)]   Pulse:  [73-105]   Respirations:  [16 PER MINUTE-31 PER MINUTE]   SpO2:  [95 %-99 %]   O2%:  [40 %]   O2 Device: Ventilator    Medications  Scheduled Meds:[Held by Provider] amLODIPine (NORVASC) tablet 5 mg, 5 mg, Oral, QDAY  [Held by Provider] aspirin EC (ASPIR-LOW) tablet 81 mg, 81 mg, Oral, QDAY  aspirin rectal suppository 300 mg, 300 mg, Rectal, QDAY  [Held by Provider] atorvastatin (LIPITOR) tablet 40 mg, 40 mg, Oral, QDAY  [Held by Provider] busPIRone (BUSPAR) tablet 15 mg, 15 mg, Oral, TID  digoxin (LANOXIN) injection 100 mcg, 100 mcg, Intravenous, QDAY  [Held by Provider] lisinopriL (ZESTRIL) tablet 20 mg, 20 mg, Oral, BID  [Held by Provider] metoprolol (LOPRESSOR) injection 7.5 mg, 7.5 mg, Intravenous, Q6H*  pantoprazole (PROTONIX) injection 40 mg, 40 mg, Intravenous, QDAY  potassium chloride in water IVPB 10 mEq, 10 mEq, Intravenous, Q30 MIN   Followed by  potassium chloride in water IVPB 10 mEq, 10 mEq, Intravenous, Q30 MIN   Followed by  potassium chloride in water IVPB 10 mEq, 10 mEq, Intravenous, Q30 MIN   Followed by  potassium chloride in water IVPB 10 mEq, 10 mEq, Intravenous, Q30 MIN   Followed by  potassium chloride in water IVPB 10 mEq, 10 mEq, Intravenous, Q30 MIN  [Held by Provider] pregabalin (LYRICA) capsule 200 mg, 200 mg, Oral, TID  [Held by Provider] rOPINIRole (REQUIP) tablet 1 mg, 1 mg, Oral, QHS  [Held by Provider] venlafaxine XR (EFFEXOR XR) capsule 150 mg, 150 mg, Oral, QDAY    Continuous Infusions:   fentaNYL (SUBLIMAZE) 1000 mcg/100 mL NS IV drip (std conc)(premade) 50 mcg/hr (10/12/23 0114)    heparin (porcine) 20,000 units/D5W 500 mL infusion (std conc)(premade) 1,700 Units/hr (10/12/23 0015)    norepinephrine (LEVOPHED) 4 mg in dextrose 5% (D5W) 250 mL IV drip (std conc) 0.08 mcg/kg/min (10/12/23 0442)    propofoL (DIPRIVAN) 10 mg/mL IV drip 35 mcg/kg/min (10/12/23 0427)     PRN and Respiratory Meds:acetaminophen (OFIRMEV) IV Q6H PRN, albuterol-ipratropium Q4H PRN, nalOXone PRN, nalOXone PRN, ondansetron Q6H PRN **OR** ondansetron (ZOFRAN) IV Q6H PRN, [Held by Provider] polyethylene glycol 3350 QDAY PRN, [Held by Provider] sennosides-docusate sodium QDAY PRN      I have personally reviewed pertinent labs, medications, radiology, and diagnostic procedures including: active problem list, medication list, allergies, family history, social history, health maintenance, notes from last encounter, lab results, imaging.     Jonette Eva, MD  Electronically Signed  10/12/2023 6:21 AM  Available on [employer]

## 2023-10-12 NOTE — Progress Notes
 OCCUPATIONAL THERAPY  NOTE   Name: Blake Decker   MRN: [id number]     DOB: [birth year]      Age: 73 y.o.  Admission Date: 10/10/2023     LOS: 2 days     Date of Service: 10/12/2023      Patient with open abdomen, planning to return to OR tomorrow. OT/PT will f/u 1/17.    Therapist: Nelma Rothman, OTR/L [id number]  Date: 10/12/2023

## 2023-10-12 NOTE — Progress Notes
 Adult Mechanical Ventilator Liberation    Name: Blake Decker   MRN: [id number]     DOB: [birth year]      Age: 73 y.o.  Admission Date: 10/10/2023     LOS: 2 days     Date of Service: 10/12/2023        Adult Mechanical Ventilator Liberation: Twice daily     Weaning Readiness Screen Met (RT Only):: No, patient failed SAT and RASS is >1 or <-2  Patient Having Procedure Requiring Intubation in the Next 12 Hours?  (Twice Daily): Yes    Patient currently has a RASS of +1.  OR planned for today.

## 2023-10-12 NOTE — Progress Notes
 Daily Progress Note      Date of Service:  10/12/2023  Name:  Blake Decker                       MRN:  [id number]   Admission Date: 10/10/2023 (LOS: 2 days)                     Assessment: Blake Decker is a 73 y.o. 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 Continues to have pedal signals on exam  - Distended, ttp, and requiring pressors. May have component of reperfusion injury after stent placement yesterday.   - Vent wean per ICU  - NPO, in discontinuity  - Continue hep gtt as he is on Xarelto at baseline due to Afib  - Will need ASA for stents. No need for plavix while being anticoagulated  - SICU team planning for return to the OR tomorrow  - Please page [phone] with questions, concerns, or changes in clinical status.       Discussed with Dr. Phil Decker  _____________________________________________________________________________    Subjective:  Increased pressor requirement throughout the evening and overnight.     Objective:  ABP: (73-170)/(35-66)   Temp:  [36.3 ?C (97.3 ?F)-37.4 ?C (99.3 ?F)]   Pulse:  [75-105]   Respirations:  [16 PER MINUTE-31 PER MINUTE]   SpO2:  [95 %-99 %]   O2%:  [40 %]   O2 Device: Ventilator  Body mass index is 32.79 kg/m?Marland Kitchen    Lab Results   Component Value Date/Time    HGB 13.1 (L) 10/12/2023 03:17 AM    HCT 40.4 10/12/2023 03:17 AM    WBC 13.00 (H) 10/12/2023 03:17 AM    PLTCT 356 10/12/2023 03:17 AM    INR 1.8 (H) 10/11/2023 11:12 AM Lab Results   Component Value Date/Time    NA 131 (L) 10/12/2023 03:17 AM    K 3.5 10/12/2023 03:17 AM    CL 94 (L) 10/12/2023 03:17 AM    CO2 27 10/12/2023 03:17 AM    BUN 25 10/12/2023 03:17 AM    CR 1.44 (H) 10/12/2023 03:17 AM    MG 2.1 10/12/2023 03:17 AM    PO4 4.6 (H) 10/12/2023 03:17 AM    CA 8.5 10/12/2023 03:17 AM     No results found for: GLUPOC       Physical Exam  General: Intubated and sedated. No acute distress  Head: Normocephalic, without obvious abnormality, atraumatic. NGT in place with dark output  Eyes: PERRL, EOMI, no scleral icterus  Neck: Supple, symmetrical, trachea midline  Lungs: Mechanically ventilated. Symmetric chest rise. Synchronous on vent.   Heart: Regular rate. Normotensive. Palpable radial pulses. Pedal signals on exam  Abdomen: Soft, distended. Abthera in place. TTP  Skin: Skin color, texture, turgor normal. No rashes or lesions.  Extremity: No clubbing, cyanosis, or edema  Neurologic: Sedated      ICD-10 code E44: Acute illness/Moderate non-severe malnutrition  Energy Intake: Less than 75% of estimated energy requirement for greater than 7days, Weight loss: 1-2% x 1 week        Loss of Subcutaneous Fat: No      Muscle Wasting: No             Malnutrition Interventions: Nutrition assessment, avoid prolonged NPO status, monitor nutrition plans.    Active Wounds          Wounds Pressure injury Mid Sacrum (Active)   10/10/23 1655   Wound Type: Pressure injury   Orientation: Mid   Location: Sacrum   Wound Location Comments:    Initial Wound Site Closure:    Initial Dressing Placed:    Initial Cycle:    Initial Suction Setting (mmHg):    Pressure Injury Stages: Stage 1   Pressure Injury Present Within 24 Hours of Hospital Admission: Yes   If This Pressure Injury Is Suspected to Be Device Related, Please Select the Device::    Is the Wound Open or Closed:    Wound Assessment Pink 10/12/23 0400   Peri-wound Assessment Intact 10/12/23 0400   Wound Drainage Amount None 10/12/23 0400   Wound Dressing Status None/open to air 10/12/23 0400   Number of days: 2       Wounds Surgical incision Medial;Upper Abdomen (Active)   10/10/23 1901   Wound Type: Surgical incision   Orientation: Medial;Upper   Location: Abdomen   Wound Location Comments: Port sites x 3.  Converted to open with midline inision.   Initial Wound Site Closure: Staples   Initial Dressing Placed: Negative pressure therapy (wound VAC dressing);Gauze;Transparent (i.e. Tegaderm)   Initial Cycle:    Initial Suction Setting (mmHg):    Pressure Injury Stages:    Pressure Injury Present Within 24 Hours of Hospital Admission:    If This Pressure Injury Is Suspected to Be Device Related, Please Select the Device::    Is the Wound Open or Closed:    Wound Assessment Dressing not removed for assessment 10/12/23 0800   Peri-wound Assessment Intact 10/12/23 0800   Wound Drainage Amount Small 10/12/23 0800   Wound Drainage Description Bloody 10/12/23 0800   Wound Dressing Status Intact 10/12/23 0800   Wound Care Dressing changed or new application 10/10/23 2130   Wound Dressing and/or Treatment Negative pressure therapy 10/12/23 0800   Cycle Continuous 10/12/23 0800   Suction Setting (mmHg) -125 mmHg 10/12/23 0800   Drain Output (mL) 0 ml 10/12/23 0800   Number of days: 2                                              ___________________________    Blake Ran, MD   Service Pager: # [phone]

## 2023-10-12 NOTE — Progress Notes
 Adult Mechanical Ventilator Liberation    Name: Blake Decker   MRN: [id number]     DOB: 10/12/1950      Age: 73 y.o.  Admission Date: 10/10/2023     LOS: 2 days     Date of Service: 10/12/2023        Adult Mechanical Ventilator Liberation: Twice daily     Weaning Readiness Screen Met (RT Only):: Yes  Initial Weaning Minute Volume (L/min): 8.4 L/min  Initial Weaning Respiratory Rate: 14 breaths/min  Initial Weaning Tidal Vol (mL)  (Calc.): 600 mL  Initial RSBI (Calculated): 23  Spontaneous Breathing Trial Completed (RT Only): Yes (Twice Daily)  Post Weaning Minute Volume (L/min): 9.76 L/min  Post Weaning Respiratory Rate: 15 breaths/min  Post Weaning Tidal Vol (mL)  (Calc.): 651 mL  NIF Ventilated: -35 cm H2O  $$ Vital Capacity (mL): 680 ml  Post RSBI (Calculated): 23  RSBI Ratio: 0  Spontaneous Breathing Trial Successful (Twice Daily): Yes  Patient Having Procedure Requiring Intubation in the Next 12 Hours?  (Twice Daily): Yes  Does Provider Want to Proceed with Extubation? (Twice Daily): No

## 2023-10-13 ENCOUNTER — Inpatient Hospital Stay: Admit: 2023-10-13 | Discharge: 2023-10-13 | Payer: MEDICARE

## 2023-10-13 ENCOUNTER — Encounter: Admit: 2023-10-13 | Discharge: 2023-10-13 | Payer: MEDICARE

## 2023-10-13 LAB — CBC
~~LOC~~ BKR HEMATOCRIT: 39 % — ABNORMAL LOW (ref 40.0–50.0)
~~LOC~~ BKR HEMOGLOBIN: 13 g/dL — ABNORMAL LOW (ref 13.5–16.5)
~~LOC~~ BKR MCH: 27 pg (ref 26.0–34.0)
~~LOC~~ BKR MCHC: 32 g/dL (ref 32.0–36.0)
~~LOC~~ BKR MCV: 84 fL (ref 80.0–100.0)
~~LOC~~ BKR MPV: 8 fL (ref 7.0–11.0)
~~LOC~~ BKR PLATELET COUNT: 367 10*3/uL (ref 150–400)
~~LOC~~ BKR RBC COUNT: 4.6 10*6/uL (ref 4.40–5.50)
~~LOC~~ BKR RBC COUNT: 4.5 10*6/uL — ABNORMAL LOW (ref 4.40–5.50)
~~LOC~~ BKR RDW: 14 % (ref 11.0–15.0)
~~LOC~~ BKR WBC COUNT: 11 10*3/uL — ABNORMAL HIGH (ref 4.50–11.00)
~~LOC~~ BKR WBC COUNT: 8.7 10*3/uL (ref 4.50–11.00)

## 2023-10-13 LAB — PHOSPHORUS: ~~LOC~~ BKR PHOSPHORUS: 4.5 mg/dL — ABNORMAL LOW (ref 2.0–4.5)

## 2023-10-13 LAB — SURGICAL PATHOLOGY

## 2023-10-13 LAB — TEG WITH KAOLIN
~~LOC~~ BKR ANGLE KAOLIN: 76 deg — ABNORMAL HIGH (ref 55.0–75.0)
~~LOC~~ BKR K KAOLIN: 1 min (ref 1.0–3.0)
~~LOC~~ BKR LYSIS30: 0.2 % (ref 0.0–8.0)
~~LOC~~ BKR MA KAOLIN: 80 mm — ABNORMAL HIGH (ref 50.0–70.0)
~~LOC~~ BKR R KAOLIN: 3.3 min — ABNORMAL LOW (ref 3.0–9.0)
~~LOC~~ BKR RK KAOLIN: 4.3 min (ref 4.0–12.0)

## 2023-10-13 LAB — MAGNESIUM: ~~LOC~~ BKR MAGNESIUM: 1.8 mg/dL — ABNORMAL LOW (ref 1.6–2.6)

## 2023-10-13 LAB — BLOOD GASES, ARTERIAL
~~LOC~~ BKR PCO2-ART: 51 mmHg — ABNORMAL HIGH (ref 35–45)
~~LOC~~ BKR PH-ART: 7.3 g/dL (ref 7.35–7.45)

## 2023-10-13 LAB — NOROVIRUS PCR, FECES

## 2023-10-13 LAB — LACTIC ACID(LACTATE): ~~LOC~~ BKR LACTIC ACID: 1 mmol/L — ABNORMAL HIGH (ref 0.5–2.0)

## 2023-10-13 LAB — COMPREHENSIVE METABOLIC PANEL
~~LOC~~ BKR ANION GAP: 10 (ref 3–12)
~~LOC~~ BKR GLOMERULAR FILTRATION RATE (GFR): >60 mL/min (ref >60)
~~LOC~~ BKR TOTAL PROTEIN: 6 g/dL — ABNORMAL HIGH (ref 6.0–8.0)

## 2023-10-13 LAB — PTT (APTT): ~~LOC~~ BKR PTT: 36 s (ref 24.0–36.5)

## 2023-10-13 LAB — IONIZED CALCIUM: ~~LOC~~ BKR IONIZED CALCIUM: 1.1 mmol/L (ref 1.00–1.30)

## 2023-10-13 MED ORDER — ADULT PN CONTINUOUS-ION BASED
INTRAVENOUS | 0 refills | Status: DC
Start: 2023-10-13 — End: 2023-10-13

## 2023-10-13 MED ORDER — ACETAMINOPHEN 1,000 MG/100 ML (10 MG/ML) IV SOLN
1000 mg | INTRAVENOUS | 0 refills | Status: DC
Start: 2023-10-13 — End: 2023-10-14
  Administered 2023-10-13 – 2023-10-14 (×4): 1000 mg via INTRAVENOUS

## 2023-10-13 MED ORDER — FENTANYL CITRATE (PF) 50 MCG/ML IJ SOLN
50 ug | Freq: Once | INTRAVENOUS | 0 refills | Status: CP
Start: 2023-10-13 — End: ?

## 2023-10-13 MED ORDER — FENTANYL CITRATE (PF) 50 MCG/ML IJ SOLN
25-50 ug | INTRAVENOUS | 0 refills | Status: DC | PRN
Start: 2023-10-13 — End: 2023-10-13
  Administered 2023-10-13 (×4): 50 ug via INTRAVENOUS

## 2023-10-13 MED ORDER — FENTANYL DRIP IN NS 1000MCG/100ML
10-50 ug/h | INTRAVENOUS | 0 refills | Status: DC
Start: 2023-10-13 — End: 2023-10-13

## 2023-10-13 MED ORDER — METHOCARBAMOL 100 MG/ML IJ SOLN
500 mg | INTRAVENOUS | 0 refills | Status: DC | PRN
Start: 2023-10-13 — End: 2023-10-14
  Administered 2023-10-13 – 2023-10-14 (×2): 500 mg via INTRAVENOUS

## 2023-10-13 MED ORDER — POTASSIUM CHLORIDE IN WATER 10 MEQ/50 ML IV PGBK
10 meq | INTRAVENOUS | 0 refills | Status: CP
Start: 2023-10-13 — End: ?
  Administered 2023-10-13: 13:00:00 10 meq via INTRAVENOUS

## 2023-10-13 MED ORDER — METHOCARBAMOL 100 MG/ML IJ SOLN
1000 mg | INTRAVENOUS | 0 refills | Status: DC | PRN
Start: 2023-10-13 — End: 2023-10-14
  Administered 2023-10-14: 09:00:00 1000 mg via INTRAVENOUS

## 2023-10-13 MED ORDER — FENTANYL PCA 500 MCG/50 ML SYR (STD CONC)(ADULT)(PREMADE)
INTRAVENOUS | 0 refills | Status: DC
Start: 2023-10-13 — End: 2023-10-14
  Administered 2023-10-14: 01:00:00 50.0000 mL via INTRAVENOUS

## 2023-10-13 MED ORDER — POTASSIUM CHLORIDE IN WATER 10 MEQ/50 ML IV PGBK
10 meq | INTRAVENOUS | 0 refills | Status: CP
Start: 2023-10-13 — End: ?
  Administered 2023-10-13: 12:00:00 10 meq via INTRAVENOUS

## 2023-10-13 MED ORDER — LABETALOL 5 MG/ML IV SOLN
10 mg | Freq: Once | INTRAVENOUS | 0 refills | Status: DC
Start: 2023-10-13 — End: 2023-10-14

## 2023-10-13 MED ORDER — HYDRALAZINE 20 MG/ML IJ SOLN
10 mg | INTRAVENOUS | 0 refills | Status: DC | PRN
Start: 2023-10-13 — End: 2023-10-14
  Administered 2023-10-14 (×2): 10 mg via INTRAVENOUS

## 2023-10-13 MED ORDER — HEPARIN (PORCINE) IN 5 % DEX 20,000 UNIT/500 ML (40 UNIT/ML) IV SOLP
0-2500 [IU]/h | INTRAVENOUS | 0 refills | Status: DC
Start: 2023-10-13 — End: 2023-10-16
  Administered 2023-10-13: 23:00:00 1800 [IU]/h via INTRAVENOUS
  Administered 2023-10-14: 08:00:00 1900 [IU]/h via INTRAVENOUS
  Administered 2023-10-14: 19:00:00 2100 [IU]/h via INTRAVENOUS
  Administered 2023-10-15: 15:00:00 2200 [IU]/h via INTRAVENOUS
  Administered 2023-10-15: 04:00:00 2100 [IU]/h via INTRAVENOUS
  Administered 2023-10-16 (×2): 2200 [IU]/h via INTRAVENOUS

## 2023-10-13 MED ORDER — HYDROMORPHONE PCA 10 MG/50 ML SYR (STD CONC)(ADULT)(PREMADE)
INTRAVENOUS | 0 refills | Status: DC
Start: 2023-10-13 — End: 2023-10-14

## 2023-10-13 MED ORDER — POTASSIUM CHLORIDE IN WATER 10 MEQ/50 ML IV PGBK
10 meq | INTRAVENOUS | 0 refills | Status: CP
Start: 2023-10-13 — End: ?
  Administered 2023-10-13: 22:00:00 10 meq via INTRAVENOUS

## 2023-10-13 MED ORDER — POTASSIUM CHLORIDE IN WATER 10 MEQ/50 ML IV PGBK
10 meq | INTRAVENOUS | 0 refills | Status: CP
Start: 2023-10-13 — End: ?
  Administered 2023-10-13: 21:00:00 10 meq via INTRAVENOUS

## 2023-10-13 MED ORDER — HYDROMORPHONE (PF) 2 MG/ML IJ SYRG
.5 mg | Freq: Once | INTRAVENOUS | 0 refills | Status: DC
Start: 2023-10-13 — End: 2023-10-14

## 2023-10-13 MED ORDER — LABETALOL 5 MG/ML IV SOLN
10 mg | Freq: Once | INTRAVENOUS | 0 refills | Status: CP
Start: 2023-10-13 — End: ?

## 2023-10-13 MED ORDER — ADULT PN CONTINUOUS-ION BASED
INTRAVENOUS | 0 refills | Status: CP
Start: 2023-10-13 — End: ?
  Administered 2023-10-14: 02:00:00 1320.0000 mL via INTRAVENOUS

## 2023-10-13 MED ORDER — MAGNESIUM SULFATE IN WATER 4 GRAM/50 ML (8 %) IV PGBK
4 g | Freq: Once | INTRAVENOUS | 0 refills | Status: CP
Start: 2023-10-13 — End: ?
  Administered 2023-10-13: 20:00:00 4 g via INTRAVENOUS

## 2023-10-13 MED ORDER — NALOXONE 0.4 MG/ML IJ SOLN
.08 mg | INTRAVENOUS | 0 refills | Status: DC | PRN
Start: 2023-10-13 — End: 2023-10-14

## 2023-10-13 MED ORDER — POTASSIUM CHLORIDE IN WATER 10 MEQ/50 ML IV PGBK
10 meq | INTRAVENOUS | 0 refills | Status: CP
Start: 2023-10-13 — End: ?
  Administered 2023-10-13: 20:00:00 10 meq via INTRAVENOUS

## 2023-10-13 MED ADMIN — LABETALOL 5 MG/ML IV SOLN [10372]: 10 mg | INTRAVENOUS | @ 19:00:00 | Stop: 2023-10-13 | NDC 36000032001

## 2023-10-13 NOTE — Progress Notes
 Adult Mechanical Ventilator Liberation    Name: Blake Decker   MRN: [id number]     DOB: [birth year]      Age: 73 y.o.  Admission Date: 10/10/2023     LOS: 3 days     Date of Service: 10/13/2023        Adult Mechanical Ventilator Liberation: Twice daily     Initial Weaning Minute Volume (L/min): 8.6 L/min  Initial Weaning Respiratory Rate: 18 breaths/min  Initial Weaning Tidal Vol (mL)  (Calc.): 478 mL  Initial RSBI (Calculated): 38  Post Weaning Minute Volume (L/min): 13 L/min  Post Weaning Respiratory Rate: 27 breaths/min  Post Weaning Tidal Vol (mL)  (Calc.): 481 mL  NIF Ventilated: -15 cm H2O  $$ Vital Capacity (mL): 600 ml  Post RSBI (Calculated): 56  RSBI Ratio: 47.37  Spontaneous Breathing Trial Successful (Twice Daily): No, NIF Ventilated > or equal to -20;No, RSBI Ratio > or equal to 20  Patient Having Procedure Requiring Intubation in the Next 12 Hours?  (Twice Daily): Yes  Does Provider Want to Proceed with Extubation? (Twice Daily): No      Trialing attempted this am on minimal MMV settings (+5, PS 5, 40%). Sedation turned off, although fent was maintained for pt comfort. Pt scheduled to return to OR this morning for abdominal closure.     RN at bedside to assess command following. Difficult to tell at first if pt was frustrated or was too lethargic to follow, but pt did eventually follow commands in lower extremities.     Pt seemed visibly uncomfortable during trial, perhaps explaining why NIF and VC were more difficult to obtain this morning. Best NIF and VC captured at -15 cmh20 and 600 ml respectively.     Trial cut off a few minutes early due to increased RR and increased agitation.     Pt remains on settings as ordered.

## 2023-10-13 NOTE — Unmapped
 Brief Operative Note    Name: Jakson Delpilar is a 73 y.o. male     DOB: 05-21-1951             MRN#: [id number]  DATE OF OPERATION: 10/13/2023    Date:  10/13/2023        Preoperative Dx:   Mesenteric ischemia (HCC) [K55.9]    Post-op Diagnosis      * Mesenteric ischemia (HCC) [K55.9]    Procedure(s):  REOPENING RECENT LAPAROTOMY  ENTERECTOMY RESECTION AND ANASTOMOSIS    Surgeons and Role:     * Ronn Melena, MD - Primary     * Garald Braver, DO - Resident - Assisting      Findings:  Bowel pink. ICG small and large bowel angiography with uniform perfusion. Primary anastomosis. Abdominal XR negative for retained instruments. Abdominal wall closure.      Estimated Blood Loss: <54mL    Specimen(s) Removed/Disposition:   ID Type Source Tests Collected by Time Destination   1 : Small Bowel Tissue Small Intestine SURGICAL PATHOLOGY Ronn Melena, MD 10/13/2023 1109        Complications:  None      Implants: * No implants in log *    Drains: Details on drains available on LDA report    Disposition:  ICU - stable    Lynnda Shields, DO  Pager [phone]

## 2023-10-13 NOTE — Progress Notes
 Surgical Critical Care Progress Note    Today's Date: 10/13/2023   Hospital Day: Hospital Day: 4    History of Present Illness:   Blake Decker is a 73 y.o. 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, SMA stenosis s/p ex lap, SBR, in discontinuity ,abthera placement (1/13) angio SMA, hepatic artery, celiac stent  (1/14)    33M known chronic mesenteric ischemia came to the ED on the 13th with increasing abdominal pain. CT obtained revealing severe mesenteric and celiac artery stenosis. EGS team was consulted and took him to the OR that same day. ICG small and large bowel angiography performed revealing necrosed mid-jejunum, approx 70 cm resected. Has since HDS off pressor with vascular planning to do angiogram w/ possible stenting today    Overnight events 1/14: Bowel visualized through the abthera. Coughing and the staples on the R side were dislodged from the outer sponge. 2 mg versed and 0.6mg  of rocuronium given and the sponges replaced.     Overnight events 1/15: Fent gtt was added for pain controlled. On .08 levo     Overnight events 1/16: nothing. Levo 0.07-0.08. To OR today for possible bowel anastomosis     Patient Active Problem List    Diagnosis Date Noted    S/P small bowel resection 10/12/2023    Moderate malnutrition (HCC) 10/11/2023    Abdominal pain, generalized 10/11/2023    Diarrhea 10/11/2023    Mesenteric ischemia (HCC) 10/11/2023    Atrial fibrillation (HCC) 10/11/2023    Acute blood loss anemia 10/11/2023    Severe sepsis with septic shock (HCC) 10/11/2023    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    Arm pain, left 10/04/2018    Acute respiratory failure with hypoxia (HCC) 09/25/2018    Severe sepsis (HCC) 09/25/2018    Olecranon bursitis of left elbow 09/25/2018    Cellulitis 09/23/2018    Atrial fibrillation with RVR (HCC) 09/23/2018    Coronary artery disease involving native coronary artery 09/23/2018    COPD (chronic obstructive pulmonary disease) (HCC) 09/23/2018    OSA (obstructive sleep apnea) 09/23/2018       Assessment & Plan:   Neuro   Acute pain due to trauma  Multimodal pain regimen:  -Tylenol IV 1000mg  q6h prn   -Fent gtt 50    Sedation  -Propofol     GCS 11 (E4,V1,M6)    CV   Undifferentiated shock versus hypovolemic versus septic, HTN, Afib (Xarelto), CAD s/p PCI w/ stent (2014), dilated cardiomyopathy, angio SMA, hepatic artery, celiac stent (1/14)  -Vital signs stable, continue to monitor. MAP > 65. Levo 0.08  -Vital signs per unit protocol  -PTA xarelto held. ASA/statin held. Metoprolol 7.5 mg q6h. Digoxin 100 mcg  -Continue hep gtt   -Mesenteric stens placed per vasc (1/14). Okay for hep gtt. Eventual PO ASA. No need for Plavix at discharge, ASA rectal given discontinuity   - Per vasc okay for xarelto and ASA at discharge     Pulm  Acute hypoxic respiratory failure, COPD, OSA, tobaccoism   -Mode: V/AC+  Set Vt (ml):  [500 milliliters]   Expired Tidal Volume Spont (mL):  [419 milliliters-542 milliliters]   Set RR:  [16 breaths/minutes]   Total Respiratory Rate (Breaths/Min):  [26 breaths/minutes-29 breaths/minutes]   Minute Volume (L/min):  [12.6 liters/minutes-16 liters/minutes]   %MVspon:  [1 %-100 %]   PIP Actual:  [11 cm H20-18 cm  H20]   PEEP/CPAP:  [5 cm H2O]   PSupport:  [5 cm H20]   Mean Airway Pressure:  [7 cm H2O-9 cm H2O]   TV 500, RR 16,  PEEP 5,   -ABG 7.44/41/77/27.1    GI/ Fluids / Electrolytes / Nutrition  Risk for constipation, Risk for electrolyte imbalance, chronic mesenteric ischemia, SMA stenosis s/p ex lap, SBR, in discontinuity ,abthera placement (1/13)  -Strict NPO, in discontinuity. Protonix 40 mg daily. OG 2665/24hrs  -Bowel regimen: senna, miralax (held)   -Abthera in place. Holding suction well   -To OR today for possible small bowel anastomosis   -Zofran prn nausea    GU    -Cr 1(1.44). UOP 1212  -Maintain foley Heme/ID  Acute blood loss anemia  - Hgb 13(13.1). No clinical signs of overt bleeding. Continue to trend serially. Optimize fluid balance. Monitor stools for signs of occult GI bleeding.    Leukocytosis  -WBC 11(13). Afebrile   -Cultures 1/13   Urine, no growth. Fecal rotavirus, cryptosporidium, cdiff negative     Endo  -No acute issues. Monitor and treat prn. Glucose 150    MS  Impaired mobility and activities of daily living  -PT/OT    Daily Quality Checklist  Lines:  3 Peripheral IV   R IJ central line   Foley Cath  Art line  OG  Activity progressive mobility   GI Prophylaxis: protonix  DVT Prophylaxis: held   Nutrition: strict NPO, in discontinuity   Bowel Regimen: held  Lab Frequency: Daily   Daily CXR: No  Disposition: SICU    Subjective    Resting comfortably. Intubated     Objective:    Physical Exam:  General: intubated, sedated   HEENT: Normocephalic, atraumatic; mucus membranes moist  Lungs:  intubated   Heart: regular rate   Abdomen:  Soft, abthera in place to suction   Extremities:  No edema; Warm, without cyanosis    Labs:    Complete Blood Counts   Recent Labs     10/11/23  1112 10/11/23  1512 10/12/23  0317 10/13/23  0309   HGB 13.2* 13.3* 13.1* 13.0*   HCT 38.8* 41.2 40.4 39.4*   WBC 17.30* 16.70* 13.00* 11.30*   PLTCT 293 324 356 367   MCV 84.2 84.7 84.2 84.8   MCH 28.6 27.3 27.3 27.9   MCHC 34.0 32.2 32.4 32.9   RDW 14.6 14.4 14.6 14.7   MPV 8.6 8.5 8.2 8.0     No results for input(s): NEUT, LYMPH, MONO, POIK, OVAL, PLTEST in the last 72 hours.    Invalid input(s): EOSINOPHIL       Chemistry Panel   Recent Labs     10/11/23  0331 10/11/23  1112 10/11/23  1421 10/11/23  1846 10/12/23  0317 10/13/23  0309   NA 132* 129* 132* - 132* 131* 131* 132*   K 4.0 3.9 3.7  --  3.5 3.7   CL 94* 93* 93*  --  94* 94*   CO2 27 26 24   --  27 27   BUN 19 22 23   --  25 24   CR 1.14 1.48* 1.36*  --  1.44* 1.00   GLU 145* 113* 153*  --  154* 150*   GAP 11 10 15*  --  10 11   GFR >60 50* 56*  --  52* >60   MG 2.4 2.0  --   --  2.1 1.9   CA 8.6 8.5 9.0  --  8.5 8.8   PO4 4.1 5.0*  --   --  4.6* 3.5     Recent Labs     10/10/23  2042 10/11/23  1112   ALKPHOS 97 92   AST 12 18   ALT 10 12   TOTPROT 6.2 5.9*   TOTBILI 1.3 1.7*   ALBUMIN 3.2* 3.0*        Coagulation Studies   Recent Labs     10/11/23  1112 10/11/23  1955 10/12/23  0153 10/13/23  0309   PTT 87.3* 59.0* 62.0* 36.1   INR 1.8*  --   --   --           Vital Signs (24 hours)  ABP: (98-155)/(41-77)   Temp:  [36.2 ?C (97.2 ?F)-38.5 ?C (101.3 ?F)]   Pulse:  [73-110]   Respirations:  [12 PER MINUTE-28 PER MINUTE]   SpO2:  [90 %-99 %]   O2%:  [40 %]   O2 Device: Ventilator    Medications  Scheduled Meds:[Held by Provider] amLODIPine (NORVASC) tablet 5 mg, 5 mg, Oral, QDAY  [Held by Provider] aspirin EC (ASPIR-LOW) tablet 81 mg, 81 mg, Oral, QDAY  aspirin rectal suppository 300 mg, 300 mg, Rectal, QDAY  [Held by Provider] atorvastatin (LIPITOR) tablet 40 mg, 40 mg, Oral, QDAY  [Held by Provider] busPIRone (BUSPAR) tablet 15 mg, 15 mg, Oral, TID  digoxin (LANOXIN) injection 100 mcg, 100 mcg, Intravenous, QDAY  [Held by Provider] lisinopriL (ZESTRIL) tablet 20 mg, 20 mg, Oral, BID  [Held by Provider] metoprolol (LOPRESSOR) injection 7.5 mg, 7.5 mg, Intravenous, Q6H*  pantoprazole (PROTONIX) injection 40 mg, 40 mg, Intravenous, QDAY  potassium chloride in water IVPB 10 mEq, 10 mEq, Intravenous, Q30 MIN   Followed by  potassium chloride in water IVPB 10 mEq, 10 mEq, Intravenous, Q30 MIN   Followed by  potassium chloride in water IVPB 10 mEq, 10 mEq, Intravenous, Q30 MIN  [Held by Provider] pregabalin (LYRICA) capsule 200 mg, 200 mg, Oral, TID  [Held by Provider] rOPINIRole (REQUIP) tablet 1 mg, 1 mg, Oral, QHS  [Held by Provider] venlafaxine XR (EFFEXOR XR) capsule 150 mg, 150 mg, Oral, QDAY    Continuous Infusions:   fentaNYL (SUBLIMAZE) 1000 mcg/100 mL NS IV drip (std conc)(premade) 50 mcg/hr (10/12/23 1639)    heparin (porcine) 20,000 units/D5W 500 mL infusion (std conc)(premade) 1,800 Units/hr (10/13/23 0425)    norepinephrine (LEVOPHED) 4 mg in dextrose 5% (D5W) 250 mL IV drip (std conc) 0.08 mcg/kg/min (10/13/23 0256)    propofoL (DIPRIVAN) 10 mg/mL IV drip 40 mcg/kg/min (10/13/23 0418)     PRN and Respiratory Meds:acetaminophen (OFIRMEV) IV Q6H PRN, albuterol-ipratropium Q4H PRN, nalOXone PRN, ondansetron Q6H PRN **OR** ondansetron (ZOFRAN) IV Q6H PRN, [Held by Provider] polyethylene glycol 3350 QDAY PRN, [Held by Provider] sennosides-docusate sodium QDAY PRN      I have personally reviewed pertinent labs, medications, radiology, and diagnostic procedures including: active problem list, medication list, allergies, family history, social history, health maintenance, notes from last encounter, lab results, imaging.     Jonette Eva, MD  Electronically Signed  10/13/2023 6:23 AM  Available on [employer]

## 2023-10-13 NOTE — Consults
 Anesthesia Consult Note  10/13/2023     Patient: Blake Decker  MRN: [id number]    Admission Date:  10/10/2023, LOS: 3 days  Admission Diagnosis: Superior mesenteric artery stenosis (HCC) [K55.1]    ASSESSMENT: 73 y.o. male with with a PMH chronic mesenteric ischemia s/p celiac stent and abthera placement, Atrial fibrillation, CAD, COPD, Dilated cardiomyopathy, Former tobacco use, Hypertension, Obesity, OSA, PVD with complaints of incisional site pain after abdominal surgery for his chronic mesenteric ischemia. APS was consulted for assistance with management of his incisional site pain.     I discussed multimodal pain strategies with Blake Decker including IV analgesics and nerve blocks (ESBP vs TAPs). Unfortunately, Blake Decker is not a candidate for a thoracic epidural at this time, as he is currently on a heparin infusion, and this will need to be stopped 4-6 hours prior to any neuraxial procedures. After explaining the risks and benefits of each procedure with Blake Decker, we shall proceed with bilateral TAPs blocks to help manage his pain. Recommend optimizing his IV pain medications as outlined below.       PLAN:  - Performed bilateral TAPs block under ultrasound guidance  - Recommend Multimodal pain control as follows:  - Can increase PCA dose to 15 mcg q8min with ETCO2 monitoring  - Can schedule IV Robaxin 500 mg tid  - Agree with scheduled Tylenol  - Rest of care per primary team    Discussed with Dr Monica Martinez.    Hulen Luster, MBChB  Pager [phone]  __________________________________________________________________________________    HPI: Blake Decker is a 73 y.o. male with a PMH chronic mesenteric ischemia s/p celiac stent and abthera placement, Atrial fibrillation, CAD, COPD, Dilated cardiomyopathy, Former tobacco use, Hypertension, Obesity, OSA, PVD with complaints of incisional site pain after abdominal surgery for his chronic mesenteric ischemia.     He notes a 10/10, sharp crampy, constant both sides of his abdominal incision. It does not radiate. He is on a PCA fentanyl at q68min, IV Tylenol 1g 8 hourly and IV Robaxin 500 mg q8hrly PRN which has not controlled his pain.     The surgery team reached out to APS for assistance in managing his pain. Of note he has chronic Afib and is currently on a heparin infusion.     Past Medical History:    Abnormal heart rhythms    Atrial fibrillation (HCC)    CAD (coronary artery disease)    COPD (chronic obstructive pulmonary disease) (HCC)    Dilated cardiomyopathy (HCC)    Former tobacco use    Heart disease    Hypertension    Lung disease    Obesity, Class II, BMI 35-39.9    OSA (obstructive sleep apnea)    PVD (peripheral vascular disease) (HCC)    Tobacco abuse     Surgical History:   Procedure Laterality Date    HX CORONARY STENT PLACEMENT  2014    DEBRIDEMENT WOUND DEEP 20 SQ CM OR LESS -UPPER EXTREMITY Left 09/24/2018    Performed by Dorthula Matas, MD at [facility] OR    DEBRIDEMENT WOUND MEDIUM 20 SQ CM OR LESS - UPPER EXTREMITY Left 09/26/2018    Performed by Azzie Glatter, MD at [facility] OR    NEGATIVE PRESSURE WOUND THERAPY FOR WOUND AREA 50 SQ CM OR LESS Left 09/26/2018    Performed by Azzie Glatter, MD at [facility] OR    DEBRIDEMENT WOUND DEEP 20 SQ CM OR LESS -UPPER EXTREMITY, SECONDARY WOUND  CLOSURE Left 09/29/2018    Performed by Azzie Glatter, MD at [facility] OR    DIAGNOSTIC LAPAROSCOPY, LAPAROSCOPIC ENTERECTOMY CONVERTED TO LAPAROTOMY N/A 10/10/2023    Performed by Trenda Moots, MD at [facility] OR     Medications:  No current facility-administered medications on file prior to encounter.     Current Outpatient Medications on File Prior to Encounter   Medication Sig Dispense Refill    amLODIPine (NORVASC) 5 mg tablet Take one tablet by mouth daily.      aspirin EC 81 mg tablet Take one tablet by mouth daily. Take with food.      atorvastatin (LIPITOR) 40 mg tablet Take one tablet by mouth daily.      busPIRone (BUSPAR) 15 mg tablet Take one tablet by mouth three times daily.      digoxin (LANOXIN) 125 mcg (0.125 mg) tablet Take one tablet by mouth daily.      furosemide (LASIX) 40 mg tablet Take one tablet by mouth every morning.      lisinopril (PRINIVIL; ZESTRIL) 20 mg tablet Take one tablet by mouth twice daily.      metoprolol tartrate (LOPRESSOR) 50 mg tablet Take one tablet by mouth twice daily. 60 tablet 3    MULTIVITAMIN PO Take 1 tablet by mouth daily.      nicotine (NICODERM CQ) 21 mg/day patch Apply one patch to top of skin as directed daily.      nitroglycerin (NITROSTAT) 0.4 mg tablet Place one tablet under tongue every 5 minutes as needed for Chest Pain. Max of 3 tablets, call 911.      oxyCODONE 10 mg tablet Take one tablet by mouth three times daily as needed for Pain.      pantoprazole DR (PROTONIX) 40 mg tablet Take one tablet by mouth daily before breakfast.      potassium chloride (K-DUR) 10 mEq tablet Take one tablet by mouth daily.      pregabalin (LYRICA) 200 mg capsule Take one capsule by mouth three times daily.      rivaroxaban (XARELTO) 20 mg tablet Take one tablet by mouth daily. Take with food.      rOPINIRole (REQUIP) 1 mg tablet Take one tablet by mouth at bedtime daily.      tadalafiL (CIALIS) 20 mg tablet Take one tablet by mouth as Needed.      tamsulosin (FLOMAX) 0.4 mg capsule Take one capsule by mouth daily. Do not crush, chew or open capsules. Take 30 minutes following the same meal each day.      venlafaxine XR (EFFEXOR XR) 150 mg capsule Take one capsule by mouth daily. Take with food.       Allergies:  Dilaudid [hydromorphone] and Hydrochlorothiazide    Social History     Socioeconomic History    Marital status: Married   Tobacco Use    Smoking status: Former     Current packs/day: 0.00     Average packs/day: 1 pack/day for 34.0 years (34.0 ttl pk-yrs)     Types: Cigarettes     Start date: 06/09/1984     Quit date: 06/09/2018     Years since quitting: 5.3    Smokeless tobacco: Never   Vaping Use    Vaping status: Never Used Substance and Sexual Activity    Alcohol use: Not Currently    Drug use: Never     No family history on file.  Vitals:  Vital Signs: Last Filed In 24 Hours Vital Signs: 24  Hour Range   BP: 152/76 (01/16 2204)  Temp: 36.7 ?C (98.1 ?F) (01/16 2010)  Pulse: 98 (01/16 2204)  Respirations: 19 PER MINUTE (01/16 2204)  SpO2: 92 % (01/16 2204)  O2%: 40 % (01/16 1500)  O2 Device: High flow nasal cannula (01/16 2200)  O2 Liter Flow: 4 Lpm (01/16 2200) BP: (152-191)/(67-96)   ABP: (87-198)/(44-115)   Temp:  [36.2 ?C (97.2 ?F)-38.5 ?C (101.3 ?F)]   Pulse:  [73-110]   Respirations:  [10 PER MINUTE-28 PER MINUTE]   SpO2:  [90 %-97 %]   O2%:  [40 %]   O2 Device: High flow nasal cannula  O2 Liter Flow: 4 Lpm   Intensity Pain Scale (Self Report): 8 (10/13/23 2000)        Physical Exam:   GEN:  A&Ox4, NAD  HEENT:  EOMI, no scleral icterus  RESP:  Unlabored respirations  CV:  Regular rate  ABD: Tenderness B/L abdominal incision site  EXT:  Moving all extremities   SKIN: No jaundice or pallor  NEURO: Strength and sensation grossly in-tact bilaterally    ROS:  A complete 12 point ROS was obtained and was negative except for those listed in HPI.    Lab/Radiology/Other Diagnostic Tests:  Recent Labs     10/11/23  0331 10/11/23  1112 10/11/23  1421 10/11/23  1512 10/11/23  1846 10/11/23  1955 10/12/23  0153 10/12/23  0317 10/13/23  0309 10/13/23  1224 10/13/23  2039   HGB 13.3* 13.2*  --  13.3*  --   --   --  13.1* 13.0* 12.9*  --    HCT 40.0 38.8*  --  41.2  --   --   --  40.4 39.4* 37.9*  --    WBC 18.00* 17.30*  --  16.70*  --   --   --  13.00* 11.30* 8.70  --    PLTCT 318 293  --  324  --   --   --  356 367 300  --    NA 132* 129* 132* - 132*  --  131*  --   --  131* 132* 133*  --    K 4.0 3.9 3.7  --   --   --   --  3.5 3.7 3.7  --    CL 94* 93* 93*  --   --   --   --  94* 94* 95*  --    CO2 27 26 24   --   --   --   --  27 27 28   --    BUN 19 22 23   --   --   --   --  25 24 23   --    CR 1.14 1.48* 1.36*  --   --   --   --  1.44* 1.00 0.97  --    GLU 145* 113* 153*  --   --   --   --  154* 150* 122*  --    CA 8.6 8.5 9.0  --   --   --   --  8.5 8.8 8.6  --    MG 2.4 2.0  --   --   --   --   --  2.1 1.9 1.8  --    PO4 4.1 5.0*  --   --   --   --   --  4.6* 3.5 4.5  --    ALBUMIN  --  3.0*  --   --   --   --   --   --   --  3.0*  --    TOTPROT  --  5.9*  --   --   --   --   --   --   --  6.0  --    TOTBILI  --  1.7*  --   --   --   --   --   --   --  0.9  --    AST  --  18  --   --   --   --   --   --   --  7  --    ALT  --  12  --   --   --   --   --   --   --  6*  --    ALKPHOS  --  92  --   --   --   --   --   --   --  82  --    INR  --  1.8*  --   --   --   --   --   --   --  1.3*  --    PT  --  19.1*  --   --   --   --   --   --   --  13.8*  --    PTT 66.3* 87.3*  --   --   --  59.0* 62.0*  --  36.1 27.7 38.2*     Glucose: (!) 122 (10/13/23 1224)

## 2023-10-13 NOTE — Consults
 Nutrition Support Service (NSS) Initial Consult Note    Name: Blake Decker   MRN: [id number]     DOB: [birth year]      Age: 73 y.o.  Admission Date: 10/10/2023     LOS: 3 days     Date of Service: 10/13/2023      Service / Attending: Surgery- Emergency General [id number] / Trenda Moots, MD    Pertinent Diagnosis: SMA stenosis  PN Indication: NPO for 7 days or more (to date or expected)   PN Line: Right IJ CVC  Estimated PN therapy end date: unknown, when EN vs PO feasible and meeting at least 65% estimated needs consistently    Diet Order: NPO    Pertinent allergies/intolerances: no known    PMH:   Past Medical History:    Abnormal heart rhythms    Atrial fibrillation (HCC)    CAD (coronary artery disease)    COPD (chronic obstructive pulmonary disease) (HCC)    Dilated cardiomyopathy (HCC)    Former tobacco use    Heart disease    Hypertension    Lung disease    Obesity, Class II, BMI 35-39.9    OSA (obstructive sleep apnea)    PVD (peripheral vascular disease) (HCC)    Tobacco abuse       PSH:   Surgical History:   Procedure Laterality Date    HX CORONARY STENT PLACEMENT  2014    DEBRIDEMENT WOUND DEEP 20 SQ CM OR LESS -UPPER EXTREMITY Left 09/24/2018    Performed by Dorthula Matas, MD at [facility] OR    DEBRIDEMENT WOUND MEDIUM 20 SQ CM OR LESS - UPPER EXTREMITY Left 09/26/2018    Performed by Azzie Glatter, MD at [facility] OR    NEGATIVE PRESSURE WOUND THERAPY FOR WOUND AREA 50 SQ CM OR LESS Left 09/26/2018    Performed by Azzie Glatter, MD at [facility] OR    DEBRIDEMENT WOUND DEEP 20 SQ CM OR LESS -UPPER EXTREMITY, SECONDARY WOUND CLOSURE Left 09/29/2018    Performed by Azzie Glatter, MD at [facility] OR    DIAGNOSTIC LAPAROSCOPY, LAPAROSCOPIC ENTERECTOMY CONVERTED TO LAPAROTOMY N/A 10/10/2023    Performed by Trenda Moots, MD at [facility] OR       Meds: Scheduled Meds:acetaminophen (OFIRMEV) 1,000 mg injection 100 mL, 1,000 mg, Intravenous, Q6H*  [Held by Provider] amLODIPine (NORVASC) tablet 5 mg, 5 mg, Oral, QDAY  [Held by Provider] aspirin EC (ASPIR-LOW) tablet 81 mg, 81 mg, Oral, QDAY  aspirin rectal suppository 300 mg, 300 mg, Rectal, QDAY  [Held by Provider] atorvastatin (LIPITOR) tablet 40 mg, 40 mg, Oral, QDAY  [Held by Provider] busPIRone (BUSPAR) tablet 15 mg, 15 mg, Oral, TID  digoxin (LANOXIN) injection 100 mcg, 100 mcg, Intravenous, QDAY  [Held by Provider] lisinopriL (ZESTRIL) tablet 20 mg, 20 mg, Oral, BID  magnesium sulfate   4 g/50 mL IVPB, 4 g, Intravenous, ONCE  [Held by Provider] metoprolol (LOPRESSOR) injection 7.5 mg, 7.5 mg, Intravenous, Q6H*  pantoprazole (PROTONIX) injection 40 mg, 40 mg, Intravenous, QDAY  potassium chloride in water IVPB 10 mEq, 10 mEq, Intravenous, Q30 MIN   Followed by  potassium chloride in water IVPB 10 mEq, 10 mEq, Intravenous, Q30 MIN   Followed by  potassium chloride in water IVPB 10 mEq, 10 mEq, Intravenous, Q30 MIN  [Held by Provider] pregabalin (LYRICA) capsule 200 mg, 200 mg, Oral, TID  [Held by Provider] rOPINIRole (REQUIP) tablet 1 mg, 1 mg, Oral, QHS  [Held by Provider]  venlafaxine XR (EFFEXOR XR) capsule 150 mg, 150 mg, Oral, QDAY    Continuous Infusions:   Adult Continuous Parenteral Nutrition (PN)      heparin (porcine) 20,000 units/D5W 500 mL infusion (std conc)(premade)      propofoL (DIPRIVAN) 10 mg/mL IV drip 30 mcg/kg/min (10/13/23 1321)     PRN and Respiratory Meds:albuterol-ipratropium Q4H PRN, fentaNYL citrate PF Q1H PRN, methocarbamoL Q8H PRN, nalOXone PRN, ondansetron Q6H PRN **OR** ondansetron (ZOFRAN) IV Q6H PRN, [Held by Provider] polyethylene glycol 3350 QDAY PRN, [Held by Provider] sennosides-docusate sodium QDAY PRN    Electrolyte Treatments: 50 mEq IV KCl 1/15; 30 mEq IV KCl  and 4 g IV magnesium so far 1/16     Pertinent Labs:   Comprehensive Metabolic Profile    Lab Results   Component Value Date/Time    NA 133 (L) 10/13/2023 12:24 PM    K 3.7 10/13/2023 12:24 PM    CL 95 (L) 10/13/2023 12:24 PM    CO2 28 10/13/2023 12:24 PM    GAP 10 10/13/2023 12:24 PM BUN 23 10/13/2023 12:24 PM    CR 0.97 10/13/2023 12:24 PM    GLU 122 (H) 10/13/2023 12:24 PM    Lab Results   Component Value Date/Time    CA 8.6 10/13/2023 12:24 PM    PO4 4.5 10/13/2023 12:24 PM    ALBUMIN 3.0 (L) 10/13/2023 12:24 PM    TOTPROT 6.0 10/13/2023 12:24 PM    ALKPHOS 82 10/13/2023 12:24 PM    AST 7 10/13/2023 12:24 PM    ALT 6 (L) 10/13/2023 12:24 PM    TOTBILI 0.9 10/13/2023 12:24 PM    GFR >60 10/13/2023 12:24 PM    GFRAA >60 05/28/2019 06:06 PM        Lab Results   Component Value Date    MG 1.8 10/13/2023     Lab Results   Component Value Date    TRIG 70 09/23/2018     No results found for: GLUPOC    Height (cm): Height: 182.9 cm (6' 0.01)  Admit Weight (kg): Weight: 117.9 kg (260 lb)  Current Weight (kg): Weight: 109.7 kg (241 lb 13.5 oz)  Desired Body Weight (kg): 83.3 kg (BMI 24.9); IBW per hamwai = 80.9kg   Body mass index is 32.79 kg/m?Marland Kitchen     Estimated Calorie Needs: 1600-2000 (80-100% modified SU, consistent with ~19-25 kcals/kg DBW 83.3kg)  Estimated Protein Needs: 120-160 (1.5-2 g/kg IBW 80.9kg)    Malnutrition Assessment:  Malnutrition Details:  Malnutrition present on admission  ICD-10 code E44: Acute illness/Moderate non-severe malnutrition  Energy Intake: Less than 75% of estimated energy requirement for greater than 7days, Weight loss: 1-2% x 1 week            Loss of Subcutaneous Fat: No      Muscle Wasting: No                   Malnutrition Interventions:   Pressure Injuries: none noted    Assessment / PN Indication: Patient is a 73 y.o. male with a PMH that includes HTN, Afib, CAD s/p PCI w/ stent (2014), dilated cardiomyopathy, PVD, COPD, OSA, tobaccoism who presented with increasing abdominal pain. CT obtained revealing severe mesenteric and celiac artery stenosis and patient was taken to OR 1/13 and is s/p ex lap, SBR (approximately 70 cm of necrotic jejunum noted to be resected with perforation), in discontinuity with abthera placement.  With worsening abdominal pain over the past 2 weeks, pt with  poor PO intake noted and was with 10 daily loose stools on average. Care Everywhere shows weight of 255 lbs 08/19/23 and 250 lbs 09/30/23, and now current bed wt 10/10/23 at 241 lbs (~4.6% loss over past 2 weeks).     1/16: Pt returned to OR 1/16 for reopening recent laparotomy, enterectomy resection and anastomosis. Pt remains intubated and sedated on propofol, current rate 21.2 ml/hr (~560kcals/24hrs). On levophed.  Pt is NPO going on day 4 this admission and with worsening PO intake, diarrhea, abd pain for 2 weeks PTA with weight loss and meeting malnutrition criteria, along with 70 cm jejunum resected, planning to initiate TPN.  Will leave lipids out of PN to start with high propofol infusion.  Will start with lower dextrose and supplemental thiamine for refeeding risk. Starting with lower end AA as max allowable with PN volume ordered to keep concentration within range.     PN Order (to start at 20:30):  Nonstandard TPN: 55 ml/hr x 24hr (1320 ml total volume)    Macronutrients: AA  130g (goal= 120-160g), Dex 150 g (goal=155-165 g), SMOFlipids 0 ml (goal=315 ml)    Lytes: Na 171 mEq, K 40 mEq, Ca 10 mEq, Mg 16 mEq, Phos 14 mmol, 3:1 WG:[id number]  Additives: 10 ml multivitamin, 1ml trace elements, and 100mg  thiamine    PN to provide 1030 kcals* (12 kcals/kg DBW 83.3kg; ~57% estimated needs), 130 g AA (1.55 g/kg IBW 80.9kg, 100%  lower end estimated needs)  *additional kcals from propofol    Willette Cluster, MA, RD, LD, CNSC  Available on [place]

## 2023-10-13 NOTE — Progress Notes
 Daily Progress Note      Date of Service:  10/13/2023  Name:  Blake Decker                       MRN:  [id number]   Admission Date: 10/10/2023 (LOS: 3 days)                     Assessment: Blake Decker is a 73 y.o. 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 Continues to have pedal signals on exam  - Pt returned to the OR today with SICU team. Bowel was well perfused.  - On Xarelto at baseline due to Afib, can transition back at any point  - Will need ASA for stents. No need for plavix as he is chronically anticoagulated  - Please page [phone] with questions, concerns, or changes in clinical status.       Discussed with Dr. Phil Dopp  _____________________________________________________________________________    Subjective:  No acute events overnight. Returned to the OR today.     Objective:  BP: (180)/(92)   ABP: (87-198)/(44-115)   Temp:  [36.2 ?C (97.2 ?F)-38.5 ?C (101.3 ?F)]   Pulse:  [73-110]   Respirations:  [12 PER MINUTE-28 PER MINUTE]   SpO2:  [90 %-97 %]   O2%:  [40 %]   O2 Device: High flow nasal cannula  O2 Liter Flow: 3 Lpm  Body mass index is 32.79 kg/m?Marland Kitchen    Lab Results   Component Value Date/Time    HGB 12.9 (L) 10/13/2023 12:24 PM    HCT 37.9 (L) 10/13/2023 12:24 PM    WBC 8.70 10/13/2023 12:24 PM    PLTCT 300 10/13/2023 12:24 PM    INR 1.3 (H) 10/13/2023 12:24 PM     Lab Results   Component Value Date/Time    NA 133 (L) 10/13/2023 12:24 PM    K 3.7 10/13/2023 12:24 PM    CL 95 (L) 10/13/2023 12:24 PM    CO2 28 10/13/2023 12:24 PM    BUN 23 10/13/2023 12:24 PM    CR 0.97 10/13/2023 12:24 PM    MG 1.8 10/13/2023 12:24 PM    PO4 4.5 10/13/2023 12:24 PM    CA 8.6 10/13/2023 12:24 PM     No results found for: GLUPOC       Physical Exam  General: extubated, on NC  Head: Normocephalic, without obvious abnormality, atraumatic. NGT in place with dark output  Eyes: PERRL, EOMI, no scleral icterus  Neck: Supple, symmetrical, trachea midline  Lungs: Mechanically ventilated. Symmetric chest rise. Synchronous on vent.   Heart: Regular rate. Normotensive. Palpable radial pulses. Pedal signals on exam  Abdomen: Soft, distended.   Skin: Skin color, texture, turgor normal. No rashes or lesions.   Extremity: No clubbing, cyanosis, or edema  Neurologic: Sedated  ICD-10 code E44: Acute illness/Moderate non-severe malnutrition  Energy Intake: Less than 75% of estimated energy requirement for greater than 7days, Weight loss: 1-2% x 1 week        Loss of Subcutaneous Fat: No      Muscle Wasting: No             Malnutrition Interventions: Nutrition assessment, avoid prolonged NPO status, monitor nutrition plans.    Active Wounds          Wounds Surgical incision Medial Abdomen (Active)   10/13/23 1144   Wound Type: Surgical incision   Orientation: Medial   Location: Abdomen   Wound Location Comments:    Initial Wound Site Closure: Sutures;Staples   Initial Dressing Placed: Gauze;Hypafix tape   Initial Cycle:    Initial Suction Setting (mmHg):    Pressure Injury Stages:    Pressure Injury Present Within 24 Hours of Hospital Admission:    If This Pressure Injury Is Suspected to Be Device Related, Please Select the Device::    Is the Wound Open or Closed:    Wound Assessment Dressing not removed for assessment 10/13/23 1221   Peri-wound Assessment Dry;Intact 10/13/23 1221   Wound Drainage Amount None 10/13/23 1221   Wound Dressing Status Intact 10/13/23 1221   Number of days: 0 ___________________________    Scherry Ran, MD   Service Pager: # [phone]

## 2023-10-13 NOTE — Progress Notes
 RT Adult Assessment Note    NAME:Blake Decker             MRN: [id number]             DOB:Aug 06, 1951          AGE: 73 y.o.  ADMISSION DATE: 10/10/2023             DAYS ADMITTED: LOS: 3 days    Additional Comments:  Impressions of the patient: pt in bed confused - able to do some therapies; NAD; family was able to provide answers  Intervention(s)/outcome(s): lung expansion; albuterol PRN; cough techniques  Patient education that was completed: PEP/PAP  Recommendations to the care team: none    Vital Signs:  Pulse: 84  RR: 10 PER MINUTE  SpO2: 93 %  O2 Device: High flow nasal cannula  Liter Flow: 4 Lpm  O2%:      Breath Sounds:   All Breath Sounds: Coarse crackles  Respiratory Effort:   Respiratory Effort/Pattern: Unlabored  Comments:

## 2023-10-14 ENCOUNTER — Encounter: Admit: 2023-10-14 | Discharge: 2023-10-14 | Payer: MEDICARE

## 2023-10-14 DIAGNOSIS — K551 Chronic vascular disorders of intestine: Secondary | ICD-10-CM

## 2023-10-14 DIAGNOSIS — Z959 Presence of cardiac and vascular implant and graft, unspecified: Secondary | ICD-10-CM

## 2023-10-14 LAB — BASIC METABOLIC PANEL
~~LOC~~ BKR ANION GAP: 11 % — ABNORMAL HIGH (ref 3–12)
~~LOC~~ BKR CALCIUM: 9.3 mg/dL (ref 8.5–10.6)
~~LOC~~ BKR CO2: 31 mmol/L — ABNORMAL HIGH (ref 21–30)
~~LOC~~ BKR GLOMERULAR FILTRATION RATE (GFR): >60 mL/min (ref >60)
~~LOC~~ BKR GLOMERULAR FILTRATION RATE (GFR): >60 mL/min (ref >60–400)
~~LOC~~ BKR POTASSIUM: 4 mmol/L (ref 3.5–5.1)

## 2023-10-14 LAB — ECG 12-LEAD
Q-T INTERVAL: 366 ms
QRS DURATION: 120 ms
QTC CALCULATION (BAZETT): 459 ms
R AXIS: 0 degrees
T AXIS: 143 degrees
VENTRICULAR RATE: 95 {beats}/min

## 2023-10-14 LAB — CBC
~~LOC~~ BKR HEMATOCRIT: 40 % — ABNORMAL LOW (ref 40.0–50.0)
~~LOC~~ BKR MCH: 27 pg (ref 26.0–34.0)
~~LOC~~ BKR MCHC: 33 g/dL (ref 32.0–36.0)
~~LOC~~ BKR MCV: 84 fL — ABNORMAL HIGH (ref 80.0–100.0)
~~LOC~~ BKR MPV: 7.7 fL (ref 7.0–11.0)
~~LOC~~ BKR PLATELET COUNT: 354 10*3/uL (ref 150–400)
~~LOC~~ BKR RBC COUNT: 4.8 10*6/uL — ABNORMAL LOW (ref 4.40–5.50)
~~LOC~~ BKR RDW: 14 % (ref 11.0–15.0)
~~LOC~~ BKR WBC COUNT: 13 10*3/uL — ABNORMAL HIGH (ref 4.50–11.00)

## 2023-10-14 LAB — HIGH SENSITIVITY TROPONIN I, RANDOM: ~~LOC~~ BKR HIGH SENSITIVITY TROPONIN I: 17 ng/L (ref <20)

## 2023-10-14 LAB — PTT (APTT)
~~LOC~~ BKR PTT: 38 s — ABNORMAL HIGH (ref 24.0–36.5)
~~LOC~~ BKR PTT: 46 s — ABNORMAL HIGH (ref 24.0–36.5)

## 2023-10-14 LAB — SURGICAL PATHOLOGY

## 2023-10-14 LAB — CULTURE-FECES W/SENSITIVITY

## 2023-10-14 LAB — LACTIC ACID(LACTATE): ~~LOC~~ BKR LACTIC ACID: 2 mmol/L (ref 0.5–2.0)

## 2023-10-14 MED ORDER — DEXMEDETOMIDINE IN 0.9 % NACL 400 MCG/100 ML (4 MCG/ML) IV SOLN
.2-1 ug/kg/h | INTRAVENOUS | 0 refills | Status: DC
Start: 2023-10-14 — End: 2023-10-14
  Administered 2023-10-14: 10:00:00 0.5 ug/kg/h via INTRAVENOUS
  Administered 2023-10-14: 13:00:00 0.8 ug/kg/h via INTRAVENOUS

## 2023-10-14 MED ORDER — BISACODYL 10 MG RE SUPP
10 mg | Freq: Two times a day (BID) | RECTAL | 0 refills | Status: DC
Start: 2023-10-14 — End: 2023-10-19
  Administered 2023-10-15 – 2023-10-19 (×8): 10 mg via RECTAL

## 2023-10-14 MED ORDER — MORPHINE 2 MG/ML IV SYRG
2 mg | Freq: Once | INTRAVENOUS | 0 refills | Status: CP
Start: 2023-10-14 — End: ?

## 2023-10-14 MED ORDER — AMLODIPINE 10 MG PO TAB
10 mg | Freq: Every day | GASTROSTOMY | 0 refills | Status: DC
Start: 2023-10-14 — End: 2023-10-19
  Administered 2023-10-15 – 2023-10-16 (×2): 10 mg via GASTROSTOMY

## 2023-10-14 MED ORDER — FUROSEMIDE 10 MG/ML IJ SOLN
40 mg | Freq: Once | INTRAVENOUS | 0 refills | Status: CP
Start: 2023-10-14 — End: ?
  Administered 2023-10-15: 05:00:00 40 mg via INTRAVENOUS

## 2023-10-14 MED ORDER — LABETALOL 5 MG/ML IV SOLN
10 mg | INTRAVENOUS | 0 refills | Status: CP | PRN
Start: 2023-10-14 — End: ?
  Administered 2023-10-14: 08:00:00 10 mg via INTRAVENOUS

## 2023-10-14 MED ORDER — ACETAMINOPHEN 1,000 MG/100 ML (10 MG/ML) IV SOLN
1000 mg | INTRAVENOUS | 0 refills | Status: DC
Start: 2023-10-14 — End: 2023-10-15
  Administered 2023-10-15: 12:00:00 1000 mg via INTRAVENOUS

## 2023-10-14 MED ORDER — OXYCODONE 10 MG PO TAB
10 mg | Freq: Three times a day (TID) | GASTROSTOMY | 0 refills | Status: DC
Start: 2023-10-14 — End: 2023-10-19
  Administered 2023-10-14 – 2023-10-17 (×9): 10 mg via GASTROSTOMY

## 2023-10-14 MED ORDER — TUBE FEED (ENAR)
0 refills | Status: DC
Start: 2023-10-14 — End: 2023-10-14

## 2023-10-14 MED ORDER — NICARDIPINE IN NACL (ISO-OS) 20 MG/200 ML (0.1 MG/ML) IV PGBK
5-15 mg/h | INTRAVENOUS | 0 refills | Status: DC
Start: 2023-10-14 — End: 2023-10-16
  Administered 2023-10-15 (×3): 10 mg/h via INTRAVENOUS
  Administered 2023-10-15: 21:00:00 15 mg/h via INTRAVENOUS
  Administered 2023-10-15: 12:00:00 10 mg/h via INTRAVENOUS
  Administered 2023-10-15: 20:00:00 15 mg/h via INTRAVENOUS
  Administered 2023-10-15: 02:00:00 12.5 mg/h via INTRAVENOUS
  Administered 2023-10-15: 05:00:00 10 mg/h via INTRAVENOUS
  Administered 2023-10-15: 23:00:00 15 mg/h via INTRAVENOUS
  Administered 2023-10-15 (×2): 10 mg/h via INTRAVENOUS
  Administered 2023-10-15: 03:00:00 15 mg/h via INTRAVENOUS
  Administered 2023-10-15: 01:00:00 7.5 mg/h via INTRAVENOUS
  Administered 2023-10-16: 02:00:00 12.5 mg/h via INTRAVENOUS
  Administered 2023-10-16: 09:00:00 5 mg/h via INTRAVENOUS
  Administered 2023-10-16: 15 mg/h via INTRAVENOUS
  Administered 2023-10-16: 12:00:00 5 mg/h via INTRAVENOUS
  Administered 2023-10-16: 06:00:00 7.5 mg/h via INTRAVENOUS
  Administered 2023-10-16: 03:00:00 10 mg/h via INTRAVENOUS

## 2023-10-14 MED ORDER — HYDRALAZINE 20 MG/ML IJ SOLN
10 mg | INTRAVENOUS | 0 refills | Status: DC | PRN
Start: 2023-10-14 — End: 2023-10-16
  Administered 2023-10-15: 19:00:00 10 mg via INTRAVENOUS

## 2023-10-14 MED ORDER — NICARDIPINE IN NACL (ISO-OS) 20 MG/200 ML (0.1 MG/ML) IV PGBK
5-15 mg/h | INTRAVENOUS | 0 refills | Status: DC
Start: 2023-10-14 — End: 2023-10-14
  Administered 2023-10-14: 10:00:00 7.5 mg/h via INTRAVENOUS

## 2023-10-14 MED ORDER — MORPHINE 2 MG/ML IV SYRG
2 mg | Freq: Once | INTRAVENOUS | 0 refills | Status: CP
Start: 2023-10-14 — End: ?
  Administered 2023-10-14: 06:00:00 2 mg via INTRAVENOUS

## 2023-10-14 MED ORDER — TRAZODONE 50 MG PO TAB
50 mg | Freq: Every evening | NASOGASTRIC | 0 refills | Status: DC | PRN
Start: 2023-10-14 — End: 2023-10-19
  Administered 2023-10-15 – 2023-10-17 (×3): 50 mg via NASOGASTRIC

## 2023-10-14 MED ORDER — ADULT PN CONTINUOUS-ION BASED
INTRAVENOUS | 0 refills | Status: CP
Start: 2023-10-14 — End: ?
  Administered 2023-10-15: 02:00:00 1560.0000 mL via INTRAVENOUS

## 2023-10-14 MED ORDER — FENTANYL CITRATE (PF) 50 MCG/ML IJ SOLN
50 ug | Freq: Once | INTRAVENOUS | 0 refills | Status: DC
Start: 2023-10-14 — End: 2023-10-14

## 2023-10-14 MED ORDER — HYDRALAZINE 20 MG/ML IJ SOLN
10 mg | Freq: Once | INTRAVENOUS | 0 refills | Status: CP
Start: 2023-10-14 — End: ?
  Administered 2023-10-14: 08:00:00 10 mg via INTRAVENOUS

## 2023-10-14 MED ORDER — VENLAFAXINE 75 MG PO TAB
75 mg | Freq: Two times a day (BID) | NASOGASTRIC | 0 refills | Status: DC
Start: 2023-10-14 — End: 2023-10-19
  Administered 2023-10-14 – 2023-10-18 (×9): 75 mg via NASOGASTRIC

## 2023-10-14 MED ORDER — NALOXONE 0.4 MG/ML IJ SOLN
.08 mg | INTRAVENOUS | 0 refills | Status: DC | PRN
Start: 2023-10-14 — End: 2023-10-14

## 2023-10-14 MED ORDER — MORPHINE PCA 50 MG/50 ML SYR (STD CONC)(ADULT)
INTRAVENOUS | 0 refills | Status: DC
Start: 2023-10-14 — End: 2023-10-14
  Administered 2023-10-14: 08:00:00 50.0000 mL via INTRAVENOUS

## 2023-10-14 MED ORDER — ASPIRIN 81 MG PO CHEW
81 mg | Freq: Every day | GASTROSTOMY | 0 refills | Status: DC
Start: 2023-10-14 — End: 2023-10-19
  Administered 2023-10-15 – 2023-10-18 (×4): 81 mg via GASTROSTOMY

## 2023-10-14 MED ORDER — MORPHINE 2 MG/ML IV SYRG
2 mg | INTRAVENOUS | 0 refills | Status: DC | PRN
Start: 2023-10-14 — End: 2023-10-15
  Administered 2023-10-15 (×8): 2 mg via INTRAVENOUS

## 2023-10-14 MED ORDER — AMITRIPTYLINE(#)/GABAPENTIN/BACLOFEN 2/6/2% TOPICAL CRM
TOPICAL | 0 refills | Status: DC
Start: 2023-10-14 — End: 2023-10-26
  Administered 2023-10-14 – 2023-10-20 (×2): via TOPICAL

## 2023-10-14 MED ORDER — IMS MIXTURE TEMPLATE
15 mg | Freq: Three times a day (TID) | GASTROSTOMY | 0 refills | Status: DC
Start: 2023-10-14 — End: 2023-10-19
  Administered 2023-10-14 – 2023-10-18 (×23): 15 mg via GASTROSTOMY

## 2023-10-14 MED ORDER — LIDOCAINE 5 % TP PTMD
2 | Freq: Every day | TOPICAL | 0 refills | Status: DC
Start: 2023-10-14 — End: 2023-10-27
  Administered 2023-10-14 – 2023-10-27 (×12): 2 via TOPICAL

## 2023-10-14 MED ORDER — IMS MIXTURE TEMPLATE
200 mg | Freq: Three times a day (TID) | GASTROSTOMY | 0 refills | Status: DC
Start: 2023-10-14 — End: 2023-10-19
  Administered 2023-10-14 – 2023-10-17 (×16): 200 mg via GASTROSTOMY

## 2023-10-14 MED ORDER — LABETALOL 5 MG/ML IV SOLN
10 mg | INTRAVENOUS | 0 refills | Status: DC | PRN
Start: 2023-10-14 — End: 2023-10-15

## 2023-10-14 MED ORDER — HYDRALAZINE 20 MG/ML IJ SOLN
10 mg | INTRAVENOUS | 0 refills | Status: DC | PRN
Start: 2023-10-14 — End: 2023-10-15

## 2023-10-14 MED ORDER — PANCRELIPASE-SODIUM BICARBONATE 20,880 K UNIT-650 MG
GASTROSTOMY | 0 refills | Status: DC | PRN
Start: 2023-10-14 — End: 2023-10-14

## 2023-10-14 MED ORDER — AMLODIPINE 5 MG PO TAB
5 mg | Freq: Every day | GASTROSTOMY | 0 refills | Status: DC
Start: 2023-10-14 — End: 2023-10-15
  Administered 2023-10-14: 18:00:00 5 mg via GASTROSTOMY

## 2023-10-14 MED ORDER — DIGOXIN 125 MCG (0.125 MG) PO TAB
125 ug | Freq: Every day | GASTROSTOMY | 0 refills | Status: DC
Start: 2023-10-14 — End: 2023-10-18
  Administered 2023-10-15 – 2023-10-16 (×2): 125 ug via GASTROSTOMY

## 2023-10-14 MED ORDER — LIDOCAINE 5 % TP PTMD
2 | Freq: Every day | TOPICAL | 0 refills | Status: DC
Start: 2023-10-14 — End: 2023-10-14

## 2023-10-14 MED ORDER — POTASSIUM CHLORIDE IN WATER 10 MEQ/50 ML IV PGBK
10 meq | Freq: Once | INTRAVENOUS | 0 refills | Status: CP
Start: 2023-10-14 — End: ?
  Administered 2023-10-14: 23:00:00 10 meq via INTRAVENOUS

## 2023-10-14 MED ORDER — SODIUM PHOSPHATE IVPB
30 MMOL | Freq: Once | INTRAVENOUS | 0 refills | Status: CP
Start: 2023-10-14 — End: ?
  Administered 2023-10-14 (×2): 30 mmol via INTRAVENOUS

## 2023-10-14 MED ORDER — ESMOLOL IN NACL (ISO-OSM) 2,500 MG/250 ML (10 MG/ML) IV SOLP
25-300 ug/kg/min | INTRAVENOUS | 0 refills | Status: DC
Start: 2023-10-14 — End: 2023-10-16
  Administered 2023-10-15 (×2): 50 ug/kg/min via INTRAVENOUS
  Administered 2023-10-15: 23:00:00 25 ug/kg/min via INTRAVENOUS
  Administered 2023-10-15: 15:00:00 75 ug/kg/min via INTRAVENOUS

## 2023-10-14 MED ORDER — METHOCARBAMOL 750 MG PO TAB
750 mg | GASTROSTOMY | 0 refills | Status: DC | PRN
Start: 2023-10-14 — End: 2023-10-15
  Administered 2023-10-14 – 2023-10-15 (×3): 750 mg via GASTROSTOMY

## 2023-10-14 MED ORDER — ACETAMINOPHEN 500 MG PO TAB
1000 mg | GASTROSTOMY | 0 refills | Status: DC
Start: 2023-10-14 — End: 2023-10-15
  Administered 2023-10-14 – 2023-10-15 (×2): 1000 mg via GASTROSTOMY

## 2023-10-14 MED ORDER — ATORVASTATIN 40 MG PO TAB
40 mg | Freq: Every day | GASTROSTOMY | 0 refills | Status: DC
Start: 2023-10-14 — End: 2023-10-19
  Administered 2023-10-14 – 2023-10-18 (×5): 40 mg via GASTROSTOMY

## 2023-10-14 MED ORDER — FUROSEMIDE 10 MG/ML IJ SOLN
40 mg | Freq: Once | INTRAVENOUS | 0 refills | Status: CP
Start: 2023-10-14 — End: ?
  Administered 2023-10-14: 16:00:00 40 mg via INTRAVENOUS

## 2023-10-14 MED ORDER — METOPROLOL TARTRATE 5 MG/5 ML IV SOLN
10 mg | INTRAVENOUS | 0 refills | Status: DC
Start: 2023-10-14 — End: 2023-10-15

## 2023-10-14 MED ORDER — OXYCODONE 5 MG PO TAB
5-15 mg | ORAL | 0 refills | Status: DC | PRN
Start: 2023-10-14 — End: 2023-10-19
  Administered 2023-10-14 (×2): 10 mg via ORAL
  Administered 2023-10-15: 5 mg via ORAL
  Administered 2023-10-15 (×2): 15 mg via ORAL
  Administered 2023-10-16 (×2): 5 mg via ORAL

## 2023-10-14 MED ADMIN — FENTANYL CITRATE (PF) 50 MCG/ML IJ SOLN [3037]: 50 ug | INTRAVENOUS | @ 04:00:00 | Stop: 2023-10-14 | NDC 00409909412

## 2023-10-14 MED ADMIN — NICARDIPINE IN NACL (ISO-OS) 20 MG/200 ML (0.1 MG/ML) IV PGBK [169819]: 5 mg/h | INTRAVENOUS | @ 08:00:00 | Stop: 2023-10-14 | NDC 43066000910

## 2023-10-14 MED ADMIN — MORPHINE 2 MG/ML IV SYRG [320653]: 2 mg | INTRAVENOUS | @ 07:00:00 | Stop: 2023-10-14 | NDC 00409189003

## 2023-10-14 MED ADMIN — SODIUM CHLORIDE 0.9% IV SOLP [27838]: 500 mL | INTRAVENOUS | @ 02:00:00 | Stop: 2023-10-14 | NDC 00338004903

## 2023-10-14 NOTE — Progress Notes
 PHYSICAL THERAPY  ASSESSMENT      Name: Blake Decker   MRN: [id number]     DOB: [birth year]      Age: 73 y.o.  Admission Date: 10/10/2023     LOS: 4 days     Date of Service: 10/14/2023      Mobility  Patient Turn/Position: Right  Progressive Mobility Level: Sit on edge of bed  Level of Assistance: Assist X2  Activity Limited By: Fatigue    Subjective  Significant Hospital Events: 73 y.o. 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, SMA stenosis s/p ex lap, SBR, in discontinuity ,abthera placement (1/13) angio SMA, hepatic artery, celiac stent  (1/14)  Mental / Cognitive: Alert;Cooperative;Follows commands  Pain: Complains of pain;Does not rate pain  Pain Location: Abdomen;Post-surgical  Pain Interventions: Patient agrees to participate in therapy with current pain level  Persons Present: Occupational Therapist;Spouse    Home Living Situation  Lives With: Spouse/significant other  Type of Home: House  Entry Stairs: No stairs  In-Home Stairs: Able to live on main level  Patient Owned Equipment: Walker with wheels;Wheelchair    Prior Level of Function  Level Of Independence: Independent with ADL and household mobility with device  History of Falls in Past 3 Months: Yes  Comments: Patient had a fall at the end of December and sustained a L fibula fracture. Per spouse, he is supposed to be NWB but was walking on it some to get into the bathroom because the wheelchair would not fit into it.  Occupation/Education: Retired    Precautions  L LE Precautions: LLE non-weight bearing  Comments: recent L fibula fx (around 09/27/23). NWB per spouse. Recommend ortho consult to assess.    ROM  R LE ROM: WFL  L LE ROM: WFL (except L ankle splinted)    Strength  Overall Strength: WFL    Bed Mobility/Transfer  Bed Mobility: Supine to Sit: Moderate Assist;Head of Bed Elevated;Assist with Trunk  Bed Mobility: Sit to Supine: Minimal Assist;x2 People;Assist with B LE;Assist with L LE  End Of Activity Status: In Bed;Nursing Notified;Instructed Patient to Request Assist with Mobility;Instructed Patient to Use Call Light    Activity/Exercise  Sit Edge Of Bed: 15 minutes  Sit Edge Of Bed Assist: Stand By Assist  Therapeutic Exercise: Seated;RUE;LUE;RLE;LLE;Active ROM    Education  Persons Educated: Patient/Family  Patient Barriers To Learning: None Noted  Teaching Methods: Verbal Instruction  Patient Response: Verbalized Understanding  Topics: Plan/Goals of PT Interventions;Mobility Progression;Importance of Increasing Activity;Up with Assist Only;Safety Awareness    Assessment/Progress  Impaired Mobility Due To: Pain;Decreased Activity Tolerance;Post Surgical Changes  Assessment/Progress: Should Improve w/ Continued PT    AM-PAC 6 Clicks Basic Mobility Inpatient  Turning from your back to your side while in a flat bed without using bed rails: A lot  Moving from lying on your back to sitting on the side of a flat bed without using bedrails : A Lot  Moving to and from a bed to a chair (including a wheelchair): Total  Standing up from a chair using your arms (e.g. wheelchair, or bedside chair): Total  To walk in hospital room: Total  Climbing 3-5 steps with a railing: Total  Basic Mobility Inpatient Raw Score: 8  Standardized (T-scale) Score: 22.61    Goals  Goal Formulation: With Patient  Time For Goal Achievement: 7 days  Patient Will Go Supine To/From Sit: w/ Minimal  Assist  Patient Will Transfer Bed/Chair: w/ Minimal Assist  Patient Will Transfer Sit to Stand: w/ Minimal Assist  Patient Will Ambulate: 1-10 Feet, w/ Dan Humphreys, w/ Minimal Assist    Plan  Treatment Interventions: Mobility training  Plan Frequency: 5 Days per Week  PT Plan for Next Visit: EOB, trial transfers to chair - squat pivot vs stand pivot vs slideboard    PT Discharge Recommendations  Recommendation: Inpatient setting      Therapist  Eddie Candle, PT  Date  10/14/2023

## 2023-10-14 NOTE — Progress Notes
 Surgical Critical Care Progress Note    Today's Date: 10/14/2023   Hospital Day: Hospital Day: 5    History of Present Illness:   Blake Decker is a 73 y.o. 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, SMA stenosis s/p ex lap, SBR, in discontinuity ,abthera placement (1/13) angio SMA, hepatic artery, celiac stent  (1/14)    59M known chronic mesenteric ischemia came to the ED on the 13th with increasing abdominal pain. CT obtained revealing severe mesenteric and celiac artery stenosis. EGS team was consulted and took him to the OR that same day. ICG small and large bowel angiography performed revealing necrosed mid-jejunum, approx 70 cm resected. Has since HDS off pressor with vascular planning to do angiogram w/ possible stenting today    Overnight events 1/16: nothing. Levo 0.07-0.08. To OR today for possible bowel anastomosis     Overnight events 1/17: hypertensive gave hydralazine did not resolve so placed on a cardene gtt. Pressures better MAPs at 92-97. Pain control issues, APS engaged for a TAPPS block. Considered a ketamine gtt but he was altered. fPCA also switched to a mPCA.     Patient Active Problem List    Diagnosis Date Noted    S/P small bowel resection 10/12/2023    Moderate malnutrition (HCC) 10/11/2023    Abdominal pain, generalized 10/11/2023    Diarrhea 10/11/2023    Mesenteric ischemia (HCC) 10/11/2023    Atrial fibrillation (HCC) 10/11/2023    Acute blood loss anemia 10/11/2023    Severe sepsis with septic shock (HCC) 10/11/2023    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    Arm pain, left 10/04/2018    Acute respiratory failure with hypoxia (HCC) 09/25/2018    Severe sepsis (HCC) 09/25/2018    Olecranon bursitis of left elbow 09/25/2018    Cellulitis 09/23/2018    Atrial fibrillation with RVR (HCC) 09/23/2018    Coronary artery disease involving native coronary artery 09/23/2018    COPD (chronic obstructive pulmonary disease) (HCC) 09/23/2018    OSA (obstructive sleep apnea) 09/23/2018       Assessment & Plan:   Neuro   Acute pain due to trauma  Multimodal pain regimen:  -Tylenol IV 1000mg  q6h prn   -Robaxin 1000 mg q8h prn   -Lidocain topical patch daily   -mPCA   -TAPPS block per APS    Sedation  -Precedex 0.8    GCS 15 (E4,V5,M6)    CV   Undifferentiated shock versus hypovolemic versus septic, HTN, Afib (Xarelto), CAD s/p PCI w/ stent (2014), dilated cardiomyopathy, angio SMA, hepatic artery, celiac stent (1/14)  -Vital signs stable, continue to monitor. MAP > 65. Off pressors. Has been off the cardene gtt since this morning (pain better controlled). [facility]ced from the [facility].   -PTA xarelto held. ASA/statin held. Metoprolol 7.5 mg q6h. Digoxin 100 mcg  -Continue hep gtt   -Mesenteric stens placed per vasc (1/14). Okay for hep gtt. Eventual PO ASA. No need for Plavix at discharge  - Per vasc okay for xarelto and ASA at discharge     Pulm  Acute hypoxic respiratory failure, COPD, OSA, tobaccoism   Started the night on 4L NC. Now on 10L satting well. Continue to wean as tolerated     GI/ Fluids / Electrolytes / Nutrition  Risk for constipation, Risk for electrolyte imbalance, chronic mesenteric ischemia, SMA stenosis s/p ex lap, SBR, in discontinuity ,abthera placement (  1/13) s/p ex lap, primary anastomosis, abdominal wall closure (1/16)  -Strict NPO. In continuity. Protonix 40 mg daily. NGT 1.4L/24hrs. Will discuss meds down NGT today. Expected ileus   -Bowel regimen: senna, miralax (held)   -TPN running   -Zofran prn nausea    GU    -Cr 0.82(0.97). UOP 2031  -Maintain foley     Heme/ID  Acute blood loss anemia  - Hgb 13.5(12.9). No clinical signs of overt bleeding. Continue to trend serially. Optimize fluid balance. Monitor stools for signs of occult GI bleeding.    Leukocytosis  -WBC 13(8.7). Afebrile   -Cultures 1/13   Urine, no growth. Fecal rotavirus, cryptosporidium, cdiff negative     Endo  -No acute issues. Monitor and treat prn. Glucose 150    MS  Impaired mobility and activities of daily living  -PT/OT    Daily Quality Checklist  Lines:  3 Peripheral IV   R IJ central line   Foley Cath  NGT  Activity progressive mobility   GI Prophylaxis: protonix  DVT Prophylaxis: hep gtt    Nutrition: NPO  Bowel Regimen: held  Lab Frequency: Daily   Daily CXR: No  Disposition: SICU    Subjective    Some pain present this morning     Objective:    Physical Exam:  General: alert, oriented, responsive   HEENT: Normocephalic, atraumatic; mucus membranes moist  Lungs: on nasal canula, normal effort of respiration   Heart: regular rate   Abdomen:  Soft, tender on deep palpation   Extremities:  No edema; Warm, without cyanosis    Labs:    Complete Blood Counts   Recent Labs     10/12/23  0317 10/13/23  0309 10/13/23  1224 10/14/23  0003   HGB 13.1* 13.0* 12.9* 13.5   HCT 40.4 39.4* 37.9* 40.9   WBC 13.00* 11.30* 8.70 13.00*   PLTCT 356 367 300 354   MCV 84.2 84.8 84.0 84.1   MCH 27.3 27.9 28.5 27.9   MCHC 32.4 32.9 33.9 33.1   RDW 14.6 14.7 14.8 14.7   MPV 8.2 8.0 7.8 7.7     No results for input(s): NEUT, LYMPH, MONO, POIK, OVAL, PLTEST in the last 72 hours.    Invalid input(s): EOSINOPHIL       Chemistry Panel   Recent Labs     10/12/23  0317 10/13/23  0309 10/13/23  1224 10/14/23  0003   NA 131* 132* 133* 132*   K 3.5 3.7 3.7 4.0   CL 94* 94* 95* 95*   CO2 27 27 28 28    BUN 25 24 23 16    CR 1.44* 1.00 0.97 0.82   GLU 154* 150* 122* 150*   GAP 10 11 10 9    GFR 52* >60 >60 >60   MG 2.1 1.9 1.8 2.3   CA 8.5 8.8 8.6 8.9   PO4 4.6* 3.5 4.5 2.3     Recent Labs     10/11/23  1112 10/13/23  1224   ALKPHOS 92 82   AST 18 7   ALT 12 6*   TOTPROT 5.9* 6.0   TOTBILI 1.7* 0.9   ALBUMIN 3.0* 3.0*        Coagulation Studies   Recent Labs     10/11/23  1112 10/11/23  1955 10/13/23  0309 10/13/23  1224 10/13/23  2039 10/14/23  0336   PTT 87.3*   < > 36.1 27.7 38.2* 37.9*   INR 1.8*  --   --  1.3*  -- --     < > = values in this interval not displayed.          Vital Signs (24 hours)  BP: (126-208)/(55-116)   ABP: (87-198)/(45-115)   Temp:  [36.5 ?C (97.7 ?F)-37.2 ?C (99 ?F)]   Pulse:  [67-111]   Respirations:  [10 PER MINUTE-29 PER MINUTE]   SpO2:  [85 %-97 %]   O2%:  [40 %]   O2 Device: High flow nasal cannula  O2 Liter Flow: 10 Lpm    Medications  Scheduled Meds:acetaminophen (OFIRMEV) 1,000 mg injection 100 mL, 1,000 mg, Intravenous, Q6H*  amitriptyline/gabapentin/baclofen(#) 2/6/2 % topical cream, , Topical, Q8H  [Held by Provider] amLODIPine (NORVASC) tablet 5 mg, 5 mg, Oral, QDAY  [Held by Provider] aspirin EC (ASPIR-LOW) tablet 81 mg, 81 mg, Oral, QDAY  aspirin rectal suppository 300 mg, 300 mg, Rectal, QDAY  [Held by Provider] atorvastatin (LIPITOR) tablet 40 mg, 40 mg, Oral, QDAY  [Held by Provider] busPIRone (BUSPAR) tablet 15 mg, 15 mg, Oral, TID  digoxin (LANOXIN) injection 100 mcg, 100 mcg, Intravenous, QDAY  lidocaine (LIDODERM) 5 % topical patch 2 patch, 2 patch, Topical, QDAY  [Held by Provider] lisinopriL (ZESTRIL) tablet 20 mg, 20 mg, Oral, BID  pantoprazole (PROTONIX) injection 40 mg, 40 mg, Intravenous, QDAY  [Held by Provider] pregabalin (LYRICA) capsule 200 mg, 200 mg, Oral, TID  [Held by Provider] rOPINIRole (REQUIP) tablet 1 mg, 1 mg, Oral, QHS  [Held by Provider] venlafaxine XR (EFFEXOR XR) capsule 150 mg, 150 mg, Oral, QDAY    Continuous Infusions:   Adult Continuous Parenteral Nutrition (PN) 55 mL/hr at 10/13/23 2025    dexMEDEtomidine (PRECEDEX) 400 mcg/NS 100 ml IV drip (premade) 0.8 mcg/kg/hr (10/14/23 0651)    heparin (porcine) 20,000 units/D5W 500 mL infusion (std conc)(premade) 2,000 Units/hr (10/14/23 0720)    morphine PCA 50 mg/50 mL infusion syr (std conc)(premade)       PRN and Respiratory Meds:albuterol-ipratropium Q4H PRN, methocarbamoL Q8H PRN, nalOXone PRN, ondansetron Q6H PRN **OR** ondansetron (ZOFRAN) IV Q6H PRN, [Held by Provider] polyethylene glycol 3350 QDAY PRN, [Held by Provider] sennosides-docusate sodium QDAY PRN      I have personally reviewed pertinent labs, medications, radiology, and diagnostic procedures including: active problem list, medication list, allergies, family history, social history, health maintenance, notes from last encounter, lab results, imaging.     Jonette Eva, MD  Electronically Signed  10/14/2023 7:31 AM  Available on Volate

## 2023-10-14 NOTE — Progress Notes
 OCCUPATIONAL THERAPY  ASSESSMENT NOTE      Name: Blake Decker   MRN: [id number]     DOB: 08/31/1951      Age: 73 y.o.  Admission Date: 10/10/2023     LOS: 4 days     Date of Service: 10/14/2023    Mobility  Progressive Mobility Level: Sit on edge of bed  Level of Assistance: Assist X2  Assistive Device: Hand Held  Activity Limited By: Fatigue    Subjective  Significant Hospital Events: 73 y.o. 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, SMA stenosis s/p ex lap, SBR, in discontinuity ,abthera placement (1/13) angio SMA, hepatic artery, celiac stent  (1/14)  Special Considerations: Current oxygen requirement (5L O2)  Mental / Cognitive: Alert;Cooperative;Follows commands  Pain: Complains of pain;Does not rate pain  Pain Location: Abdomen;Post-surgical  Pain Interventions: Patient agrees to participate in therapy with current pain level  Persons Present: Spouse;Physical Therapist    Home Living Situation  Lives With: Spouse/significant other  Type of Home: House  Entry Stairs: No stairs  In-Home Stairs: Able to live on main level  Bathroom Setup: Tub only (bathroom N/A with wheelchair)  Patient Owned Equipment: Environmental consultant with wheels;Wheelchair    Prior Level of Function  Level Of Independence: Independent with ADL and household mobility with device  History of Falls in Past 3 Months: Yes  Comments: Patient had a fall at the end of December and sustained a L fibula fracture. Per spouse, he is supposed to be NWB but was walking on it some to get into the bathroom because the wheelchair would not fit into it.  Occupation/Education: Retired  Comments: Spouse reports patient broke his ankle on 12/31 and has not been maintaining his NWB very well.    Precautions  L LE Precautions: LLE non-weight bearing  Comments: recent L fibula fx (around 09/27/23). NWB per spouse. Recommend ortho consult to assess.    ADL's  Where Assessed: Edge of Bed  Eating Deficits: NPO, but hand to mouth WNL  LE Dressing Assist: Maximum Assist  LE Dressing Deficits: Don/Doff R Sock    ADL Mobility  Bed Mobility: Supine to Sit: Moderate assist  Bed Mobility: Sit to Supine: Minimal assist;x2 people  End of Activity Status: Instructed patient to request assist with mobility;In bed;Instructed patient to use call light;Nursing notified  Transfer Comments: Sits EOB x15 minutes. Limited further activity 2/2 concern for elevated troponin.  Sitting Balance: Standby assist    Activity Tolerance  Endurance: 3/5 Tolerates 25-30 Minutes Exercise w/Multiple Rests    Cognition  Overall Cognitive Status: WFL to Adequately Complete Self Care Tasks Safely    ROM  R UE ROM: WFL   R UE ROM Method: Active  L UE ROM: WFL   L UE ROM Method: Active  R LE ROM: WFL  L LE ROM: WFL (except L ankle splinted)    UE Strength / Tone  R UE Strength: WFL  L UE Strength: WFL    Education  Persons Educated: Patient  Topics: Role of OT, Goals for Therapy  Goal Formulation: With Patient    Assessment  Assessment: Decreased ADL Status;Decreased Endurance;Decreased Self-Care Trans;Decreased High-Level ADLs  Prognosis: Good  Goal Formulation: Pt/family    AM-PAC 6 Clicks Daily Activity Inpatient  Putting on and taking off regular lower body clothes: A Lot  Bathing (Including washing, rinsing, drying): A Lot  Toileting, which includes using  toilet, bedpan, or urinal: Total  Putting on and taking off regular upper body clothing: None  Taking care of personal grooming such as brushing teeth: None  Eating meals: None  Daily Activity Raw Score: 17  Standardized (T-scale) Score: 37.26    Plan  OT Frequency: 5x/week  OT Plan for Next Visit: stand/pivot vs. squat/pivot transfers; toileting on commode    ADL Goals  Patient Will Perform All ADL's: w/ Stand By Assist    Functional Transfer Goals  Pt Will Transfer To Bedside Commode: w/ Stand By Assist    OT Discharge Recommendations  Recommendation: Inpatient setting    Therapist: Nelma Rothman, OTR/L [id number]  Date: 10/14/2023

## 2023-10-14 NOTE — Progress Notes
 0300: SICU team notified patient having chest pain and rating it an 8/10. EKG and troponin ordered.    0400: SICU team notified patient is still in pain, anxious, and restless. Physician assessed patient at bedside. Morphine PCA dose increased and precedex ordered.

## 2023-10-14 NOTE — Progress Notes
 Nutrition Support Service (NSS) Progress Note    Recommendations:   If able to safely able to advance to PO diet, REC consistency appropriate for safe swallow with no dietary related restrictions to optimize po intake.      If unable to tolerate PO diet and NGT output decreases, REC start volume based feeds of Nutren 1.5 at 20 ml/hr, with goal of 55 ml/hr + 2 prosource protein/day (at goal to provide  2140 kcals, 129g protein and 1003 ml free water.   (24-hr goal volume: 1320 ml; 4-hr goal volume: 220 ml); Additional fluids per primary team with minimum of 30 ml water q4 hrs for tube patency.    Could with discontinue PN once tolerating PO or EN consistently meeting ~65% of estimated nutrition needs.      Subjective: Pt extubated post-op.  Remains NPO, NGT set to LIWS.  Gastric output (NGT/OGT): 1.95L/24 hrs.  Holding off on EN for now, Continuing with PN.     PN therapy start date: 10/13/2023  PN Indication: NPO for 7 days or more (to date or expected)   Estimated PN therapy end date: unknown, when EN vs PO feasible and meeting at least 65% estimated needs consistently     Diet Order: NPO  TF: n/a  Pertinent allergies/intolerances: No known    I/O: 2588 ml/ 3986 ml OGT: 500 ml (removed)   NGT: 1455 ml  UOP: 2031 ml    Meds: Scheduled Meds:acetaminophen (TYLENOL EXTRA STRENGTH) tablet 1,000 mg, 1,000 mg, Feeding Tube, Q6H*  amitriptyline/gabapentin/baclofen(#) 2/6/2 % topical cream, , Topical, Q8H  amLODIPine (NORVASC) tablet 5 mg, 5 mg, Feeding Tube, QDAY  [START ON 10/15/2023] aspirin chewable tablet 81 mg, 81 mg, Feeding Tube, QDAY  atorvastatin (LIPITOR) tablet 40 mg, 40 mg, Feeding Tube, QDAY  busPIRone (BUSPAR) tablet 15 mg, 15 mg, Feeding Tube, TID  [START ON 10/15/2023] digoxin (LANOXIN) tablet 125 mcg, 125 mcg, Feeding Tube, QDAY  lidocaine (LIDODERM) 5 % topical patch 2 patch, 2 patch, Topical, QDAY  oxyCODONE tablet 10 mg, 10 mg, Feeding Tube, TID  pantoprazole (PROTONIX) injection 40 mg, 40 mg, Intravenous, QDAY  pregabalin (LYRICA) capsule 200 mg, 200 mg, Feeding Tube, TID  rOPINIRole (REQUIP) tablet 1 mg, 1 mg, Oral, QHS  venlafaxine (EFFEXOR) tablet 75 mg, 75 mg, Per [place] Tube, BID    Continuous Infusions:   Adult Continuous Parenteral Nutrition (PN)      Adult Continuous Parenteral Nutrition (PN) 55 mL/hr at 10/13/23 2025    dexMEDEtomidine (PRECEDEX) 400 mcg/NS 100 ml IV drip (premade) Stopped (10/14/23 0947)    heparin (porcine) 20,000 units/D5W 500 mL infusion (std conc)(premade) 2,100 Units/hr (10/14/23 1256)    morphine PCA 50 mg/50 mL infusion syr (std conc)(premade)       PRN and Respiratory Meds:albuterol-ipratropium Q4H PRN, methocarbamoL Q8H PRN, nalOXone PRN, ondansetron Q6H PRN **OR** ondansetron (ZOFRAN) IV Q6H PRN, oxyCODONE Q4H PRN, polyethylene glycol 3350 QDAY PRN, sennosides-docusate sodium QDAY PRN    Electrolyte Treatments: 30 mEq IV KCl x2, 4g IV magnesium 1/16; none so far 1/17    Pertinent Labs:   Comprehensive Metabolic Profile    Lab Results   Component Value Date/Time    NA 132 (L) 10/14/2023 12:03 AM    K 4.0 10/14/2023 12:03 AM    CL 95 (L) 10/14/2023 12:03 AM    CO2 28 10/14/2023 12:03 AM    GAP 9 10/14/2023 12:03 AM    BUN 16 10/14/2023 12:03 AM    CR 0.82 10/14/2023 12:03  AM    GLU 150 (H) 10/14/2023 12:03 AM    Lab Results   Component Value Date/Time    CA 8.9 10/14/2023 12:03 AM    PO4 2.3 10/14/2023 12:03 AM    ALBUMIN 3.0 (L) 10/13/2023 12:24 PM    TOTPROT 6.0 10/13/2023 12:24 PM    ALKPHOS 82 10/13/2023 12:24 PM    AST 7 10/13/2023 12:24 PM    ALT 6 (L) 10/13/2023 12:24 PM    TOTBILI 0.9 10/13/2023 12:24 PM    GFR >60 10/14/2023 12:03 AM    GFRAA >60 05/28/2019 06:06 PM        Lab Results   Component Value Date    MG 2.3 10/14/2023     Lab Results   Component Value Date    TRIG 70 09/23/2018     No results found for: GLUPOC    Admit Weight (Kg): Weight: 117.9 kg (260 lb)   Current Weight (Kg): Weight: 109.7 kg (241 lb 13.5 oz)  TPN Dose Weight (Kg): 83.3 kg (desired wt at BMI=24.9)    Estimated Kcal Needs:2100 (25 kcal/kg)  Estimated Protein Needs:125-140g  (1.5-1.7 g/kg)    Malnutrition Details:  Malnutrition present on admission  ICD-10 code E44: Acute illness/Moderate non-severe malnutrition  Energy Intake: Less than 75% of estimated energy requirement for greater than 7days, Weight loss: 1-2% x 1 week            Loss of Subcutaneous Fat: No      Muscle Wasting: No                   Malnutrition Interventions: Nutrition assessment, avoid prolonged NPO status, monitor nutrition plans.    A/P:   Blake Decker is a 73 y.o. male with a PMH that includes HTN, Afib, CAD s/p PCI w/ stent (2014), dilated cardiomyopathy, PVD, COPD, OSA, tobaccoism who presented with increasing abdominal pain. 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  Now, s/p re-opening of recent laparotomy, enterectomy resection and anastomosis on 1/16.  Started on PN 1/16 due to current nutrition status and anticipated continued NPO status.  (Refer to NSS consult 1/16 for nutrition/wt hx details).      1/17: Pt extubated post-op, but remains NPO with NGT to LIWS, OGT/NGT output 1.9L/24 hrs.  Day 2 of central PN.  Pt discussed during ICU rounds, planning to continue NGT to [facility] for next 24 hrs, if improved, will assess swallow for ability to start PO intake vs EN.  Plan to add SMOF lipids to PN, increasing dextrose by 40g, increasing PN volume to accommodate increased macronutrients. Adjusting sodium to maintain LR equivalent.   Optimizing K+, and Phos based on lab trends. Note 4g mag replaced external to PN--today's 2.3, likely reflective of this--continuing with current amount in PN.   Continuing with supplemental Thiamine in PN until lytes stable on goal PN macro's.      PN Changes (to start at 20:30):  Increased PN volume from 1320 to 1560 ml  Increased dextrose from 150 to 190g  Increased sodium from 171 to 203 mEq  Increased potassium from 40 to 50 mEq  Increased phosphorus from 14 to 22 mmol  Increased SMOF ipids from 0 to 250 ml.      PN Order (to start at 20:30):  Nonstandard TPN: 27ml/hr x 24hr (  total volume)    Macronutrients: AA  130g (goal), Dex 190 g (goal=280 g), SMOFlipids 250 ml (goal=315 ml)    Lytes: Na 203 mEq, K 50 mEq, Ca 10 mEq, Mg 16 mEq, Phos 22 mmol, 3:1 [id number]  Additives: 10 ml multivitamin, 1ml trace elements, and 100mg  thiamine    PN to provide 1666 kcals (20 kcals/kg; 79% estimated needs), 130 g AA (1.56 g/kg, 100% estimated needs)    On weekends/holidays, no action needs to be taken, if no changes are desired.  For PN changes/questions, NSS is available on Voalte or by pager [phone] for assistance.     [place]land Pink, RD, LD, CNSC  Available on Voalte Me

## 2023-10-14 NOTE — Progress Notes
 END OF SHIFT SUMMARY [facility]    Admission Date: 10/10/2023  Length of Stay: LOS: 4 days        Acute events and nursing interventions: Patient weaned off precedex.     Communication with providers (include name/title): Dr. Briant Cedar, MD and Dr. Carlynn Purl, MD notified of patients increased gastric residuals throughout shift. 1.2L of NGT output by end of shift documented. Dr Briant Cedar, MD notified of patient being hypertensive during 1800 BP check. PRN hydralazine administered. Patient persistently hypertensive. Cardene drip initiated at 1839 for SBP > 180. Refer to [facility] for titrations.       Did patient sleep overnight? N/A      Patient demeanor and cognition: Patient lethargic throughout shift. Oriented x4      Pain  Was the patient's pain controlled this shift? Yes. Education provided to patient regarding pain control goal being tolerable pain. Patient verbalized understanding. Patient consistently grading pain between 8-10 while morphine PCA running, and after discontinuation of PCA PRN pain meds administered for pain control in conjunction with scheduled medications. Morphine PCA stopped at 1443. Ice packs utilized.     If no, what new interventions were implemented?            Is the patient intubated? No      Patient Education  Patient education provided: Fall bundle and prevention, First dose medication education, Mobility goals, Pain management, and Skin integrity and wound care  Other patient education provided:  Learners: Patient and Significant Other  Method/materials used: Verbal teaching  Response to learning: Some Evidence of Learning, Needs Reinforcement    Patient Goal(s):  Patient will Be able to ambulate safely and Report pain is controlled/improved by discharge date

## 2023-10-15 ENCOUNTER — Inpatient Hospital Stay: Admit: 2023-10-15 | Discharge: 2023-10-15 | Payer: MEDICARE

## 2023-10-15 ENCOUNTER — Encounter: Admit: 2023-10-15 | Discharge: 2023-10-15 | Payer: MEDICARE

## 2023-10-15 LAB — PTT (APTT)
~~LOC~~ BKR PTT: 55 s — ABNORMAL HIGH (ref 24.0–36.5)
~~LOC~~ BKR PTT: 49 s — ABNORMAL HIGH (ref 24.0–36.5)
~~LOC~~ BKR PTT: 56 s — ABNORMAL HIGH (ref 24.0–36.5)

## 2023-10-15 LAB — BASIC METABOLIC PANEL
~~LOC~~ BKR ANION GAP: 10 mL/min (ref 3–12)
~~LOC~~ BKR CO2: 30 mmol/L (ref 21–30)
~~LOC~~ BKR CO2: 30 mmol/L — ABNORMAL LOW (ref 21–30)
~~LOC~~ BKR GLOMERULAR FILTRATION RATE (GFR): >60 mL/min (ref >60)

## 2023-10-15 MED ORDER — MORPHINE PCA 50 MG/50 ML SYR (STD CONC)(ADULT)
INTRAVENOUS | 0 refills | Status: DC
Start: 2023-10-15 — End: 2023-10-15

## 2023-10-15 MED ORDER — POTASSIUM CHLORIDE IN WATER 10 MEQ/50 ML IV PGBK
10 meq | INTRAVENOUS | 0 refills | Status: DC
Start: 2023-10-15 — End: 2023-10-15

## 2023-10-15 MED ORDER — POLYETHYLENE GLYCOL 3350 17 GRAM PO PWPK
1 | Freq: Every day | ORAL | 0 refills | Status: DC
Start: 2023-10-15 — End: 2023-10-16
  Administered 2023-10-15: 14:00:00 17 g via ORAL

## 2023-10-15 MED ORDER — SENNOSIDES-DOCUSATE SODIUM 8.6-50 MG PO TAB
1 | Freq: Every day | ORAL | 0 refills | Status: DC
Start: 2023-10-15 — End: 2023-10-16
  Administered 2023-10-15: 14:00:00 1 via ORAL

## 2023-10-15 MED ORDER — ADULT PN CONTINUOUS-ION BASED
INTRAVENOUS | 0 refills | Status: CP
Start: 2023-10-15 — End: ?
  Administered 2023-10-16: 02:00:00 1680.0000 mL via INTRAVENOUS

## 2023-10-15 MED ORDER — POTASSIUM CHLORIDE 20 MEQ PO TBTQ
40 meq | Freq: Once | ORAL | 0 refills | Status: DC
Start: 2023-10-15 — End: 2023-10-15

## 2023-10-15 MED ORDER — POTASSIUM CHLORIDE 20 MEQ/15 ML PO LIQD
30 meq | Freq: Once | NASOGASTRIC | 0 refills | Status: CP
Start: 2023-10-15 — End: ?
  Administered 2023-10-15: 30 meq via NASOGASTRIC

## 2023-10-15 MED ORDER — MORPHINE PCA 50 MG/50 ML SYR (STD CONC)(ADULT)
INTRAVENOUS | 0 refills | Status: DC
Start: 2023-10-15 — End: 2023-10-19
  Administered 2023-10-15: 20:00:00 50.0000 mL via INTRAVENOUS

## 2023-10-15 MED ORDER — HYDRALAZINE 20 MG/ML IJ SOLN
10 mg | INTRAVENOUS | 0 refills | Status: DC | PRN
Start: 2023-10-15 — End: 2023-10-19
  Administered 2023-10-18 (×2): 10 mg via INTRAVENOUS

## 2023-10-15 MED ORDER — METHOCARBAMOL 750 MG PO TAB
750 mg | GASTROSTOMY | 0 refills | Status: DC
Start: 2023-10-15 — End: 2023-10-17
  Administered 2023-10-15 – 2023-10-17 (×7): 750 mg via GASTROSTOMY

## 2023-10-15 MED ORDER — LABETALOL 5 MG/ML IV SOLN
10 mg | INTRAVENOUS | 0 refills | Status: DC | PRN
Start: 2023-10-15 — End: 2023-10-18
  Administered 2023-10-16 – 2023-10-18 (×7): 10 mg via INTRAVENOUS

## 2023-10-15 MED ORDER — FUROSEMIDE 10 MG/ML IJ SOLN
40 mg | Freq: Once | INTRAVENOUS | 0 refills | Status: CP
Start: 2023-10-15 — End: ?
  Administered 2023-10-15: 15:00:00 40 mg via INTRAVENOUS

## 2023-10-15 MED ORDER — ACETAMINOPHEN 1,000 MG/100 ML (10 MG/ML) IV SOLN
1000 mg | INTRAVENOUS | 0 refills | Status: DC
Start: 2023-10-15 — End: 2023-10-25
  Administered 2023-10-15 – 2023-10-25 (×36): 1000 mg via INTRAVENOUS

## 2023-10-15 MED ORDER — METOPROLOL TARTRATE 50 MG PO TAB
50 mg | Freq: Two times a day (BID) | ORAL | 0 refills | Status: DC
Start: 2023-10-15 — End: 2023-10-17
  Administered 2023-10-15 – 2023-10-17 (×4): 50 mg via ORAL

## 2023-10-15 MED ORDER — NALOXONE 0.4 MG/ML IJ SOLN
.08 mg | INTRAVENOUS | 0 refills | Status: DC | PRN
Start: 2023-10-15 — End: 2023-10-19

## 2023-10-15 MED ORDER — FUROSEMIDE 10 MG/ML IJ SOLN
40 mg | Freq: Once | INTRAVENOUS | 0 refills | Status: CP
Start: 2023-10-15 — End: ?
  Administered 2023-10-15: 40 mg via INTRAVENOUS

## 2023-10-15 MED ORDER — POTASSIUM PHOSPHATE, MONOBASIC 500 MG PO TBSO
2 | Freq: Once | NASOGASTRIC | 0 refills | Status: CP
Start: 2023-10-15 — End: ?
  Administered 2023-10-15: 14:00:00 2 via NASOGASTRIC

## 2023-10-15 MED ADMIN — HYDRALAZINE 20 MG/ML IJ SOLN [3697]: 10 mg | INTRAVENOUS | Stop: 2023-10-15 | NDC 63323061400

## 2023-10-15 NOTE — Progress Notes
 Critical Care Attending Physician ATTESTATION    I have seen, personally fully evaluated, and discussed patient with the provider.  I agree with the objective findings and agree with the plan of care as documented by the provider with the exceptions noted.      The patient is critically ill with the below problems.  I spent 30 minutes (excluding time spent performing or supervising any procedures) providing and personally directing critical care services including review of medical record, interpretation of laboratory and imaging values, hemodynamic monitoring, titration of vasoactive medication, fluid management, coordination of care and services separate from surgical procedures.    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: continue VSU periop mgmt, AC with heparin infusion to therapeutic lovenox to reduce IV volumes, monitor abdominal exam, incisions,  - Atrial fibrillation (Xarelto): close rhythm monitoring, rate control with metoprolol/digoxin, AC with tlovenox  - Acute hypoxic respiratory failure: extubated, now weaning NC O2 with volume offloading as above  - Hx of COPD: close respiratory monitoring, continue PTA neg/inhalers, aggressive RT therapies  - 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 s/p anastomosis/closure: NPO, NG, AROBF, monitor incision, high NG output 1.6 L, continue TPN, close monitor exam in setting of increasing WBC of unclear etiology, may require re-exploration of clinical decompensation  - Acute blood loss anemia: H 13, close monitor need for transfusion  - Thrombocytosis: Plt 454, close monitor in setting of critical illness  - Leukocytosis: AF, W 29, monitor WBC, abdominal exam, CXR without evidence of PNA  - Hyperglycemia, Hx of DM: close monitor POC glucose, SSI  - Decreased functional mobility: PT/OT    - Nutrition/BMR: NPO and TPN  - VTE Prophylaxis: therapeutic lovenox   - GI Prophylaxis: NI  - Lines/Tube/Drains: PIV, NG, Foley, and CVC  - Code Status: full  - SBIRT: Not Applicable  - Palliative Screen: Not applicable  - Disposition: pending  - Level of Care: ICU    Recent Labs     10/13/23  1224 10/14/23  0003 10/14/23  1443 10/15/23  0359 10/15/23  1422 10/16/23  0323   WBC 8.70 13.00*  --  22.10*  --  29.80*   HGB 12.9* 13.5  --  13.2*  --  13.5   PLTCT 300 354  --  455*  --  454*   NA 133* 132*   < > 137 137 141   K 3.7 4.0   < > 3.5 3.7 3.5   CL 95* 95*   < > 97* 97* 104   CO2 28 28   < > 30 30 30    BUN 23 16   < > 29* 30* 32*   CR 0.97 0.82   < > 0.65 0.59 0.57   GLU 122* 150*   < > 148* 221* 148*   MG 1.8 2.3  --  2.0  --  1.7   ALKPHOS 82  --   --   --   --   --    AST 7  --   --   --   --   --    ALT 6*  --   --   --   --   --  LACTIC 1.0 2.0  --   --   --   --    PHART 7.38  --   --   --   --   --    PCO2A 51*  --   --   --   --   --    PO2ART 83  --   --   --   --   --    HCO3A 27.5  --   --   --   --   --    BASEEXA 3.5  --   --   --   --   --     < > = values in this interval not displayed.       No results found for the encounter in last 7 days.

## 2023-10-15 NOTE — Progress Notes
 Surgical Critical Care Progress Note    Today's Date: 10/15/2023   Hospital Day: Hospital Day: 6    History of Present Illness:   Blake Decker is a 73 y.o. 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, SMA stenosis s/p ex lap, SBR, in discontinuity, abthera placement (1/13) s/p angio SMA, hepatic artery, celiac stent  (1/14) s/p ex lap, primary anastomosis, abdominal wall closure (1/16)    Overnight events 1/17: hypertensive gave hydralazine did not resolve so placed on a cardene gtt. Pressures better MAPs at 92-97. Pain control issues, APS engaged for a TAPPS block. Considered a ketamine gtt but he was altered. fPCA also switched to a mPCA    Overnight 1/18: Systolic over 200. Placed back on cardene gtt. Put on esmolol gtt. 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    Arm pain, left 10/04/2018    Acute respiratory failure with hypoxia (HCC) 09/25/2018    Severe sepsis (HCC) 09/25/2018    Olecranon bursitis of left elbow 09/25/2018    Cellulitis 09/23/2018    Atrial fibrillation with RVR (HCC) 09/23/2018    Coronary artery disease involving native coronary artery 09/23/2018    COPD (chronic obstructive pulmonary disease) (HCC) 09/23/2018    OSA (obstructive sleep apnea) 09/23/2018       Assessment & Plan:   Neuro   Acute pain due to trauma  Multimodal pain regimen:  -Tylenol IV 1000mg  q6h prn   -Robaxin 750 mg q8h prn   -Lidocaine topical patch daily   -IV morphine 2 mg q2h prn   -Oxy 10 mg TID. Oxy 5-15 mg q4h prn   -TAPPS block per APS    Sedation  -none    GCS 15 (E4,V5,M6)    CV   Undifferentiated shock versus hypovolemic versus septic, HTN, Afib (Xarelto), CAD s/p PCI w/ stent (2014), dilated cardiomyopathy, angio SMA, hepatic artery, celiac stent (1/14)  -Vital signs stable, continue to monitor. MAP > 65. Off pressors. On cardene and esmolol gtt. MAP 87-111  -Amlodipine 10 mg. Hydralazine 10 mg q3h prn. Wean off esmolol gtt today   -PTA xarelto held. ASA/statin held. PTA Metoprolol. Digoxin 100 mcg  -Continue hep gtt   -Mesenteric stens placed per vasc (1/14). Okay for hep gtt. Eventual PO ASA. No need for Plavix at discharge  - Per vasc okay for xarelto and ASA at discharge     Pulm  Acute hypoxic respiratory failure, COPD, OSA, tobaccoism   On 4L NC. CXR and ABG ordered. Will discuss further diuresis contingent imaging     GI/ Fluids / Electrolytes / Nutrition  Risk for constipation, Risk for electrolyte imbalance, chronic mesenteric ischemia, SMA stenosis s/p ex lap, SBR, in discontinuity ,abthera placement (1/13) s/p ex lap, primary anastomosis, abdominal wall closure (1/16)  -Strict NPO. In continuity. Protonix 40 mg daily. NGT 2.15L/24hrs. Meds per NGT  -KUB ordered this AM  -Home  lasix 40 mg every morning currently held. 40 mg lasix given last night. Will discuss this AM  -Bowel regimen: senna, miralax   -TPN running   -Zofran prn nausea    GU    -Cr 0.65(0.77). UOP 3435  -Maintain foley     Heme/ID  Acute blood loss anemia  - Hgb 13.2(13.5). No clinical signs of overt bleeding. Continue to trend serially. Optimize fluid balance. Monitor stools for signs of occult GI bleeding.    Leukocytosis  -WBC 22(13). Afebrile   -Cultures 1/13   Urine, no growth. Fecal rotavirus, cryptosporidium, cdiff negative     Endo  -No acute issues. Monitor and treat prn. Glucose 148    MS  Impaired mobility and activities of daily living  -PT/OT    Daily Quality Checklist  Lines:  3 Peripheral IV   R IJ central line   Foley Cath  NGT  Activity progressive mobility   GI Prophylaxis: protonix  DVT Prophylaxis: hep gtt    Nutrition: NPO, sip with meds   Bowel Regimen: miralax, senna   Lab Frequency: Daily   Daily CXR: No  Disposition: SICU    Subjective    Some pain present this morning     Objective:    Physical Exam:  General: alert, oriented, responsive   HEENT: Normocephalic, atraumatic; mucus membranes moist  Lungs: on nasal canula, normal effort of respiration   Heart: regular rate   Abdomen:  Soft, tender on deep palpation   Extremities:  No edema; Warm, without cyanosis    Labs:    Complete Blood Counts   Recent Labs     10/13/23  0309 10/13/23  1224 10/14/23  0003 10/15/23  0359   HGB 13.0* 12.9* 13.5 13.2*   HCT 39.4* 37.9* 40.9 40.6   WBC 11.30* 8.70 13.00* 22.10*   PLTCT 367 300 354 455*   MCV 84.8 84.0 84.1 84.3   MCH 27.9 28.5 27.9 27.5   MCHC 32.9 33.9 33.1 32.6   RDW 14.7 14.8 14.7 14.5   MPV 8.0 7.8 7.7 8.0     No results for input(s): NEUT, LYMPH, MONO, POIK, OVAL, PLTEST in the last 72 hours.    Invalid input(s): EOSINOPHIL       Chemistry Panel   Recent Labs     10/13/23  0309 10/13/23  1224 10/14/23  0003 10/14/23  1443 10/15/23  0359   NA 132* 133* 132* 137 137   K 3.7 3.7 4.0 3.9 3.5   CL 94* 95* 95* 95* 97*   CO2 27 28 28  31* 30   BUN 24 23 16 24  29*   CR 1.00 0.97 0.82 0.77 0.65   GLU 150* 122* 150* 145* 148*   GAP 11 10 9 11 10    GFR >60 >60 >60 >60 >60   MG 1.9 1.8 2.3  --  2.0   CA 8.8 8.6 8.9 9.3 9.4   PO4 3.5 4.5 2.3  --  1.8*     Recent Labs     10/13/23  1224   ALKPHOS 82   AST 7   ALT 6*   TOTPROT 6.0   TOTBILI 0.9   ALBUMIN 3.0*        Coagulation Studies   Recent Labs     10/13/23  1224 10/13/23  2039 10/14/23  1015 10/14/23  1721 10/14/23  2308 10/15/23  0359   PTT 27.7   < > 46.5* 55.1* 49.2* 42.4*   INR 1.3*  --   --   --   --   --     < > =  values in this interval not displayed.          Vital Signs (24 hours)  BP: (130-231)/(57-117)   Temp:  [36.4 ?C (97.5 ?F)-36.7 ?C (98.1 ?F)]   Pulse:  [57-113]   Respirations:  [12 PER MINUTE-26 PER MINUTE]   SpO2:  [84 %-95 %]   O2 Device: High flow nasal cannula  O2 Liter Flow: 4 Lpm    Medications  Scheduled Meds:acetaminophen (OFIRMEV) 1,000 mg injection 100 mL, 1,000 mg, Intravenous, Q8H  amitriptyline/gabapentin/baclofen(#) 2/6/2 % topical cream, , Topical, Q8H  amLODIPine (NORVASC) tablet 10 mg, 10 mg, Feeding Tube, QDAY  aspirin chewable tablet 81 mg, 81 mg, Feeding Tube, QDAY  atorvastatin (LIPITOR) tablet 40 mg, 40 mg, Feeding Tube, QDAY  bisacodyL (DULCOLAX) rectal suppository 10 mg, 10 mg, Rectal, BID  busPIRone (BUSPAR) tablet 15 mg, 15 mg, Feeding Tube, TID  digoxin (LANOXIN) tablet 125 mcg, 125 mcg, Feeding Tube, QDAY  lidocaine (LIDODERM) 5 % topical patch 2 patch, 2 patch, Topical, QDAY  metoprolol tartrate (LOPRESSOR) tablet 50 mg, 50 mg, Oral, BID  oxyCODONE (ROXICODONE) tablet 10 mg, 10 mg, Feeding Tube, TID  pantoprazole (PROTONIX) injection 40 mg, 40 mg, Intravenous, QDAY  polyethylene glycol 3350 (MIRALAX) packet 17 g, 1 packet, Oral, QDAY  pregabalin (LYRICA) capsule 200 mg, 200 mg, Feeding Tube, TID  rOPINIRole (REQUIP) tablet 1 mg, 1 mg, Oral, QHS  sennosides-docusate sodium (SENOKOT-S) tablet 1 tablet, 1 tablet, Oral, QDAY  venlafaxine (EFFEXOR) tablet 75 mg, 75 mg, Per [place] Tube, BID    Continuous Infusions:   Adult Continuous Parenteral Nutrition (PN) 65 mL/hr at 10/14/23 2019    esmolol (BREVIBLOC)  2500 mg/250 mL NS IV drip (std conc) 75 mcg/kg/min (10/15/23 0840)    heparin (porcine) 20,000 units/D5W 500 mL infusion (std conc)(premade) 2,200 Units/hr (10/15/23 0927)    niCARdipine (cardENE) 20 mg/200 mL NS IV drip (std conc)(premade) 10 mg/hr (10/15/23 1053)     PRN and Respiratory Meds:albuterol-ipratropium Q4H PRN, hydrALAZINE Q3H PRN, methocarbamoL Q8H PRN, morphine  injection syringe Q2H PRN, nalOXone PRN, ondansetron Q6H PRN **OR** ondansetron (ZOFRAN) IV Q6H PRN, oxyCODONE Q4H PRN, traZODone QHS PRN      I have personally reviewed pertinent labs, medications, radiology, and diagnostic procedures including: active problem list, medication list, allergies, family history, social history, health maintenance, notes from last encounter, lab results, imaging.     Jonette Eva, MD  Electronically Signed  10/15/2023 11:01 AM  Available on [employer]

## 2023-10-15 NOTE — Progress Notes
 Nutrition Support Service (NSS) On-Call Note    Recommendations:   If able to safely able to advance to PO diet, REC consistency appropriate for safe swallow with no dietary related restrictions to optimize po intake.    If unable to tolerate PO diet and NGT output decreases, REC start volume based feeds of Nutren 1.5 at 20 ml/hr, with goal of 55 ml/hr + 2 prosource protein/day (at goal to provide  2140 kcals, 129g protein and 1003 ml free water.   (24-hr goal volume: 1320 ml; 4-hr goal volume: 220 ml); Additional fluids per primary team with minimum of 30 ml water q4 hrs for tube patency.    Could with discontinue PN once tolerating PO or EN consistently meeting ~65% of estimated nutrition needs.    Diet Order: NPO  TF: n/a    Electrolyte Treatments:   (1/17) KCl 10 mEq IV, NaPhos 30 mmol IV  (1/18) KPhos 2 tab via NGT    Admit Weight (Kg): Weight: 117.9 kg (260 lb)   Current Weight (Kg): Weight: 109.7 kg (241 lb 13.5 oz)  TPN Dose Weight (Kg): 83.3 kg (desired wt at BMI=24.9)    Estimated Kcal Needs:2100 (25 kcal/kg)  Estimated Protein Needs:125-140g  (1.5-1.7 g/kg)    1/18: Pt reviewed at request of pharmacy due to low Phos and low normal K.   Pt extubated post-op, but remains NPO with NGT to LIWS, NGT output 2150 ml/24 hrs.  Day 3 of central PN. Pt does appear to be showing some signs of refeeding. Will increase macros cautiously. Noted K and Phos replacements given by primary team. Will need to increase the total volume to fit more ingredients. Will then increase sodium to keep LR concentration.  Based on AM labs will increase K and Phos and shift QM:VHQIONG to provide more Cl.    PN Changes (to start at 20:30):  Increasing volume from 1560 to 1680 ml  Increasing dextrose from 190 to 210 gm  Increasing Na from 203 to 218 mEq  Increasing K from 50 to 70 mEq  Increasing Phos from 22 to 30 mEq  Shifting EX:[id number] from 3:1 to Max Cl  Increasing SMOF lipids from 250 to 315 ml (goal)    PN Order (to start at 20:30):  Nonstandard TPN: 70 ml/hr x 24hr (1680 ml total volume)    Macronutrients: AA  130g (goal), Dex 210 g (goal=280 g), SMOFlipids 315 ml (goal)    Lytes: Na 218 mEq, K 70 mEq, Ca 10 mEq, Mg 16 mEq, Phos 30 mmol, Max Cl  Additives: 10 ml multivitamin, 1ml trace elements, and 100mg  thiamine    PN to provide 1864 kcals (22 kcals/kg; 89% estimated needs), 130 g AA (1.56 g/kg, 100% estimated needs)    On weekends/holidays, no action needs to be taken, if no changes are desired.  For PN changes/questions, NSS is available on Voalte or by pager [phone] for assistance.     Blake Living MS, RD, LD, CNSC  Available on [facility] Me   Office [phone]

## 2023-10-15 NOTE — Progress Notes
 END OF SHIFT SUMMARY [facility]    Admission Date: 10/10/2023  Length of Stay: LOS: 5 days        Acute events and nursing interventions:   Pt pain remains uncontrolled.     Communication with providers (include name/title):  1239: SICU team notified of change in pt's tele rhythm with coordinating increase in o2 requirements to meet goal Spo2.     Did patient sleep overnight? No      Patient demeanor and cognition:   Pt reports uncontrolled pain throughout duration of this shift despite multiple pain regimen changes. Pt cooperative with care.       Pain  Was the patient's pain controlled this shift? No    If no, what new interventions were implemented?        Morphine PCA re-initiated, tylenol frequency increased, robaxin scheduled not PRN and frequency of administration increased, enema to promote BM.      Is the patient intubated? No      Patient Education  Patient education provided: Fall bundle and prevention, Mobility goals, Pain management, and Skin integrity and wound care  Other patient education provided:  Learners: Patient  Method/materials used: Verbal teaching  Response to learning: Some Evidence of Learning, Needs Reinforcement    Patient Goal(s):  Patient will Report pain is controlled/improved by discharge date

## 2023-10-16 ENCOUNTER — Inpatient Hospital Stay: Admit: 2023-10-16 | Discharge: 2023-10-16 | Payer: MEDICARE

## 2023-10-16 DIAGNOSIS — R109 Unspecified abdominal pain: Secondary | ICD-10-CM

## 2023-10-16 LAB — PTT (APTT)
~~LOC~~ BKR PTT: 48 s — ABNORMAL HIGH (ref 24.0–36.5)
~~LOC~~ BKR PTT: 50 s — ABNORMAL HIGH (ref 24.0–36.5)

## 2023-10-16 MED ORDER — FUROSEMIDE 10 MG/ML IJ SOLN
40 mg | INTRAVENOUS | 0 refills | Status: CP
Start: 2023-10-16 — End: ?
  Administered 2023-10-16 – 2023-10-17 (×3): 40 mg via INTRAVENOUS

## 2023-10-16 MED ORDER — LISINOPRIL 20 MG PO TAB
20 mg | Freq: Two times a day (BID) | ORAL | 0 refills | Status: DC
Start: 2023-10-16 — End: 2023-10-19
  Administered 2023-10-16 – 2023-10-17 (×2): 20 mg via ORAL

## 2023-10-16 MED ORDER — MAGNESIUM SULFATE IN WATER 4 GRAM/50 ML (8 %) IV PGBK
4 g | Freq: Once | INTRAVENOUS | 0 refills | Status: CP
Start: 2023-10-16 — End: ?
  Administered 2023-10-16: 12:00:00 4 g via INTRAVENOUS

## 2023-10-16 MED ORDER — ADULT PN CONTINUOUS-ION BASED
INTRAVENOUS | 0 refills | Status: CP
Start: 2023-10-16 — End: ?
  Administered 2023-10-17: 03:00:00 1680.0000 mL via INTRAVENOUS

## 2023-10-16 MED ORDER — ENOXAPARIN 120 MG/0.8 ML SC SYRG
1 mg/kg | Freq: Two times a day (BID) | SUBCUTANEOUS | 0 refills | Status: DC
Start: 2023-10-16 — End: 2023-10-19
  Administered 2023-10-16 – 2023-10-18 (×5): 110 mg via SUBCUTANEOUS

## 2023-10-16 MED ORDER — IMS MIXTURE TEMPLATE
50 meq | Freq: Once | ORAL | 0 refills | Status: CP
Start: 2023-10-16 — End: ?
  Administered 2023-10-16 (×2): 50 meq via ORAL

## 2023-10-16 MED ORDER — SENNOSIDES-DOCUSATE SODIUM 8.6-50 MG PO TAB
2 | Freq: Every day | NASOGASTRIC | 0 refills | Status: DC
Start: 2023-10-16 — End: 2023-10-19
  Administered 2023-10-16: 15:00:00 2 via NASOGASTRIC

## 2023-10-16 MED ORDER — POLYETHYLENE GLYCOL 3350 17 GRAM PO PWPK
1 | Freq: Two times a day (BID) | NASOGASTRIC | 0 refills | Status: DC
Start: 2023-10-16 — End: 2023-10-19
  Administered 2023-10-16 – 2023-10-17 (×2): 17 g via NASOGASTRIC

## 2023-10-16 NOTE — Progress Notes
 Surgical Critical Care Progress Note    Today's Date: 10/16/2023   Hospital Day: Hospital Day: 7    History of Present Illness:   Blake Decker is a 73 y.o. 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, SMA stenosis s/p ex lap, SBR, in discontinuity, abthera placement (1/13) s/p angio SMA, hepatic artery, celiac stent  (1/14) s/p ex lap, primary anastomosis, abdominal wall closure (1/16)    Overnight 1/19: Systolic goal < 180. cardene gtt, esmolol off. 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    Arm pain, left 10/04/2018    Acute respiratory failure with hypoxia (HCC) 09/25/2018    Severe sepsis (HCC) 09/25/2018    Olecranon bursitis of left elbow 09/25/2018    Cellulitis 09/23/2018    Atrial fibrillation with RVR (HCC) 09/23/2018    Coronary artery disease involving native coronary artery 09/23/2018    COPD (chronic obstructive pulmonary disease) (HCC) 09/23/2018    OSA (obstructive sleep apnea) 09/23/2018       Assessment & Plan:   Neuro   Acute pain due to trauma  Multimodal pain regimen:  -Tylenol IV 1000mg  q6h prn   -Robaxin 750 mg q8h prn   -Lidocaine topical patch daily   -morphine PCA  -Oxy 10 mg TID. Oxy 5-15 mg q4h prn   -TAPPS block per APS 1/16    Sedation  -none    GCS 15 (E4,V5,M6)    CV   Undifferentiated shock versus hypovolemic versus septic, resolved, HTN, Afib (Xarelto), CAD s/p PCI w/ stent (2014), dilated cardiomyopathy, angio SMA, hepatic artery, celiac stent (1/14)  -Vital signs stable, A fib rate controlled, continue to monitor. MAP > 65 SBP < 180. Off pressors. On cardene rate 5   -Amlodipine 10 mg. Hydralazine 10 mg q3h prn. Wean off cardene gtt today as tolerated. Restarting PTA lisinopril   -PTA xarelto held. ASA/statin resumed. PTA Metoprolol tartrate 50 BID. Digoxin 125 mcg  -Transition hep gtt to therapeutic lovenox to help with fluid balance   - Mesenteric stents placed per vasc (1/14). Okay for AC, PO ASA. No need for Plavix at discharge  - Per vasc okay for xarelto and ASA at discharge     Pulm  Acute hypoxic respiratory failure, COPD, OSA, tobaccoism   On 4-6L NC. CXR reviewed. Additional diuresis today    GI/ Fluids / Electrolytes / Nutrition  Risk for constipation, Risk for electrolyte imbalance, chronic mesenteric ischemia, SMA stenosis s/p ex lap, SBR, in discontinuity ,abthera placement (1/13) s/p ex lap, primary anastomosis, abdominal wall closure (1/16)  -NPO okay to use NG for meds. In continuity. Protonix 40 mg daily. NGT 1.6 L/24hrs, bilious  -Home lasix 40 mg every morning currently held. 40 lasix IV q8h today, goal net negative  -Bowel regimen: senna, miralax   -TPN running, continue   -Zofran prn nausea    GU    -Cr  0.54 (  0.65). UOP 3725  -Maintain foley     Heme/ID  Acute blood loss anemia  - Hgb 13.5 (13.2). No clinical signs of overt bleeding. Continue to trend serially. Optimize fluid balance. Monitor stools for signs of occult GI bleeding.    Leukocytosis  -WBC 29 (22),  Afebrile. Given only 3 days out from closure, and benign abominal exam, no imaging at this time, CXR with right basilar opacity, will discuss role of antibiotics  -Cultures 1/13   Urine, no growth. Fecal rotavirus, cryptosporidium, cdiff negative     Endo  -No acute issues. Monitor and treat prn. Controlled without use of subcutaneous insulin    MS  Impaired mobility and activities of daily living  -PT/OT  -NWB LLE (fibular fracture repaired PTA), discussed with ortho on call     Daily Quality Checklist  Lines:  3 Peripheral IV   R IJ central line   Foley Cath  NGT  Activity progressive mobility but NWB LLE   GI Prophylaxis: protonix  DVT Prophylaxis: hep gtt, transition to therapeutic lovenox today  Nutrition: NPO, NG ok for meds   Bowel Regimen: miralax, senna   Lab Frequency: Daily   Daily CXR: No  Disposition: SICU    Subjective    Some pain present this morning, no nausea. Unsure if he's passing gas. No BM. Wanting to get out of bed.     Objective:    Physical Exam:  General: alert, oriented, responsive   HEENT: Normocephalic, atraumatic; mucus membranes moist  Lungs: on nasal canula 5-6L, normal effort of respiration   Heart: A fib, rate controlled  Abdomen:  Soft, tender on deep palpation, incision with staples C/D/I  Extremities:  No edema; Warm, without cyanosis, LLE in boot from fibula fracture (surgery 12/31)    Labs:    Complete Blood Counts   Recent Labs     10/13/23  1224 10/14/23  0003 10/15/23  0359 10/16/23  0323   HGB 12.9* 13.5 13.2* 13.5   HCT 37.9* 40.9 40.6 40.2   WBC 8.70 13.00* 22.10* 29.80*   PLTCT 300 354 455* 454*   MCV 84.0 84.1 84.3 85.6   MCH 28.5 27.9 27.5 28.6   MCHC 33.9 33.1 32.6 33.5   RDW 14.8 14.7 14.5 14.6   MPV 7.8 7.7 8.0 8.0     No results for input(s): NEUT, LYMPH, MONO, POIK, OVAL, PLTEST in the last 72 hours.    Invalid input(s): EOSINOPHIL       Chemistry Panel   Recent Labs     10/13/23  1224 10/14/23  0003 10/14/23  1443 10/15/23  0359 10/15/23  1422 10/16/23  0323   NA 133* 132* 137 137 137 141   K 3.7 4.0 3.9 3.5 3.7 3.5   CL 95* 95* 95* 97* 97* 104   CO2 28 28 31* 30 30 30    BUN 23 16 24  29* 30* 32*   CR 0.97 0.82 0.77 0.65 0.59 0.57   GLU 122* 150* 145* 148* 221* 148*   GAP 10 9 11 10 10 7    GFR >60 >60 >60 >60 >60 >60   MG 1.8 2.3  --  2.0  --  1.7   CA 8.6 8.9 9.3 9.4 9.2 9.4   PO4 4.5 2.3  --  1.8*  --  2.2     Recent Labs     10/13/23  1224   ALKPHOS 82   AST 7   ALT 6*   TOTPROT  6.0   TOTBILI 0.9   ALBUMIN 3.0*        Coagulation Studies   Recent Labs     10/13/23  1224 10/13/23  2039 10/15/23  0359 10/15/23  1249 10/15/23  1809 10/16/23  0323   PTT 27.7   < > 42.4* 56.1* 48.9* 50.0*   INR 1.3*  --   --   --   --   --     < > = values in this interval not displayed.          Vital Signs (24 hours)  BP: (125-185)/(57-91)   Temp:  [36.4 ?C (97.5 ?F)-37.5 ?C (99.5 ?F)]   Pulse:  [57-109]   Respirations:  [13 PER MINUTE-31 PER MINUTE]   SpO2:  [85 %-95 %]   O2 Device: High flow nasal cannula  O2 Liter Flow: 6 Lpm    Medications  Scheduled Meds:acetaminophen (OFIRMEV) 1,000 mg injection 100 mL, 1,000 mg, Intravenous, Q6H*  amitriptyline/gabapentin/baclofen(#) 2/6/2 % topical cream, , Topical, Q8H  amLODIPine (NORVASC) tablet 10 mg, 10 mg, Feeding Tube, QDAY  aspirin chewable tablet 81 mg, 81 mg, Feeding Tube, QDAY  atorvastatin (LIPITOR) tablet 40 mg, 40 mg, Feeding Tube, QDAY  bisacodyL (DULCOLAX) rectal suppository 10 mg, 10 mg, Rectal, BID  busPIRone (BUSPAR) tablet 15 mg, 15 mg, Feeding Tube, TID  digoxin (LANOXIN) tablet 125 mcg, 125 mcg, Feeding Tube, QDAY  lidocaine (LIDODERM) 5 % topical patch 2 patch, 2 patch, Topical, QDAY  magnesium sulfate   4 g/50 mL IVPB, 4 g, Intravenous, ONCE  methocarbamoL (ROBAXIN) tablet 750 mg, 750 mg, Feeding Tube, Q6H  metoprolol tartrate (LOPRESSOR) tablet 50 mg, 50 mg, Oral, BID  oxyCODONE (ROXICODONE) tablet 10 mg, 10 mg, Feeding Tube, TID  pantoprazole (PROTONIX) injection 40 mg, 40 mg, Intravenous, QDAY  polyethylene glycol 3350 (MIRALAX) packet 17 g, 1 packet, Per NG tube, BID  pregabalin (LYRICA) capsule 200 mg, 200 mg, Feeding Tube, TID  rOPINIRole (REQUIP) tablet 1 mg, 1 mg, Oral, QHS  sennosides-docusate sodium (SENOKOT-S) tablet 2 tablet, 2 tablet, Per NG tube, QDAY  venlafaxine (EFFEXOR) tablet 75 mg, 75 mg, Per Corpak Tube, BID    Continuous Infusions:   Adult Continuous Parenteral Nutrition (PN) 70 mL/hr at 10/15/23 2026 heparin (porcine) 20,000 units/D5W 500 mL infusion (std conc)(premade) 2,200 Units/hr (10/16/23 0543)    morphine PCA 50 mg/50 mL infusion syr (std conc)(premade)      niCARdipine (cardENE) 20 mg/200 mL NS IV drip (std conc)(premade) 5 mg/hr (10/16/23 0626)     PRN and Respiratory Meds:albuterol-ipratropium Q4H PRN, hydrALAZINE Q3H PRN, labetalol (NORMODYNE; TRANDATE) injection Q4H PRN, nalOXone PRN, ondansetron Q6H PRN **OR** ondansetron (ZOFRAN) IV Q6H PRN, oxyCODONE Q4H PRN, traZODone QHS PRN      I have personally reviewed pertinent labs, medications, radiology, and diagnostic procedures including: active problem list, medication list, allergies, family history, social history, health maintenance, notes from last encounter, lab results, imaging.     Lyda Kalata, DO  Electronically Signed  10/16/2023 6:39 AM  Available on [employer]ate

## 2023-10-16 NOTE — Progress Notes
 Nutrition Support Service (NSS) On-Call Note    Recommendations:   If able to safely able to advance to PO diet, REC consistency appropriate for safe swallow with no dietary related restrictions to optimize po intake.    If unable to tolerate PO diet and NGT output decreases, REC start volume based feeds of Nutren 1.5 at 20 ml/hr, with goal of 55 ml/hr + 2 prosource protein/day (at goal to provide  2140 kcals, 129g protein and 1003 ml free water.   (24-hr goal volume: 1320 ml; 4-hr goal volume: 220 ml); Additional fluids per primary team with minimum of 30 ml water q4 hrs for tube patency.    Could with discontinue PN once tolerating PO or EN consistently meeting ~65% of estimated nutrition needs.    Diet Order: NPO  TF: n/a    Electrolyte Treatments:   (1/17) KCl 10 mEq IV, NaPhos 30 mmol IV  (1/18) KPhos 2 tab and 30 mEq PO KCl via NGT  (1/19) IV Mg 4 g and 30 mEq PO KCl via NGT so far today    Admit Weight (Kg): Weight: 117.9 kg (260 lb)   Current Weight (Kg): Weight: 109.7 kg (241 lb 13.5 oz)  TPN Dose Weight (Kg): 83.3 kg (desired wt at BMI=24.9)    Estimated Kcal Needs:2100 (25 kcal/kg)  Estimated Protein Needs:125-140g  (1.5-1.7 g/kg)    1/19: NSS reviewed patient with high alerts noted yesterday and not yet to goal macros. Pt extubated post-op 1/16, but remains NPO with NGT to LIWS, NGT output 1600 ml/24 hrs.  Day 4 of central PN. Pt does appear to be showing some signs of refeeding. Pt is at goal for AA and SMOF lipids; will continue with cautious dextrose advancement and supplemental thiamine. K+, Mg, and phos all sub-optimal, so will increase all within next PN. Do not replacement ordered for K+ and Mg outside of central PN. 40 mg IV lasix scheduled q8hrs.     PN Changes (to start at 20:30):  Increasing dextrose from 210 to 240 gm  Increasing K from 70 to 90 mEq  Increasing magnesium from 16 to 20 mEq  Increasing Phos from 30 to 38 mmol    PN Order (to start at 20:30):  Nonstandard TPN: 70 ml/hr x 24hr (1680 ml total volume)    Macronutrients: AA  130g (goal), Dex 240 g (goal=280 g), SMOFlipids 315 ml (goal)    Lytes: Na 218 mEq, K 90 mEq, Ca 10 mEq, Mg 20 mEq, Phos 38 mmol, Max Cl  Additives: 10 ml multivitamin, 1ml trace elements, and 100mg  thiamine    PN to provide 1864 kcals (22 kcals/kg; 89% estimated needs), 130 g AA (1.56 g/kg, 100% estimated needs)    On weekends/holidays, no action needs to be taken, if no changes are desired.  For PN changes/questions, NSS is available on Voalte or by pager [phone] for assistance.     Blake Cluster, MA, RD, LD, CNSC  Available on [facility]

## 2023-10-17 ENCOUNTER — Inpatient Hospital Stay: Admit: 2023-10-17 | Discharge: 2023-10-17 | Payer: MEDICARE

## 2023-10-17 ENCOUNTER — Inpatient Hospital Stay: Admit: 2023-10-17 | Discharge: 2023-10-18 | Payer: MEDICARE

## 2023-10-17 ENCOUNTER — Encounter: Admit: 2023-10-17 | Discharge: 2023-10-17 | Payer: MEDICARE

## 2023-10-17 LAB — ECG 12-LEAD
Q-T INTERVAL: 330 ms
QRS DURATION: 104 ms
QTC CALCULATION (BAZETT): 433 ms
R AXIS: -48 degrees
T AXIS: 101 degrees
VENTRICULAR RATE: 104 {beats}/min

## 2023-10-17 LAB — BLOOD GASES, ARTERIAL
~~LOC~~ BKR BASE EXCESS-ART: 8.1 mmol/L (ref 70–100)
~~LOC~~ BKR BICARB, ART(CAL): 31 mmol/L — ABNORMAL HIGH (ref 21.0–28.0)
~~LOC~~ BKR O2 SAT-ART: 93 % — ABNORMAL LOW (ref 95.0–99.0)
~~LOC~~ BKR PCO2-ART: 46 mmHg — ABNORMAL HIGH (ref 35–45)
~~LOC~~ BKR PH-ART: 7.4 — ABNORMAL HIGH (ref 7.35–7.45)
~~LOC~~ BKR PO2-ART: 69 mmHg — ABNORMAL LOW (ref 80–100)

## 2023-10-17 LAB — MANUAL DIFF
~~LOC~~ BKR ADJUSTED WBC FROM MANUAL DIFF: 24 10*3/uL
~~LOC~~ BKR LYMPHOCYTES - RELATIVE: 1 % — ABNORMAL LOW (ref 24–44)
~~LOC~~ BKR METAMYELOCYTES - RELATIVE: 2 %
~~LOC~~ BKR MONOCYTES - RELATIVE: 13 % — ABNORMAL HIGH (ref 4–12)
~~LOC~~ BKR NEUT+BANDS - ABSOLUTE: 20 10*3/uL — ABNORMAL HIGH (ref 1.8–7.0)
~~LOC~~ BKR NEUTROPHILS - RELATIVE: 84 % — ABNORMAL HIGH (ref 41–77)
~~LOC~~ BKR NUCLEATED RBC MANUAL: 19 10*3/uL

## 2023-10-17 LAB — TRIGLYCERIDE: ~~LOC~~ BKR TRIGLYCERIDES: 67 mg/dL (ref <150)

## 2023-10-17 LAB — LACTIC ACID(LACTATE): ~~LOC~~ BKR LACTIC ACID: 1.7 mmol/L (ref 0.5–2.0)

## 2023-10-17 MED ORDER — POTASSIUM CHLORIDE IN WATER 10 MEQ/50 ML IV PGBK
10 meq | INTRAVENOUS | 0 refills | Status: CP
Start: 2023-10-17 — End: ?
  Administered 2023-10-17: 11:00:00 10 meq via INTRAVENOUS

## 2023-10-17 MED ORDER — SODIUM CHLORIDE 0.9 % IJ SOLN
50 mL | Freq: Once | INTRAVENOUS | 0 refills | Status: CP
Start: 2023-10-17 — End: ?
  Administered 2023-10-17: 19:00:00 50 mL via INTRAVENOUS

## 2023-10-17 MED ORDER — DIATRIZOATE MEG-DIATRIZOAT SOD 66-10 % PO SOLN
30 mL | Freq: Once | ORAL | 0 refills | Status: CP
Start: 2023-10-17 — End: ?
  Administered 2023-10-17: 18:00:00 30 mL via ORAL

## 2023-10-17 MED ORDER — POTASSIUM CHLORIDE IN WATER 10 MEQ/50 ML IV PGBK
10 meq | INTRAVENOUS | 0 refills | Status: CP
Start: 2023-10-17 — End: ?
  Administered 2023-10-17: 13:00:00 10 meq via INTRAVENOUS

## 2023-10-17 MED ORDER — ADULT PN CONTINUOUS-ION BASED
INTRAVENOUS | 0 refills | Status: CP
Start: 2023-10-17 — End: ?
  Administered 2023-10-18: 03:00:00 2160.0000 mL via INTRAVENOUS

## 2023-10-17 MED ORDER — VANCOMYCIN 2,000 MG IVPB
2000 mg | Freq: Once | INTRAVENOUS | 0 refills | Status: DC
Start: 2023-10-17 — End: 2023-10-18
  Administered 2023-10-17 (×2): 2000 mg via INTRAVENOUS

## 2023-10-17 MED ORDER — METOPROLOL TARTRATE 5 MG/5 ML IV SOLN
5 mg | INTRAVENOUS | 0 refills | Status: DC
Start: 2023-10-17 — End: 2023-10-19
  Administered 2023-10-17 – 2023-10-19 (×7): 5 mg via INTRAVENOUS

## 2023-10-17 MED ORDER — POTASSIUM CHLORIDE IN WATER 10 MEQ/50 ML IV PGBK
10 meq | INTRAVENOUS | 0 refills | Status: CP
Start: 2023-10-17 — End: ?
  Administered 2023-10-17: 12:00:00 10 meq via INTRAVENOUS

## 2023-10-17 MED ORDER — CEFTRIAXONE INJ 2GM IVP
2 g | INTRAVENOUS | 0 refills | Status: CP
Start: 2023-10-17 — End: ?
  Administered 2023-10-18 – 2023-10-26 (×9): 2 g via INTRAVENOUS

## 2023-10-17 MED ORDER — METOPROLOL TARTRATE 5 MG/5 ML IV SOLN
5 mg | INTRAVENOUS | 0 refills | Status: DC
Start: 2023-10-17 — End: 2023-10-17

## 2023-10-17 MED ORDER — IOHEXOL 350 MG IODINE/ML IV SOLN
100 mL | Freq: Once | INTRAVENOUS | 0 refills | Status: CP
Start: 2023-10-17 — End: ?
  Administered 2023-10-17: 19:00:00 100 mL via INTRAVENOUS

## 2023-10-17 MED ORDER — VANCOMYCIN 1,750 MG IVPB
15 mg/kg | Freq: Two times a day (BID) | INTRAVENOUS | 0 refills | Status: DC
Start: 2023-10-17 — End: 2023-10-18

## 2023-10-17 MED ORDER — METHOCARBAMOL 100 MG/ML IJ SOLN
1000 mg | INTRAVENOUS | 0 refills | Status: DC | PRN
Start: 2023-10-17 — End: 2023-10-22
  Administered 2023-10-18 – 2023-10-22 (×7): 1000 mg via INTRAVENOUS

## 2023-10-17 MED ORDER — POTASSIUM CHLORIDE IN WATER 10 MEQ/50 ML IV PGBK
10 meq | INTRAVENOUS | 0 refills | Status: CP
Start: 2023-10-17 — End: ?
  Administered 2023-10-17: 15:00:00 10 meq via INTRAVENOUS

## 2023-10-17 MED ORDER — POTASSIUM CHLORIDE IN WATER 10 MEQ/50 ML IV PGBK
10 meq | INTRAVENOUS | 0 refills | Status: CP
Start: 2023-10-17 — End: ?
  Administered 2023-10-17: 14:00:00 10 meq via INTRAVENOUS

## 2023-10-17 MED ORDER — CEFAZOLIN 2 GRAM IV SOLR
2 g | INTRAVENOUS | 0 refills | Status: DC
Start: 2023-10-17 — End: 2023-10-18

## 2023-10-17 MED ORDER — SODIUM PHOSPHATE IVPB
15 MMOL | Freq: Once | INTRAVENOUS | 0 refills | Status: CP
Start: 2023-10-17 — End: ?
  Administered 2023-10-17 (×2): 15 mmol via INTRAVENOUS

## 2023-10-17 MED ORDER — VANCOMYCIN PHARMACY TO MANAGE
1 | 0 refills | Status: DC
Start: 2023-10-17 — End: 2023-10-18

## 2023-10-17 NOTE — Procedures
 Vascular Access Team consulted to obtain lab specimen.    Ultrasound Used: Yes    How Many Attempts: 1 for each [facility]    Location of Unsuccessful Attempts: n/a    At 1413 approximately 40 ml of blood obtained from R AC medial and R AC lateral. Patient tolerated the procedure well. Specimen labeled and sent to lab

## 2023-10-17 NOTE — Progress Notes
 Surgical Critical Care Progress Note    Today's Date: 10/17/2023   Hospital Day: Hospital Day: 8    History of Present Illness:   Alice Vitelli is a 73 y.o. 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, SMA stenosis s/p ex lap, SBR, in discontinuity, abthera placement (1/13) s/p angio SMA, hepatic artery, celiac stent  (1/14) s/p ex lap, primary anastomosis, abdominal wall closure (1/16)    Overnight 1/19: tachycardic with rates up to 130's overnight    Patient Active Problem List    Diagnosis Date Noted    Leukocytosis, unspecified 10/14/2023    S/P small bowel resection 10/12/2023    Moderate malnutrition (HCC) 10/11/2023    Abdominal pain, generalized 10/11/2023    Diarrhea 10/11/2023    Mesenteric ischemia (HCC) 10/11/2023    Atrial fibrillation (HCC) 10/11/2023    Acute blood loss anemia 10/11/2023    Severe sepsis with septic shock (HCC) 10/11/2023    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    Arm pain, left 10/04/2018    Acute respiratory failure with hypoxia (HCC) 09/25/2018    Severe sepsis (HCC) 09/25/2018    Olecranon bursitis of left elbow 09/25/2018    Cellulitis 09/23/2018    Atrial fibrillation with RVR (HCC) 09/23/2018    Coronary artery disease involving native coronary artery 09/23/2018    COPD (chronic obstructive pulmonary disease) (HCC) 09/23/2018    OSA (obstructive sleep apnea) 09/23/2018       Assessment & Plan:   Neuro   Acute pain due to trauma  Multimodal pain regimen:  -Tylenol IV 1000mg  q6h prn   -Robaxin 750 mg q8h prn   -Lidocaine topical patch daily   -morphine PCA  -Oxy 10 mg TID. Oxy 5-15 mg q4h prn   -TAPPS block per APS 1/16    Anxiety  - PTA venlafaxine and buspar resumed ok to give even with NPO status    GCS 15 (E4,V5,M6)    CV   Undifferentiated shock versus hypovolemic versus septic, resolved, HTN, Afib (Xarelto), CAD s/p PCI w/ stent (2014), dilated cardiomyopathy, angio SMA, hepatic artery, celiac stent (1/14)  - Vital signs stable, A fib, continue to monitor. MAP > 65 SBP < 180.   - PTA antihypertensives held due to ileus   - PTA xarelto held.   - ASA/statin resumed  - PTA Metoprolol tartrate 50 BID held due to NPO status   - started metop IV 5mg  q6h  - Digoxin 125 mcg held due to NPO status  -Therapeutic lovenox  - Mesenteric stents placed per vasc (1/14). Okay for AC, PO ASA. No need for Plavix at discharge  - Per vasc okay for xarelto and ASA at discharge   - Obtaining CTA chest to evaluate for possible PE due to tachycardia     Pulm  Acute hypoxic respiratory failure,   Hx of COPD, OSA, tobaccoism   - currently on 1L HFNC    GI/ Fluids / Electrolytes / Nutrition  Risk for constipation, Risk for electrolyte imbalance, chronic mesenteric ischemia, SMA stenosis s/p ex lap, SBR, in discontinuity ,abthera placement (1/13) s/p ex lap, primary anastomosis, abdominal wall closure (1/16)  - NPO okay to use NG for meds. In continuity. Protonix 40 mg daily. NGT 1.6 L/24hrs, gastric contents  - Home lasix 40 mg every morning currently held  - Bowel regimen: suppositories BID  - TPN running, continue   - Zofran  prn nausea  - Obtaining CT abdomen and pelvis to evaluate if tachycardia is secondary to possible anastomotic leak    GU    - Cr  0.60,  UOP 4325,  1.63ml/kg/hr  - Fluid status 24 hours: - Since Admit: +94  - Maintain foley     Heme/ID  Acute blood loss anemia  - Hgb stable. No clinical signs of overt bleeding. Continue to trend serially. Optimize fluid balance. Monitor stools for signs of occult GI bleeding.    Leukocytosis  - WBC 24 (29),  Afebrile.   - Cultures 1/13   Urine, no growth. Fecal rotavirus, cryptosporidium, cdiff negative   - Obtaining repeat blood cultures today 1/20    Endo  -No acute issues. Monitor and treat prn. Controlled without use of subcutaneous insulin    MS  Impaired mobility and activities of daily living  -PT/OT  -NWB LLE (fibular fracture repaired PTA), discussed with ortho on call     Daily Quality Checklist  Lines:  3 Peripheral IV   R IJ central line   Foley Cath  NGT  Activity progressive mobility but NWB LLE   GI Prophylaxis: protonix  DVT Prophylaxis: hep gtt, transition to therapeutic lovenox today  Nutrition: NPO, NG ok for meds   Bowel Regimen: miralax, senna   Lab Frequency: Daily   Daily CXR: No  Disposition: SICU    Subjective    Pain well controlled, requesting to drink pop, wants to leave hospital    Objective:    Physical Exam:  General: alert, oriented, responsive   HEENT: Normocephalic, atraumatic; mucus membranes moist, NGT in place with gastric contents  Lungs: on oxymask, normal effort of respiration   Heart: A fib, rate controlled  Abdomen:  Soft, tender on deep palpation, incision with staples C/D/I  Extremities:  No edema; Warm, without cyanosis, LLE in boot from fibula fracture (surgery 12/31)    Labs:    Complete Blood Counts   Recent Labs     10/15/23  0359 10/16/23  0323 10/17/23  0159   HGB 13.2* 13.5 13.4*   HCT 40.6 40.2 41.6   WBC 22.10* 29.80*  --    PLTCT 455* 454* 455*   MCV 84.3 85.6 85.1   MCH 27.5 28.6 27.4   MCHC 32.6 33.5 32.2   RDW 14.5 14.6 15.2*   MPV 8.0 8.0 8.2     Recent Labs     10/17/23  0159   LYMPH 1*   MONO 13*   PLTEST Slight Increase          Chemistry Panel   Recent Labs     10/15/23  0359 10/15/23  1422 10/16/23  0323 10/17/23  0159   NA 137 137 141 148*   K 3.5 3.7 3.5 3.2*   CL 97* 97* 104 102   CO2 30 30 30  32*   BUN 29* 30* 32* 34*   CR 0.65 0.59 0.57 0.60   GLU 148* 221* 148* 123*   GAP 10 10 7  14*   GFR >60 >60 >60 >60   MG 2.0  --  1.7 1.9   CA 9.4 9.2 9.4 9.5   PO4 1.8*  --  2.2  --      No results for input(s): ALKPHOS, AST, ALT, TOTPROT, TOTBILI, ALBUMIN, AMYLASE, LIPASE in the last 72 hours.       Coagulation Studies   Recent Labs     10/15/23  0359 10/15/23  1249 10/15/23  1809 10/16/23  0323   PTT 42.4* 56.1* 48.9* 50.0*          Vital Signs (24 hours)  BP: (149-185)/(67-105)   Temp:  [36.4 ?C (97.6 ?F)-36.8 ?C (98.3 ?F)]   Pulse:  [91-120]   Respirations:  [14 PER MINUTE-33 PER MINUTE]   SpO2:  [84 %-95 %]   O2 Device: Oxymask  O2 Liter Flow: 4 Lpm    Medications  Scheduled Meds:acetaminophen (OFIRMEV) 1,000 mg injection 100 mL, 1,000 mg, Intravenous, Q6H*  amitriptyline/gabapentin/baclofen(#) 2/6/2 % topical cream, , Topical, Q8H  [Held by Provider] amLODIPine (NORVASC) tablet 10 mg, 10 mg, Feeding Tube, QDAY  aspirin chewable tablet 81 mg, 81 mg, Feeding Tube, QDAY  atorvastatin (LIPITOR) tablet 40 mg, 40 mg, Feeding Tube, QDAY  bisacodyL (DULCOLAX) rectal suppository 10 mg, 10 mg, Rectal, BID  [Held by Provider] busPIRone (BUSPAR) tablet 15 mg, 15 mg, Feeding Tube, TID  [Held by Provider] digoxin (LANOXIN) tablet 125 mcg, 125 mcg, Feeding Tube, QDAY  enoxaparin (LOVENOX) syringe 110 mg, 1 mg/kg, Subcutaneous, BID  lidocaine (LIDODERM) 5 % topical patch 2 patch, 2 patch, Topical, QDAY  [Held by Provider] lisinopriL (ZESTRIL) tablet 20 mg, 20 mg, Oral, BID  metoprolol (LOPRESSOR) injection 5 mg, 5 mg, Intravenous, Q6H*  [Held by Provider] oxyCODONE (ROXICODONE) tablet 10 mg, 10 mg, Feeding Tube, TID  pantoprazole (PROTONIX) injection 40 mg, 40 mg, Intravenous, QDAY  [Held by Provider] polyethylene glycol 3350 (MIRALAX) packet 17 g, 1 packet, Per NG tube, BID  potassium chloride in water IVPB 10 mEq, 10 mEq, Intravenous, Q30 MIN   Followed by  potassium chloride in water IVPB 10 mEq, 10 mEq, Intravenous, Q30 MIN   Followed by  potassium chloride in water IVPB 10 mEq, 10 mEq, Intravenous, Q30 MIN   Followed by  potassium chloride in water IVPB 10 mEq, 10 mEq, Intravenous, Q30 MIN  [Held by Provider] pregabalin (LYRICA) capsule 200 mg, 200 mg, Feeding Tube, TID  [Held by Provider] rOPINIRole (REQUIP) tablet 1 mg, 1 mg, Oral, QHS  [Held by Provider] sennosides-docusate sodium (SENOKOT-S) tablet 2 tablet, 2 tablet, Per NG tube, QDAY  sodium phosphate 15 mmol in dextrose 5% (D5) 250 mL IVPB, 15 mmol, Intravenous, ONCE  [Held by Provider] venlafaxine (EFFEXOR) tablet 75 mg, 75 mg, Per Corpak Tube, BID    Continuous Infusions:   Adult Continuous Parenteral Nutrition (PN) 70 mL/hr at 10/16/23 2037    morphine PCA 50 mg/50 mL infusion syr (std conc)(premade)       PRN and Respiratory Meds:albuterol-ipratropium Q4H PRN, hydrALAZINE Q3H PRN, labetalol (NORMODYNE; TRANDATE) injection Q4H PRN, methocarbamoL Q8H PRN, nalOXone PRN, ondansetron Q6H PRN **OR** ondansetron (ZOFRAN) IV Q6H PRN, [Held by Provider] oxyCODONE Q4H PRN, [Held by Provider] traZODone QHS PRN      I have personally reviewed pertinent labs, medications, radiology, and diagnostic procedures including: active problem list, medication list, allergies, family history, social history, health maintenance, notes from last encounter, lab results, imaging.     Lanae Boast, MD  Electronically Signed  10/17/2023 7:21 AM  Available on [employer]

## 2023-10-17 NOTE — Procedures
VAT consulted for lab draw. Primary nursing staff to consult lab/lab troubleshoot and re-consult VAT if lab is unsuccessful.

## 2023-10-17 NOTE — Progress Notes
 0530: Pts monitor alarms for tachycardia. This RN to the bedside where patient is sitting up on the side of the bed. A&Ox4. Pt reports feeling anxious and gets increasingly tachycardic and tachypneic. Pts O2 sats maintaining in the mid 80's. Oxygen titrated up. SICU team notified.     0540: SICU team to bedside. EKG and chest xray obtained. Medications ordered and administered. See MAR for details.Oxygen delivery device swapped for oxymask - pt sats increase to low 90's.     0550: Pt resting in bed, wife called and updated, bed alarms on

## 2023-10-17 NOTE — Progress Notes
 END OF SHIFT SUMMARY [facility]    Admission Date: 10/10/2023  Length of Stay: LOS: 7 days        Acute events and nursing interventions: Pt taken to CT for evaluation of bowel anastomosis and to R/O PE d/t increased O2 requirements and tachycardia. Pt reporting increased anxiety, concern discussed with below providers and PTA medications restarted.       Communication with providers (include name/title): Brooke Dare, MD; Hermelinda Medicus, MD      Did patient sleep overnight? N/A      Patient demeanor and cognition: Pt A&Ox4, calm and cooperative for most of shift. Intermittent episodes of restlessness, anxiety, and irritability, pt redirectable.       Pain  Was the patient's pain controlled this shift? Yes  If no, what new interventions were implemented?            Is the patient intubated? No      Patient Education  Patient education provided: Fall bundle and prevention, Mobility goals, and Pain management  Other patient education provided:  Learners: Patient and Significant Other  Method/materials used: Verbal teaching and Demonstration  Response to learning: Verbalizes Understanding    Patient Goal(s):  Patient will Achieve timely healing, Be able to ambulate safely, and Report progressive increase in activity tolerance  by discharge date

## 2023-10-17 NOTE — Progress Notes
 Pharmacy Vancomycin Note  Subjective:   Blake Decker is a 73 y.o. male being treated for PNA.    Assessment:   Target levels for this patient:  1.  AUC (mcg*h/mL):  400-600  2.   Trough 10-20    Plan:   Will give vanc 2000mg  loading dose followed by 1750mg  every 12 hours with predicted AUC 430 and trough 11  Next scheduled level(s): plan levels at steady state  Pharmacy will continue to monitor and adjust therapy as needed.    Objective:     Current Vancomycin Orders   Medication Dose Route Frequency    [START ON 10/18/2023] vancomycin (VANCOCIN) 1,750 mg in sodium chloride 0.9% 285 mL IVPB  15 mg/kg Intravenous Q12H*    vancomycin (VANCOCIN) 2,000 mg in sodium chloride 0.9% 290 mL IVPB  2,000 mg Intravenous ONCE    vancomycin, pharmacy to manage  1 each Service Per Pharmacy     Start Date of  vancomycin therapy: 10/17/2023    White Blood Cells   Date/Time Value Ref Range Status   10/16/2023 0323 29.80 (H) 4.50 - 11.00 10*3/uL Final   10/15/2023 0359 22.10 (H) 4.50 - 11.00 10*3/uL Final     Creatinine   Date/Time Value Ref Range Status   10/17/2023 0159 0.60 0.40 - 1.24 mg/dL Final   52/77/8242 3536 0.57 0.40 - 1.24 mg/dL Final   14/43/1540 0867 0.59 0.40 - 1.24 mg/dL Final     Estimated CrCl: 14mL/min    Actual Weight:  109.7 kg (241 lb 13.5 oz)      Baron Hamper, [facility]  10/17/2023

## 2023-10-17 NOTE — Progress Notes
 Nutrition Support Service (NSS) On-Call Note    Recommendations:   If able to safely able to advance to PO diet, REC consistency appropriate for safe swallow with no dietary related restrictions to optimize po intake.    If unable to tolerate PO diet and NGT output decreases, REC start volume based feeds of Nutren 1.5 at 20 ml/hr, with goal of 55 ml/hr + 2 prosource protein/day (at goal to provide  2140 kcals, 129g protein and 1003 ml free water.   (24-hr goal volume: 1320 ml; 4-hr goal volume: 220 ml); Additional fluids per primary team with minimum of 30 ml water q4 hrs for tube patency.    Could with discontinue PN once tolerating PO or EN consistently meeting ~65% of estimated nutrition needs.    Diet Order: NPO  TF: n/a    Electrolyte Treatments:   (1/17) KCl 10 mEq IV, NaPhos 30 mmol IV  (1/18) KPhos 2 tab and 30 mEq PO KCl via NGT  (1/19) IV Mg 4 g and 50 mEq PO KCl via NGT   (1/20) 70 mEq IV KCl provided and 15 mmol IV NaPhos scheduled    Admit Weight (Kg): Weight: 117.9 kg (260 lb)   Current Weight (Kg): Weight: 109.7 kg (241 lb 13.5 oz)  TPN Dose Weight (Kg): 83.3 kg (desired wt at BMI=24.9)    Estimated Kcal Needs:2100 (25 kcal/kg)  Estimated Protein Needs:125-140g  (1.5-1.7 g/kg)    1/20: Chart/labs reviewed. Noted jump in Na+ that is now elevated; will increase PN rate/volume and decrease Na+ in next PN. Noted pt did receive 40 mg IV lasix x2 yesterday. Pt at goal for AA and lipids; will increase dextrose to goal and continue with supplemental thiamine. Will Increase K+, Mg, and phosphorus in next PN.      PN Changes (to start at 20:30):  Increasing PN rate from 70 to 90 ml/hr--total volume from 1680 to 2160 ml  Increasing dextrose from 240 to 280 gm  Decreasing Na+ from 218 to 83 mEq  Increasing K from 90 to 100 mEq  Increasing magnesium from 20 to 24 mEq  Increasing phosphorus from 38 to 43 mmol.     PN Order (to start at 20:30):  Nonstandard TPN: 90 ml/hr x 24hr (2160 ml total volume) Macronutrients: AA  130g (goal), Dex 280 g (goal), SMOFlipids 315 ml (goal)    Lytes: Na 83 mEq, K 100 mEq, Ca 10 mEq, Mg 24 mEq, Phos 43 mmol, Max Cl  Additives: 10 ml multivitamin, 1ml trace elements, and 100mg  thiamine    PN to provide 2102 kcals (25 kcals/kg; 100% estimated needs), 130 g AA (1.56 g/kg, 100% estimated needs)    On weekends/holidays, no action needs to be taken, if no changes are desired.  For PN changes/questions, NSS is available on Voalte or by pager [phone] for assistance.     Willette Cluster, MA, RD, LD, CNSC  Available on [facility]

## 2023-10-18 ENCOUNTER — Inpatient Hospital Stay: Admit: 2023-10-18 | Discharge: 2023-10-18 | Payer: MEDICARE

## 2023-10-18 ENCOUNTER — Encounter: Admit: 2023-10-18 | Discharge: 2023-10-18 | Payer: MEDICARE

## 2023-10-18 LAB — BASIC METABOLIC PANEL
~~LOC~~ BKR BLD UREA NITROGEN: 33 mg/dL — ABNORMAL HIGH (ref 7–25)
~~LOC~~ BKR CHLORIDE: 110 mmol/L — ABNORMAL HIGH (ref 98–110)
~~LOC~~ BKR CREATININE: 0.6 mg/dL — ABNORMAL HIGH (ref 0.40–1.24)
~~LOC~~ BKR GLUCOSE, RANDOM: 135 mg/dL — ABNORMAL HIGH (ref 70–100)
~~LOC~~ BKR POTASSIUM: 3.5 mmol/L — ABNORMAL HIGH (ref 3.5–5.1)
~~LOC~~ BKR SODIUM, SERUM: 151 mmol/L — ABNORMAL HIGH (ref 137–147)

## 2023-10-18 LAB — BLOOD GASES, ARTERIAL
~~LOC~~ BKR BASE DEFICIT-ART: 1.4 mmol/L
~~LOC~~ BKR BASE DEFICIT-ART: 2.2 mmol/L
~~LOC~~ BKR BASE EXCESS-ART: 2.1 mmol/L
~~LOC~~ BKR BASE EXCESS-ART: 0.9 mmol/L — ABNORMAL LOW (ref 40.0–50.0)
~~LOC~~ BKR BICARB, ART(CAL): 25 mmol/L (ref 21.0–28.0)
~~LOC~~ BKR BICARB, ART(CAL): 26 mmol/L (ref 21.0–28.0)
~~LOC~~ BKR BICARB, ART(CAL): 23 mmol/L (ref 21.0–28.0)
~~LOC~~ BKR BICARB, ART(CAL): 22 mmol/L — ABNORMAL LOW (ref 21.0–28.0)
~~LOC~~ BKR FIO2 VALUE-ART: 80 % — ABNORMAL LOW (ref 1.6–2.6)
~~LOC~~ BKR O2 SAT-ART: 95 % (ref 95.0–99.0)
~~LOC~~ BKR O2 SAT-ART: 98 % (ref 95.0–99.0)
~~LOC~~ BKR O2 SAT-ART: 98 % (ref 95.0–99.0)
~~LOC~~ BKR PCO2-ART: 54 mmHg — ABNORMAL HIGH (ref 35–45)
~~LOC~~ BKR PCO2-ART: 59 mmHg — ABNORMAL HIGH (ref 35–45)
~~LOC~~ BKR PCO2-ART: 49 mmHg — ABNORMAL HIGH (ref 35–45)
~~LOC~~ BKR PH-ART: 7.3 — ABNORMAL LOW (ref 7.35–7.45)
~~LOC~~ BKR PH-ART: 7.3 — ABNORMAL LOW (ref 7.35–7.45)
~~LOC~~ BKR PH-ART: 7.3 — ABNORMAL LOW (ref 7.35–7.45)
~~LOC~~ BKR PO2-ART: 137 mmHg — ABNORMAL HIGH (ref 80–100)
~~LOC~~ BKR PO2-ART: 97 mmHg (ref 80–100)
~~LOC~~ BKR PO2-ART: 185 mmHg — ABNORMAL HIGH (ref 80–100)

## 2023-10-18 LAB — GLUCOSE,BG
GLUCOSE BLOOD GAS: 173 mg/dL — ABNORMAL HIGH (ref 70–100)
GLUCOSE BLOOD GAS: 208 mg/dL — ABNORMAL HIGH (ref 70–100)
GLUCOSE BLOOD GAS: 231 mg/dL — ABNORMAL HIGH (ref 70–100)

## 2023-10-18 LAB — POTASSIUM, BG
POTASSIUM BLOOD GAS: 4 mmol/L (ref 3.5–5.1)
POTASSIUM BLOOD GAS: 4.1 mmol/L (ref 3.5–5.1)

## 2023-10-18 LAB — MRSA BY PCR (NASAL)

## 2023-10-18 LAB — LACTIC ACID(LACTATE): ~~LOC~~ BKR LACTIC ACID: 1.6 mmol/L (ref 0.5–2.0)

## 2023-10-18 LAB — CBC
~~LOC~~ BKR HEMOGLOBIN: 14 g/dL — ABNORMAL LOW (ref 13.5–16.5)
~~LOC~~ BKR RBC COUNT: 5 10*6/uL — ABNORMAL LOW (ref 4.40–5.50)

## 2023-10-18 LAB — POC ACTIVATED CLOTTING TIME (ISTAT): ~~LOC~~ BKR POCT ACT ISTAT: 222 s

## 2023-10-18 LAB — SODIUM,BG
SODIUM BLOOD GAS: 147 mmol/L (ref 137–147)
SODIUM BLOOD GAS: 148 mmol/L — ABNORMAL HIGH (ref 137–147)

## 2023-10-18 LAB — LACTIC ACID (BG - RAPID LACTATE): ~~LOC~~ BKR LACTIC ACID(SYRINGE): 3.4 mmol/L — ABNORMAL HIGH (ref 0.5–2.0)

## 2023-10-18 MED ORDER — DIGOXIN 250 MCG/ML (0.25 MG/ML) IJ SOLN
125 ug | Freq: Every day | INTRAVENOUS | 0 refills | Status: DC
Start: 2023-10-18 — End: 2023-10-19
  Administered 2023-10-19: 14:00:00 125 ug via INTRAVENOUS

## 2023-10-18 MED ORDER — VASOPRESSIN 20 UNITS/20ML SYR (1 UNIT/ML) (AN) (OSM)
INTRAVENOUS | 0 refills | Status: DC
Start: 2023-10-18 — End: 2023-10-18
  Administered 2023-10-18 (×2): 1 [IU] via INTRAVENOUS

## 2023-10-18 MED ORDER — LACTATED RINGERS IV SOLP
INTRAVENOUS | 0 refills | Status: DC
Start: 2023-10-18 — End: 2023-10-18

## 2023-10-18 MED ORDER — ROCURONIUM 10 MG/ML IV SOLN
INTRAVENOUS | 0 refills | Status: DC
Start: 2023-10-18 — End: 2023-10-18

## 2023-10-18 MED ORDER — ESMOLOL 100 MG/10 ML (10 MG/ML) IV SOLN
INTRAVENOUS | 0 refills | Status: DC
Start: 2023-10-18 — End: 2023-10-18

## 2023-10-18 MED ORDER — HEPARIN (PORCINE) 1,000 UNIT/ML IJ SOLN
INTRAVENOUS | 0 refills | Status: DC
Start: 2023-10-18 — End: 2023-10-18

## 2023-10-18 MED ORDER — LACTATED RINGERS IV SOLP
1000 mL | INTRAVENOUS | 0 refills | Status: CP
Start: 2023-10-18 — End: ?
  Administered 2023-10-19: 06:00:00 1000 mL via INTRAVENOUS

## 2023-10-18 MED ORDER — NICARDIPINE IN NACL (ISO-OS) 20 MG/200 ML IV PGBK (INFUSION)(AM)(OR)
INTRAVENOUS | 0 refills | Status: DC
Start: 2023-10-18 — End: 2023-10-18
  Administered 2023-10-18: 21:00:00 5 mg/h via INTRAVENOUS

## 2023-10-18 MED ORDER — METRONIDAZOLE IN NACL (ISO-OS) 500 MG/100 ML IV PGBK
500 mg | Freq: Two times a day (BID) | INTRAVENOUS | 0 refills | Status: DC
Start: 2023-10-18 — End: 2023-10-26
  Administered 2023-10-19 – 2023-10-26 (×15): 500 mg via INTRAVENOUS

## 2023-10-18 MED ORDER — SODIUM CHLORIDE 0.9 % IJ SOLN
50 mL | Freq: Once | INTRAVENOUS | 0 refills | Status: CP
Start: 2023-10-18 — End: ?
  Administered 2023-10-18: 16:00:00 50 mL via INTRAVENOUS

## 2023-10-18 MED ORDER — PROPOFOL INJ 10 MG/ML IV VIAL
INTRAVENOUS | 0 refills | Status: DC
Start: 2023-10-18 — End: 2023-10-18

## 2023-10-18 MED ORDER — CEFOXITIN 2 GRAM IV SOLR
INTRAVENOUS | 0 refills | Status: DC
Start: 2023-10-18 — End: 2023-10-18

## 2023-10-18 MED ORDER — FENTANYL CITRATE (PF) 50 MCG/ML IJ SOLN
25 ug | INTRAVENOUS | 0 refills | Status: CN | PRN
Start: 2023-10-18 — End: ?

## 2023-10-18 MED ORDER — MIDAZOLAM 1 MG/ML IJ SOLN
INTRAVENOUS | 0 refills | Status: DC
Start: 2023-10-18 — End: 2023-10-18

## 2023-10-18 MED ORDER — HELP MEDICATION
Freq: Every day | INTRAVENOUS | 0 refills | Status: DC
Start: 2023-10-18 — End: 2023-10-18

## 2023-10-18 MED ORDER — FENTANYL CITRATE (PF) 50 MCG/ML IJ SOLN
12.5 ug | INTRAVENOUS | 0 refills | Status: CN | PRN
Start: 2023-10-18 — End: ?

## 2023-10-18 MED ORDER — LABETALOL 5 MG/ML IV SOLN
10 mg | INTRAVENOUS | 0 refills | Status: DC | PRN
Start: 2023-10-18 — End: 2023-10-19

## 2023-10-18 MED ORDER — NOREPINEPHRINE IV DRIP D5W STD CONC (AM)(OR)
INTRAVENOUS | 0 refills | Status: DC
Start: 2023-10-18 — End: 2023-10-18
  Administered 2023-10-18: 18:00:00 .04 ug/kg/min via INTRAVENOUS

## 2023-10-18 MED ORDER — VASOPRESSIN IN 0.9 % SOD CHLOR 20 UNIT/100 ML (0.2 UNIT/ML) IV SOLN
1.8 [IU]/h | INTRAVENOUS | 0 refills | Status: DC
Start: 2023-10-18 — End: 2023-10-21
  Administered 2023-10-19 – 2023-10-21 (×6): 1.8 [IU]/h via INTRAVENOUS

## 2023-10-18 MED ORDER — POTASSIUM PHOSPHATE 500 ML IVPB
20 MMOL | Freq: Once | INTRAVENOUS | 0 refills | Status: CP
Start: 2023-10-18 — End: ?
  Administered 2023-10-18 (×2): 20 mmol via INTRAVENOUS

## 2023-10-18 MED ORDER — IOHEXOL 350 MG IODINE/ML IV SOLN
100 mL | Freq: Once | INTRAVENOUS | 0 refills | Status: CP
Start: 2023-10-18 — End: ?
  Administered 2023-10-18: 16:00:00 100 mL via INTRAVENOUS

## 2023-10-18 MED ORDER — MORPHINE 2 MG/ML IV SYRG
2-4 mg | INTRAVENOUS | 0 refills | Status: DC | PRN
Start: 2023-10-18 — End: 2023-10-19
  Administered 2023-10-19 (×2): 2 mg via INTRAVENOUS

## 2023-10-18 MED ORDER — ASPIRIN 300 MG RE SUPP
300 mg | Freq: Every day | RECTAL | 0 refills | Status: DC
Start: 2023-10-18 — End: 2023-10-25
  Administered 2023-10-19 – 2023-10-25 (×7): 300 mg via RECTAL

## 2023-10-18 MED ORDER — PHENYLEPHRINE HCL IN 0.9% NACL 1 MG/10 ML (100 MCG/ML) IV SYRG
INTRAVENOUS | 0 refills | Status: DC
Start: 2023-10-18 — End: 2023-10-18

## 2023-10-18 MED ORDER — NOREPINEPHRINE BITARTRATE-D5W 4 MG/250 ML (16 MCG/ML) IV SOLN
0-.5 ug/kg/min | INTRAVENOUS | 0 refills | Status: DC
Start: 2023-10-18 — End: 2023-10-19
  Administered 2023-10-18: 0.22 ug/kg/min via INTRAVENOUS
  Administered 2023-10-19: 07:00:00 0.18 ug/kg/min via INTRAVENOUS
  Administered 2023-10-19: 10:00:00 0.12 ug/kg/min via INTRAVENOUS
  Administered 2023-10-19: 04:00:00 0.14 ug/kg/min via INTRAVENOUS
  Administered 2023-10-19: 01:00:00 0.22 ug/kg/min via INTRAVENOUS

## 2023-10-18 MED ORDER — LIDOCAINE (PF) 200 MG/10 ML (2 %) IJ SYRG
INTRAVENOUS | 0 refills | Status: DC
Start: 2023-10-18 — End: 2023-10-18

## 2023-10-18 MED ORDER — LACTATED RINGERS IV SOLP
1000 mL | INTRAVENOUS | 0 refills | Status: DC
Start: 2023-10-18 — End: 2023-10-19

## 2023-10-18 MED ORDER — ADULT PN CONTINUOUS-ION BASED
INTRAVENOUS | 0 refills | Status: CP
Start: 2023-10-18 — End: ?
  Administered 2023-10-19: 02:00:00 2160.0000 mL via INTRAVENOUS

## 2023-10-18 MED ORDER — HEPARIN (PORCINE) IN 5 % DEX 20,000 UNIT/500 ML (40 UNIT/ML) IV SOLP
0-2000 [IU]/h | INTRAVENOUS | 0 refills | Status: DC
Start: 2023-10-18 — End: 2023-10-19
  Administered 2023-10-19: 02:00:00 800 [IU]/h via INTRAVENOUS

## 2023-10-18 MED ORDER — ALBUMIN, HUMAN 5 % 250 ML IV SOLP (AN)(OSM)
INTRAVENOUS | 0 refills | Status: DC
Start: 2023-10-18 — End: 2023-10-18

## 2023-10-18 MED ORDER — DIGOXIN 250 MCG/ML (0.25 MG/ML) IJ SOLN
125 ug | Freq: Every day | INTRAVENOUS | 0 refills | Status: CP
Start: 2023-10-18 — End: ?
  Administered 2023-10-19: 02:00:00 125 ug via INTRAVENOUS

## 2023-10-18 MED ORDER — HEPARIN (PORCINE) IN 5 % DEX 20,000 UNIT/500 ML (40 UNIT/ML) IV SOLP
0-2500 [IU]/h | INTRAVENOUS | 0 refills | Status: DC
Start: 2023-10-18 — End: 2023-10-19
  Administered 2023-10-19 (×2): 1700 [IU]/h via INTRAVENOUS

## 2023-10-18 MED ORDER — SODIUM CHLORIDE 0.9% IV SOLP
INTRAVENOUS | 0 refills | Status: DC
Start: 2023-10-18 — End: 2023-10-18

## 2023-10-18 MED ORDER — FENTANYL CITRATE (PF) 50 MCG/ML IJ SOLN
INTRAVENOUS | 0 refills | Status: DC
Start: 2023-10-18 — End: 2023-10-18

## 2023-10-18 MED ORDER — HEPARIN 2500UNITS NS 250ML IRR SOLN (OR)
0 refills | Status: DC
Start: 2023-10-18 — End: 2023-10-18
  Administered 2023-10-18 (×2): 250 mL

## 2023-10-18 MED ORDER — PHENYLEPHRINE 40 MCG/ML IN NS IV DRIP
INTRAVENOUS | 0 refills | Status: DC
Start: 2023-10-18 — End: 2023-10-18
  Administered 2023-10-18 (×2): .6 ug/kg/min via INTRAVENOUS

## 2023-10-18 MED ORDER — PROPOFOL 10 MG/ML IV EMUL
5-70 ug/kg/min | INTRAVENOUS | 0 refills | Status: DC
Start: 2023-10-18 — End: 2023-10-21
  Administered 2023-10-19 (×2): 30 ug/kg/min via INTRAVENOUS
  Administered 2023-10-19 (×2): 40 ug/kg/min via INTRAVENOUS
  Administered 2023-10-19: 03:00:00 30 ug/kg/min via INTRAVENOUS
  Administered 2023-10-20 (×3): 40 ug/kg/min via INTRAVENOUS
  Administered 2023-10-20 – 2023-10-21 (×6): 30 ug/kg/min via INTRAVENOUS

## 2023-10-18 MED ADMIN — PROPOFOL 10 MG/ML IV EMUL [11150]: 30 ug/kg/min | INTRAVENOUS | Stop: 2023-10-18 | NDC 43598054952

## 2023-10-18 MED ADMIN — VASOPRESSIN IN 0.9 % SOD CHLOR 20 UNIT/100 ML (0.2 UNIT/ML) IV SOLN [338494]: 1.8 [IU]/h | INTRAVENOUS | Stop: 2023-10-18 | NDC 00338964012

## 2023-10-18 NOTE — Operative Report(Direct Entry)
 OPERATIVE REPORT    Name: Blake Decker is a 73 y.o. male     DOB: 1951/01/24             MRN#: [id number]    DATE OF OPERATION: 10/18/2023    Surgeons and Role:  Panel 1:     * Deatra Canter, DO - Primary     * Zola Button, MD - Resident - Assisting     * Leandro Reasoner, MD - Resident - Assisting  Panel 2:     * Gibran Veselka, Carmelina Dane, DO - Primary     * Decamp, Lane Hacker, MD - Assisting     * Scherry Ran, MD - Resident - Assisting        Preoperative Diagnosis:    Mesenteric ischemia (HCC) [K55.9]    Post-op Diagnosis      * Mesenteric ischemia (HCC) [K55.9]    Procedure(s) (LRB):  Aortomesenteric bypass to hepatic and SMA with 12x6 bifurcated graft          Description and Findings of Operative Procedure:   After informed consent was obtained, the patient was brought to the operating room placed in supine position on the table.  General anesthesia was induced.  A timeout was performed confirm the correct patient, positioning, procedure, allergies and antibiotics.  Patient was prepped and draped in usual sterile fashion with the abdomen and bilateral thighs exposed.  The general surgery team performed a reexploration laparotomy and bowel resection.  Please see their dictated operative report for a description of this portion of the procedure.  Following this, I performed exposure of the superior mesenteric artery and the mesentery just beyond the ligament of Treitz.  The SMA was mobilized throughout its course within this portion of the mesentery and any side branches were encircled with Vesseloops.  I then started the tunnel proximally on the SMA underneath the pancreas.  I then turned my attention to the supraceliac aorta.  The self-retaining retractor was replaced.  The falciform ligament was doubly ligated and divided.  The remainder of the falciform ligament was then taken down.  The left triangular ligament of the liver was also taken down.  The lesser sac was divided and the esophagus was carefully mobilized.  I then identified the right crus of the diaphragm and this was divided over the aorta.  This exposed the supraceliac aorta.  I then turned my attention to exposing the hepatic artery.  This was identified on the lesser curve of the stomach and was mobilized throughout its course at this site, taking care not to injure any other side branches.  A tunnel was then created from the exposed to supraceliac aorta to the SMA beneath the pancreas.  An umbilical tape was left at this site.  The patient was then systemically heparinized.  The aorta was controlled with a [employer] K clamp and an anterior arteriotomy was made and was extended with Potts scissors.  An aortic punch was utilized to create a's slightly larger lumen here.  A 12 x 6 Gore-Tex graft was brought onto the field and was sewn into place utilizing a 3-0 Prolene in a running fashion.  Flow was then restored first on the graft and then down the remainder of the aorta.  The graft was then the brought over to the hepatic artery.  The hepatic artery was controlled proximally and distally and a longitudinal arteriotomy was made and extended with the Potts scissors.  An anastomosis was sewn here after  the graft was cut to appropriate length and size utilizing a 5-0 Prolene in running fashion.  The vessels were allowed to forward and backbleed prior to completion of the anastomosis and all air debris was flushed free.  Flow was then restored through the hepatic.  I then inspected the site and was noted to be hemostatic.  The SMA limb was then tunneled beneath the pancreas and the previously created tunnel.  The SMA was then controlled along all of his side branches and a longitudinal arteriotomy was made and extended with the Potts scissors.  A focal endarterectomy was performed at the site which did not need to extend into the branches.  The graft was then cut to appropriate length and size and anastomosis was sewn utilizing a 5-0 Prolene in a running fashion.  The vessels were allowed to forward and backbleed prior to completion the anastomosis and all air and debris was flushed free.  Flow was then restored first through smaller sidebranches and then through the main branch.  The SMA pulse was palpable in the mesentery near the bowel wall.  We closed the mesenteric defect overlying this with a 2-0 Vicryl in running fashion.  The lesser sac was reapproximated with the lesser omentum.  The sites were all noted to be hemostatic at the time of completion.  At this point I turned the case back over to the general surgeons.  Again, please see their dictated operative report for description of their portion of the procedure.  Dr. Naomie Dean assisted me on this case as no qualified resident was available.  He assisted with the exposure of the arteries, mesenteric bypass and hepatic bypass.    Estimated Blood Loss:  50 cc    Specimen(s) Removed/Disposition:   ID Type Source Tests Collected by Time Destination   1 : Abdominal Fluid Fluid Abdomen CULTURE-ANAEROBIC, CULTURE-WOUND/TISSUE/FLUID(AEROBIC ONLY)W/SENSITIVITY, CULTURE-TB (AFB) (Canceled), CULTURE-FUNGAL,OTHER, LEUKEMIA/LYMPHOMA PANEL, CYTOLOGY NON-GYN Deatra Canter, DO 10/18/2023 1249    2 : small bowel Tissue Bowel Contents SURGICAL PATHOLOGY Deatra Canter, DO 10/18/2023 1305    A :  Blood Arterial BLOOD GASES, ARTERIAL, LACTIC ACID (BG - RAPID LACTATE), POTASSIUM, BG, SODIUM,BG, Stacy Gardner, DO 10/18/2023 1328    B :  Blood Arterial BLOOD GASES, ARTERIAL Deatra Canter, DO 10/18/2023 1328    C :  Blood Venous HEMOGLOBIN & HEMATOCRIT Deatra Canter, DO 10/18/2023 1328    D :  Blood Capillary POC GLUCOSE Deatra Canter, DO 10/18/2023 1328    E :  Blood Venous PROTIME INR (PT) Deatra Canter, DO 10/18/2023 1328    F :  Blood Venous PTT (APTT) Deatra Canter, DO 10/18/2023 1328    G :  Blood Venous POTASSIUM Deatra Canter, DO 10/18/2023 1328    H :  Blood Arterial GLUCOSE,BG Deatra Canter, DO 10/18/2023 1328    I :  Blood Arterial HEMOGLOBIN & HEMATOCRIT, BG Deatra Canter, DO 10/18/2023 1426    J :  Blood Venous LACTIC ACID (BG - RAPID LACTATE) Deatra Canter, DO 10/18/2023 1426    K :  Blood Arterial SODIUM,BG Deatra Canter, DO 10/18/2023 1426    L :  Blood Arterial GLUCOSE,BG Deatra Canter, DO 10/18/2023 1426    M :  Blood Arterial IONIZED CALCIUM,BG Deatra Canter, DO 10/18/2023 1426    N :  Blood Arterial POTASSIUM, BG Deatra Canter, DO 10/18/2023 1426    O :  Blood Arterial BLOOD  GASES, ARTERIAL Deatra Canter, DO 10/18/2023 1426    P :  Blood Arterial BLOOD GASES, ARTERIAL (Canceled) Deatra Canter, DO 10/18/2023 1520    Q :  Blood Arterial HEMOGLOBIN & HEMATOCRIT, BG (Canceled) Deatra Canter, DO 10/18/2023 1520    R :  Blood Venous LACTIC ACID (BG - RAPID LACTATE) (Canceled) Deatra Canter, DO 10/18/2023 1520    S :  Blood Arterial GLUCOSE,BG (Canceled) Deatra Canter, DO 10/18/2023 1520    T :  Blood Arterial IONIZED CALCIUM,BG (Canceled) Deatra Canter, DO 10/18/2023 1520    U :  Blood Arterial POTASSIUM, BG (Canceled) Deatra Canter, DO 10/18/2023 1520    V :  Blood Arterial SODIUM,BG (Canceled) Deatra Canter, DO 10/18/2023 1520    W :  Blood Arterial BLOOD GASES, ARTERIAL Deatra Canter, DO 10/18/2023 1541    X :  Blood Arterial IONIZED CALCIUM,BG Deatra Canter, DO 10/18/2023 1541    Y :  Blood Arterial GLUCOSE,BG Deatra Canter, DO 10/18/2023 1541    Z :  Blood Venous LACTIC ACID (BG - RAPID LACTATE) Deatra Canter, DO 10/18/2023 1541    AA :  Blood Arterial SODIUM,BG Deatra Canter, DO 10/18/2023 1541    AB :  Blood Arterial POTASSIUM, BG Deatra Canter, DO 10/18/2023 1541        Attestation: I performed this procedure with a resident. and I was present for the entire procedure.    Complications:  None      Implants:   Implant Name Serial No. Manufacturer Lot No. LRB No. Used Action   GRAFT VASCULAR 40CM  STANDARD WALL THIN WALL 2 - [id number] [employer] N/A N/A 1 Implanted       Drains: Details on drains available on LDA report    Disposition:   Remains in OR with general surgery team          Dewitt Rota, DO  Pager

## 2023-10-18 NOTE — Progress Notes
 Nutrition Support Service (NSS) Progress Note      PN therapy start date: 10/13/2023  PN Indication: NPO for 7 days or more (to date or expected)   Estimated PN therapy end date: unknown, when EN vs PO feasible and meeting at least 65% estimated needs consistently     Diet Order: NPO  TF: n/a  Pertinent allergies/intolerances: No known    I/O: 3477 ml/ 5220 ml  NGT: 2350 ml  UOP: 2870 ml BM x9    Meds: Scheduled Meds:acetaminophen (OFIRMEV) 1,000 mg injection 100 mL, 1,000 mg, Intravenous, Q6H*  amitriptyline/gabapentin/baclofen(#) 2/6/2 % topical cream, , Topical, Q8H  [Held by Provider] amLODIPine (NORVASC) tablet 10 mg, 10 mg, Feeding Tube, QDAY  aspirin chewable tablet 81 mg, 81 mg, Feeding Tube, QDAY  atorvastatin (LIPITOR) tablet 40 mg, 40 mg, Feeding Tube, QDAY  bisacodyL (DULCOLAX) rectal suppository 10 mg, 10 mg, Rectal, BID  busPIRone (BUSPAR) tablet 15 mg, 15 mg, Feeding Tube, TID  cefTRIAXone (ROCEPHIN) IVP 2 g, 2 g, Intravenous, Q24H*  [Held by Provider] digoxin (LANOXIN) tablet 125 mcg, 125 mcg, Feeding Tube, QDAY  enoxaparin (LOVENOX) syringe 110 mg, 1 mg/kg, Subcutaneous, BID  lidocaine (LIDODERM) 5 % topical patch 2 patch, 2 patch, Topical, QDAY  [Held by Provider] lisinopriL (ZESTRIL) tablet 20 mg, 20 mg, Oral, BID  metoprolol (LOPRESSOR) injection 5 mg, 5 mg, Intravenous, Q6H*  [Held by Provider] oxyCODONE (ROXICODONE) tablet 10 mg, 10 mg, Feeding Tube, TID  pantoprazole (PROTONIX) injection 40 mg, 40 mg, Intravenous, QDAY  [Held by Provider] polyethylene glycol 3350 (MIRALAX) packet 17 g, 1 packet, Per NG tube, BID  potassium phosphate 20 mmol in dextrose 5% (D5) 500 mL IVPB, 20 mmol, Intravenous, ONCE  [Held by Provider] pregabalin (LYRICA) capsule 200 mg, 200 mg, Feeding Tube, TID  [Held by Provider] rOPINIRole (REQUIP) tablet 1 mg, 1 mg, Oral, QHS  [Held by Provider] sennosides-docusate sodium (SENOKOT-S) tablet 2 tablet, 2 tablet, Per NG tube, QDAY  venlafaxine (EFFEXOR) tablet 75 mg, 75 mg, Per [place] Tube, BID    Continuous Infusions:   Adult Continuous Parenteral Nutrition (PN) 90 mL/hr at 10/17/23 2035    morphine PCA 50 mg/50 mL infusion syr (std conc)(premade)       PRN and Respiratory Meds:albuterol-ipratropium Q4H PRN, hydrALAZINE Q3H PRN, labetalol (NORMODYNE; TRANDATE) injection Q3H PRN, methocarbamoL Q8H PRN, nalOXone PRN, ondansetron Q6H PRN **OR** ondansetron (ZOFRAN) IV Q6H PRN, [Held by Provider] oxyCODONE Q4H PRN, [Held by Provider] traZODone QHS PRN    Electrolyte Treatments: 80 mEq IV KCl and 15 mmol IV NaPhos 1/20; 20 mEq IV KPhos 1/21    Pertinent Labs:   Comprehensive Metabolic Profile    Lab Results   Component Value Date/Time    NA 151 (H) 10/18/2023 03:16 AM    K 3.5 10/18/2023 03:16 AM    CL 110 10/18/2023 03:16 AM    CO2 29 10/18/2023 03:16 AM    GAP 12 10/18/2023 03:16 AM    BUN 33 (H) 10/18/2023 03:16 AM    CR 0.61 10/18/2023 03:16 AM    GLU 135 (H) 10/18/2023 03:16 AM    Lab Results   Component Value Date/Time    CA 9.8 10/18/2023 03:16 AM    PO4 2.5 10/18/2023 03:16 AM    ALBUMIN 3.0 (L) 10/13/2023 12:24 PM    TOTPROT 6.0 10/13/2023 12:24 PM    ALKPHOS 82 10/13/2023 12:24 PM    AST 7 10/13/2023 12:24 PM    ALT 6 (L) 10/13/2023 12:24 PM  TOTBILI 0.9 10/13/2023 12:24 PM    GFR >60 10/18/2023 03:16 AM    GFRAA >60 05/28/2019 06:06 PM        Lab Results   Component Value Date    MG 2.2 10/18/2023     Lab Results   Component Value Date    TRIG 96 10/18/2023     No results found for: GLUPOC    Admit Weight (Kg): Weight: 117.9 kg (260 lb)   Current Weight (Kg): Weight: 109.7 kg (241 lb 13.5 oz)  TPN Dose Weight (Kg): 83.3 kg (desired wt at BMI=24.9)    Estimated Kcal Needs:2100 (25 kcal/kg)  Estimated Protein Needs:125-140g  (1.5-1.7 g/kg)    Malnutrition Details:  Malnutrition present on admission  ICD-10 code E44: Acute illness/Moderate non-severe malnutrition  Energy Intake: Less than 75% of estimated energy requirement for greater than 7days, Weight loss: 1-2% x 1 week Loss of Subcutaneous Fat: No      Muscle Wasting: No                   Malnutrition Interventions: TPN.     A/P:   Blake Decker is a 73 y.o. male with a PMH that includes HTN, Afib, CAD s/p PCI w/ stent (2014), dilated cardiomyopathy, PVD, COPD, OSA, tobaccoism who presented with increasing abdominal pain. 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  Now, s/p re-opening of recent laparotomy, enterectomy resection and anastomosis on 1/16.  Started on PN 1/16 due to current nutrition status and anticipated continued NPO status.  (Refer to NSS consult 1/16 for nutrition/wt hx details).      1/21: Pt on HFNC, remains NPO with NGT to LIWS with 2350 ml over past 24 hrs.  Day 6 of central PN. CT obtained yesterday with decreased opacification of the venous mesenteric vessels, concerning for hypoperfusion. A CTA abdomen was obtained today showing occlusion of the SMA and celiac axis stents as well as possible pneumatosis of the colon. E-stat to OR 1/21 for reopening laparotomy, possible bowel resection, likely leaving in discontinuity followed by revascularization and abtherra placement.  Pt with continued hypernatremia despite decreasing Na+ from LR to 1/4 NS equivalency 1/20; will remove completely from PN for now. Will slightly increase K+ and phos and shift to less chloride additives based on serum lab trends. Will maintain current PN rate/volume for now with possible return from OR with open abdomen. 40 mg lasix provided 1/20; holding diuresis at this time. Pt is at goal macros within PN.     PN Changes (to start at 20:30):  Decreased Na+ from 83 to 0 mEq  Increased K+ from 100 to 110 mEq  Increased phosphorus from 43 to 47 mmol  Adjusted from Max chloride to 1:1 chloride:acetate ratio      PN Order (to start at 20:30):  Nonstandard TPN: 90 ml/hr x 24hr (2160 ml total volume)    Macronutrients: AA  130g (goal), Dex 280 g (goal), SMOFlipids 315 ml (goal)    Lytes: Na 0 mEq, K 110 mEq, Ca 10 mEq, Mg 24 mEq, Phos 47 mmol, 1:1 chloride:acetate ratio  Additives: 10 ml multivitamin, 1ml trace elements, and 100mg  thiamine     PN to provide 2102 kcals (25 kcals/kg; 100% estimated needs), 130 g AA (1.56 g/kg, 100% estimated needs)    Willette Cluster, MA,  RD, LD, CNSC  Available on Voal[employer]

## 2023-10-18 NOTE — Progress Notes
 Brief Vascular Surgery Progress Note    - CTA obtained today demonstrates occulusion of celiac and SMA stents. Distal branches of the SMA opacified by contrast due to collaterals  - Will obtain vein mapping in preparation for mesenteric artery bypass as he is not amendable to additional endovascular interventions.  - I discussed findings and plan for operative intervention with him and with his wife, Bocephus Cali, over the phone. Patient was confused this morning and asked that his wife provide formal informed consent.  - We discussed the need for SMA and possible celiac artery bypass. We discussed that this may be performed utilizing his own veins and that would require vein harvest. We discussed that we may need to use synthetic graft for the bypass and that there is increased risk of infection with the use of synthetic graft. We discussed that we are not sure how his bowel looks until we open his abdomen and much of what is done in the procedure may be determined by intra operative findings. I let her know that he may return to the ICU on a ventilator. We discussed risks of bleeding, infection, injury to surrounding structures, poor wound healing, possible DVT/PE, graft failure, myocardial infarction, and death. Regino Bellow in agreement that we should proceed with operative intervention.     Scherry Ran, PGY-4  General Surgery  [phone]

## 2023-10-18 NOTE — Progress Notes
 Surgical Critical Care Progress Note    Today's Date: 10/18/2023   Hospital Day: Hospital Day: 9    History of Present Illness:   Blake Decker is a 73 y.o. 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, SMA stenosis s/p ex lap, SBR, in discontinuity, abthera placement (1/13) s/p angio SMA, hepatic artery, celiac stent  (1/14) s/p ex lap, primary anastomosis, abdominal wall closure (1/16)      Patient Active Problem List    Diagnosis Date Noted    Leukocytosis, unspecified 10/14/2023    S/P small bowel resection 10/12/2023    Moderate malnutrition (HCC) 10/11/2023    Abdominal pain, generalized 10/11/2023    Diarrhea 10/11/2023    Mesenteric ischemia (HCC) 10/11/2023    Atrial fibrillation (HCC) 10/11/2023    Acute blood loss anemia 10/11/2023    Severe sepsis with septic shock (HCC) 10/11/2023    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    Arm pain, left 10/04/2018    Acute respiratory failure with hypoxia (HCC) 09/25/2018    Severe sepsis (HCC) 09/25/2018    Olecranon bursitis of left elbow 09/25/2018    Cellulitis 09/23/2018    Atrial fibrillation with RVR (HCC) 09/23/2018    Coronary artery disease involving native coronary artery 09/23/2018    COPD (chronic obstructive pulmonary disease) (HCC) 09/23/2018    OSA (obstructive sleep apnea) 09/23/2018       Assessment & Plan:   Neuro   Acute pain due to trauma  Multimodal pain regimen:  -Tylenol IV 1000mg  q6h prn   -Robaxin 750 mg q8h prn   -Lidocaine topical patch daily   -morphine PCA  -Oxy 10 mg TID. Oxy 5-15 mg q4h prn   -TAPPS block per APS 1/16    Anxiety  - PTA venlafaxine and buspar resumed ok to give even with NPO status    GCS 15 (E4,V5,M6)    CV   Undifferentiated shock versus hypovolemic versus septic, resolved, HTN, Afib (Xarelto), CAD s/p PCI w/ stent (2014), dilated cardiomyopathy, angio SMA, hepatic artery, celiac stent (1/14)  Tachycardia (95-126)  - Vital signs stable, A fib, continue to monitor. MAP > 65 SBP < 180.   - PTA antihypertensives held due to ileus   - PTA xarelto held.   - ASA/statin resumed  - PTA Metoprolol tartrate 50 BID held due to NPO status   - started metop IV 5mg  q6h  - Digoxin 125 mcg transitioned to IV   -Therapeutic lovenox  - Mesenteric stents placed per vasc (1/14). Okay for AC, PO ASA. No need for Plavix at discharge  - Per vasc okay for xarelto and ASA at discharge   - CTA chest obtained yesterday without PE, there is evidence of aspiration pneumonia     Pulm  Acute hypoxic respiratory failure,   Hx of COPD, OSA, tobaccoism   - currently on 1L HFNC    GI/ Fluids / Electrolytes / Nutrition  Risk for constipation, Risk for electrolyte imbalance, chronic mesenteric ischemia, SMA stenosis s/p ex lap, SBR, in discontinuity ,abthera placement (1/13) s/p ex lap, primary anastomosis, abdominal wall closure (1/16)  - NPO okay to use NG for meds. In continuity. Protonix 40 mg daily. NGT 1.6 L/24hrs, gastric contents  - Home lasix 40 mg every morning currently held  - Bowel regimen: suppositories BID, had 9 BM charted  - TPN running, continue   - Zofran prn nausea  -  CT abdomen/pelvis from 1/20: without significant opacification of the stents suggestive of acute/subacute in-stent thrombosis. There also appears to be diffuse distension of the small and large bowel suggestive of ileus vs potential bowel ischemia  - Obtained CTA abdomen/pelvis with confirmed thrombosis of both celiac and SMA stents and pneumatosis will ESTAT to OR with vascular surgery for bypass, ex lap, possible SBR, possible ostomy, consent was obtained from patient's wife tina     GU    - Cr  0.61,  UOP 2870,  1.0 ml/kg/hr  - Fluid status 24 hours: - Since Admit: -1648  - Maintain foley     Heme/ID  Acute blood loss anemia  - Hgb stable. No clinical signs of overt bleeding. Continue to trend serially. Optimize fluid balance. Monitor stools for signs of occult GI bleeding.    Leukocytosis  - WBC 18 (24),  Afebrile.   - Cultures 1/13: Urine, no growth. Fecal rotavirus, cryptosporidium, cdiff negative   - Blood cultures 1/20: NGTD  - ABX: on ancef for suspected RLL PNA    Endo  -No acute issues. Monitor and treat prn. Controlled without use of subcutaneous insulin    MS  Impaired mobility and activities of daily living  -PT/OT  -NWB LLE (fibular fracture repaired PTA), discussed with ortho on call     Daily Quality Checklist  Lines:  3 Peripheral IV   R IJ central line   Foley Cath  NGT  Activity progressive mobility but NWB LLE   GI Prophylaxis: protonix  DVT Prophylaxis: hep gtt, transition to therapeutic lovenox today  Nutrition: NPO, NG ok for meds   Bowel Regimen: miralax, senna, suppositories  Lab Frequency: Daily   Daily CXR: No  Disposition: SICU    Subjective    Pain well controlled, still requesting to drink pop, wants to leave hospital    Objective:    Physical Exam:  General: alert, confused, responsive   HEENT: Normocephalic, atraumatic; mucus membranes moist, NGT in place with gastric contents  Lungs: on 1L NC, normal effort of respiration   Heart: A fib, rate controlled  Abdomen:  Soft, tender on deep palpation, incision with staples C/D/I  Extremities:  No edema; Warm, without cyanosis, LLE in boot from fibula fracture (surgery 12/31)    Labs:    Complete Blood Counts   Recent Labs     10/16/23  0323 10/17/23  0159 10/18/23  0316   HGB 13.5 13.4* 14.4   HCT 40.2 41.6 43.6   WBC 29.80*  --   --    PLTCT 454* 455* 403*   MCV 85.6 85.1 86.9   MCH 28.6 27.4 28.6   MCHC 33.5 32.2 32.9   RDW 14.6 15.2* 15.4*   MPV 8.0 8.2 8.5     Recent Labs     10/17/23  0159   LYMPH 1*   MONO 13*   PLTEST Slight Increase          Chemistry Panel   Recent Labs     10/15/23  1422 10/16/23  0323 10/17/23  0159 10/18/23  0316   NA 137 141 148* 151*   K 3.7 3.5 3.2* 3.5   CL 97* 104 102 110   CO2 30 30 32* 29   BUN 30* 32* 34* 33*   CR 0.59 0.57 0.60 0.61   GLU 221* 148* 123* 135*   GAP 10 7 14* 12   GFR >60 >60 >60 >60   MG  --  1.7  1.9 2.2   CA 9.2 9.4 9.5 9.8   PO4  --  2.2 2.1 2.5     No results for input(s): ALKPHOS, AST, ALT, TOTPROT, TOTBILI, ALBUMIN, AMYLASE, LIPASE in the last 72 hours.       Coagulation Studies   Recent Labs     10/15/23  1249 10/15/23  1809 10/16/23  0323   PTT 56.1* 48.9* 50.0*          Vital Signs (24 hours)  BP: (108-223)/(71-121)   Temp:  [36.6 ?C (97.8 ?F)-36.8 ?C (98.3 ?F)]   Pulse:  [95-126]   Respirations:  [12 PER MINUTE-26 PER MINUTE]   SpO2:  [90 %-97 %]   O2 Device: High flow nasal cannula  O2 Liter Flow: 1 Lpm    Medications  Scheduled Meds:acetaminophen (OFIRMEV) 1,000 mg injection 100 mL, 1,000 mg, Intravenous, Q6H*  amitriptyline/gabapentin/baclofen(#) 2/6/2 % topical cream, , Topical, Q8H  [Held by Provider] amLODIPine (NORVASC) tablet 10 mg, 10 mg, Feeding Tube, QDAY  aspirin chewable tablet 81 mg, 81 mg, Feeding Tube, QDAY  atorvastatin (LIPITOR) tablet 40 mg, 40 mg, Feeding Tube, QDAY  bisacodyL (DULCOLAX) rectal suppository 10 mg, 10 mg, Rectal, BID  busPIRone (BUSPAR) tablet 15 mg, 15 mg, Feeding Tube, TID  cefTRIAXone (ROCEPHIN) IVP 2 g, 2 g, Intravenous, Q24H*  [Held by Provider] digoxin (LANOXIN) tablet 125 mcg, 125 mcg, Feeding Tube, QDAY  enoxaparin (LOVENOX) syringe 110 mg, 1 mg/kg, Subcutaneous, BID  lidocaine (LIDODERM) 5 % topical patch 2 patch, 2 patch, Topical, QDAY  [Held by Provider] lisinopriL (ZESTRIL) tablet 20 mg, 20 mg, Oral, BID  metoprolol (LOPRESSOR) injection 5 mg, 5 mg, Intravenous, Q6H*  [Held by Provider] oxyCODONE (ROXICODONE) tablet 10 mg, 10 mg, Feeding Tube, TID  pantoprazole (PROTONIX) injection 40 mg, 40 mg, Intravenous, QDAY  [Held by Provider] polyethylene glycol 3350 (MIRALAX) packet 17 g, 1 packet, Per NG tube, BID  potassium phosphate 20 mmol in dextrose 5% (D5) 500 mL IVPB, 20 mmol, Intravenous, ONCE  [Held by Provider] pregabalin (LYRICA) capsule 200 mg, 200 mg, Feeding Tube, TID  [Held by Provider] rOPINIRole (REQUIP) tablet 1 mg, 1 mg, Oral, QHS  [Held by Provider] sennosides-docusate sodium (SENOKOT-S) tablet 2 tablet, 2 tablet, Per NG tube, QDAY  venlafaxine (EFFEXOR) tablet 75 mg, 75 mg, Per [place] Tube, BID    Continuous Infusions:   Adult Continuous Parenteral Nutrition (PN) 90 mL/hr at 10/17/23 2035    morphine PCA 50 mg/50 mL infusion syr (std conc)(premade)       PRN and Respiratory Meds:albuterol-ipratropium Q4H PRN, hydrALAZINE Q3H PRN, labetalol (NORMODYNE; TRANDATE) injection Q4H PRN, methocarbamoL Q8H PRN, nalOXone PRN, ondansetron Q6H PRN **OR** ondansetron (ZOFRAN) IV Q6H PRN, [Held by Provider] oxyCODONE Q4H PRN, [Held by Provider] traZODone QHS PRN      I have personally reviewed pertinent labs, medications, radiology, and diagnostic procedures including: active problem list, medication list, allergies, family history, social history, health maintenance, notes from last encounter, lab results, imaging.     Lanae Boast, MD  Electronically Signed  10/18/2023 8:15 AM  Available on [employer]

## 2023-10-18 NOTE — Progress Notes
 Daily Progress Note      Date of Service:  10/18/2023  Name:  Laquincy Eastridge                       MRN:  [id number]   Admission Date: 10/10/2023 (LOS: 8 days)                     Assessment: Alim Cattell is a 73 y.o. 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 s/p lap SBR, abthera placement (1/13) s/p celiac and SMA stent placement via L radial access (1/14) s/p anastomosis, abdominal closure (1/16)     Principal Problem:    Superior mesenteric artery stenosis (HCC)  Active Problems:    Moderate malnutrition (HCC)    Abdominal pain, generalized    Diarrhea    Mesenteric ischemia (HCC)    Atrial fibrillation (HCC)    Acute blood loss anemia    Severe sepsis with septic shock (HCC)    S/P small bowel resection    Leukocytosis, unspecified      Plan:  - Afib with RVR over the weekend and leukocytosis. CT obtained by SICU to r/o anastomotic leak with concern for stent occlusion though contrast timing was not appropriate to fully evaluate patency   - Continues to have high NGT output while having multiple bowel movements. Unsure if they were bloody   - Abdominal exam is benign. Distended but completely non tender to palpation.   - Recommend obtaining CTA to evaluate patency of the stents  - Continue ASA and therapeutic anticoagulation   - Please page [phone] with questions, concerns, or changes in clinical status.       Discussed with Dr. Lourdes Sledge  ________________________________________________________    Subjective:  No acute events overnight. Continues to have high NGT output. Confused this morning    Objective:  BP: (109-223)/(74-121)   Temp:  [36.6 ?C (97.8 ?F)-36.8 ?C (98.3 ?F)]   Pulse:  [95-126]   Respirations:  [12 PER MINUTE-26 PER MINUTE]   SpO2:  [90 %-97 %]   O2 Device: High flow nasal cannula  O2 Liter Flow: 1 Lpm  Body mass index is 32.79 kg/m?Marland Kitchen    Lab Results   Component Value Date/Time    HGB 14.4 10/18/2023 03:16 AM HCT 43.6 10/18/2023 03:16 AM    WBC 29.80 (H) 10/16/2023 03:23 AM    PLTCT 403 (H) 10/18/2023 03:16 AM    INR 1.3 (H) 10/13/2023 12:24 PM     Lab Results   Component Value Date/Time    NA 151 (H) 10/18/2023 03:16 AM    K 3.5 10/18/2023 03:16 AM    CL 110 10/18/2023 03:16 AM    CO2 29 10/18/2023 03:16 AM    BUN 33 (H) 10/18/2023 03:16 AM    CR 0.61 10/18/2023 03:16 AM    MG 2.2 10/18/2023 03:16 AM    PO4 2.5 10/18/2023 03:16 AM    CA 9.8 10/18/2023 03:16 AM     No results found for: GLUPOC       Physical Exam  General: Extubated. Alert. Confused.   Head: Normocephalic, without obvious abnormality, atraumatic. NGT in place with bilious output  Eyes: PERRL, EOMI, no scleral icterus  Neck: Supple, symmetrical, trachea midline  Lungs: Sating well on RA. Symmetric chest rise. No distress  Heart: Intermittently tachycardic. Irregular rhythm. Normotensive.   Abdomen: Soft, distended. Non tender to palpation  Skin: Skin color, texture, turgor normal. No rashes or lesions.   Extremity: No clubbing, cyanosis, or edema  Neurologic: Sedated      ICD-10 code E44: Acute illness/Moderate non-severe malnutrition  Energy Intake: Less than 75% of estimated energy requirement for greater than 7days, Weight loss: 1-2% x 1 week        Loss of Subcutaneous Fat: No      Muscle Wasting: No             Malnutrition Interventions: Nutrition assessment, avoid prolonged NPO status, monitor nutrition plans.    Active Wounds          Wounds Surgical incision Medial Abdomen (Active)   10/13/23 1144   Wound Type: Surgical incision   Orientation: Medial   Location: Abdomen   Wound Location Comments:    Initial Wound Site Closure: Sutures;Staples   Initial Dressing Placed: Gauze;Hypafix tape   Initial Cycle:    Initial Suction Setting (mmHg):    Pressure Injury Stages:    Pressure Injury Present Within 24 Hours of Hospital Admission:    If This Pressure Injury Is Suspected to Be Device Related, Please Select the Device::    Is the Wound Open or Closed:    Wound Assessment Red 10/18/23 0400   Wound Site Closure Sutures;Staples 10/18/23 0400   Peri-wound Assessment Dry;Intact 10/18/23 0400   Wound Drainage Amount Scant 10/18/23 0400   Wound Drainage Description Serosanguineous 10/18/23 0400   Wound Dressing Status None/open to air 10/18/23 0400   Wound Care Dressing changed or new application 10/16/23 0800   Wound Dressing and/or Treatment Gauze 10/17/23 0400   Number of days: 5                                              ___________________________    Scherry Ran, MD   Service Pager: # [phone]

## 2023-10-18 NOTE — Anesthesia Post-Procedure Evaluation
Post-Anesthesia Evaluation    Name: Blake Decker      MRN: [id number]     DOB: 01-24-1951     Age: 73 y.o.     Sex: male   __________________________________________________________________________     Procedure Information       Anesthesia Start Date/Time: 10/18/23 1213    Procedures:       REPAIR RUPTURED SPLEEN WITH/ WITHOUT PARTIAL SPLENECTOMY (Abdomen)      ENTERECTOMY RESECTION AND ANASTOMOSIS (Abdomen)      BYPASS OF MESENTERIC AND POSSIBLE CELIAC ARTERIES, POSSIBLE VEIN HARVEST    Location: MAIN OR 11 / Main OR/Periop    Surgeons: Deatra Canter, DO; [place], [place], DO            Post-Anesthesia Vitals  ART BP: 131/52 (01/21 1709)   Vitals Value Taken Time   BP 130/55    Temp 36.8    Pulse 115 10/18/23 1726   Respirations 18 PER MINUTE 10/18/23 1726   SpO2 100 % 10/18/23 1722   O2 Device ETT    ABP     ART BP     Vitals shown include unfiled device data.      Post Anesthesia Evaluation Note    Evaluation location: ICU  Patient participation: patient intubated, unable to assess; expectation of recovery by ICU physician  Level of consciousness: intubated & sedated  Pain management: adequate    Hydration: normovolemia  Temperature: 36.0?C - 38.4?C  Airway patency: adequate    Perioperative Events       Post-op nausea and vomiting: no PONV    Postoperative Status  Cardiovascular status: hemodynamically stable  Respiratory status: ETT and supplemental oxygen  Follow-up needed: none  ICU Information  VasoactiveDrips:norepinephrine infusion and phenylephrine infusion  Blood Products Given-yes  PRBC units given: 2              Staff involved in transport include: anesthesiologist, OR nurse, surg resident, anes resident and resp therapy      Perioperative Events  There were no known complications for this encounter.

## 2023-10-18 NOTE — Progress Notes
 Pt educated to STRICT NPO, pt is resting in bed A&Ox3, confused with situation, attempting to get out of bed, following commands, + Foley at gravity, pt's pain observed & addressed with medication & repositioning, HOB elevated, + splint LLE, pulses are palpable, assisted patient in calling wife [name] for emotional support, Call light within reach, plan of care ongoing.      10/18/23 1200   Critical Care Vitals Adult   Temp 37 ?C (98.6 ?F)   Temperature Source Oral   Pulse 105   Monitored Rhythm Atrial fibrillation   Respirations 21 PER MINUTE   SpO2 93 %   O2 Device None (Room air)   BP 104/51   Mean NBP (Calculated) 69 MM HG   BP Source Arm, Left Upper

## 2023-10-18 NOTE — Unmapped
 Brief Operative Note    Name: Blake Decker is a 73 y.o. male     DOB: 1951-07-26             MRN#: [id number]  DATE OF OPERATION: 10/18/2023    Date:  10/18/2023        Preoperative Dx:   Mesenteric ischemia (HCC) [K55.9]    Post-op Diagnosis      * Mesenteric ischemia (HCC) [K55.9]    Procedure(s) (LRB):  REPAIR RUPTURED SPLEEN WITH/ WITHOUT PARTIAL SPLENECTOMY  BYPASS OF MESENTERIC AND POSSIBLE CELIAC ARTERIES, POSSIBLE VEIN HARVEST  ENTERECTOMY RESECTION AND ANASTOMOSIS    Surgeons and Role:  Panel 1:     Beryl Meager, Lorin Picket, DO - Primary     Zola Button, MD - Resident - Assisting     * Leandro Reasoner, MD - Resident - Assisting  Panel 2:     * Hessel, Carmelina Dane, DO - Primary     * Decamp, Lane Hacker, MD - Assisting     * Scherry Ran, MD - Resident - Assisting      Findings:  failure of small bowel anastomosis. Resected and left in discontinuity. Several areas of distal bowel with patchy ischemia. Left in place to be reevaluated in 24-48 hrs after revascularization. Vascular team noted 1-2cm square area of capsular tear of inferior pole of spleen which I controlled with cautery and topical hemostatic agents     Estimated Blood Loss: No blood loss documented.     Specimen(s) Removed/Disposition:   ID Type Source Tests Collected by Time Destination   1 : Abdominal Fluid Fluid Abdomen CULTURE-ANAEROBIC, CULTURE-WOUND/TISSUE/FLUID(AEROBIC ONLY)W/SENSITIVITY, CULTURE-TB (AFB) (Canceled), CULTURE-FUNGAL,OTHER, LEUKEMIA/LYMPHOMA PANEL, CYTOLOGY NON-GYN Deatra Canter, DO 10/18/2023 1249    2 : small bowel Tissue Bowel Contents SURGICAL PATHOLOGY Deatra Canter, DO 10/18/2023 1305    A :  Blood Arterial BLOOD GASES, ARTERIAL, LACTIC ACID (BG - RAPID LACTATE), POTASSIUM, BG, SODIUM,BG, Stacy Gardner, DO 10/18/2023 1328    B :  Blood Arterial BLOOD GASES, ARTERIAL Deatra Canter, DO 10/18/2023 1328    C :  Blood Venous HEMOGLOBIN & HEMATOCRIT Deatra Canter, DO 10/18/2023 1328    D :  Blood Capillary POC GLUCOSE Deatra Canter, DO 10/18/2023 1328    E :  Blood Venous PROTIME INR (PT) Deatra Canter, DO 10/18/2023 1328    F :  Blood Venous PTT (APTT) Deatra Canter, DO 10/18/2023 1328    G :  Blood Venous POTASSIUM Deatra Canter, DO 10/18/2023 1328    H :  Blood Arterial GLUCOSE,BG Deatra Canter, DO 10/18/2023 1328    I :  Blood Arterial HEMOGLOBIN & HEMATOCRIT, BG Deatra Canter, DO 10/18/2023 1426    J :  Blood Venous LACTIC ACID (BG - RAPID LACTATE) Deatra Canter, DO 10/18/2023 1426    K :  Blood Arterial SODIUM,BG Deatra Canter, DO 10/18/2023 1426    L :  Blood Arterial GLUCOSE,BG Deatra Canter, DO 10/18/2023 1426    M :  Blood Arterial IONIZED CALCIUM,BG Deatra Canter, DO 10/18/2023 1426    N :  Blood Arterial POTASSIUM, BG Deatra Canter, DO 10/18/2023 1426    O :  Blood Arterial BLOOD GASES, ARTERIAL Deatra Canter, DO 10/18/2023 1426    P :  Blood Arterial BLOOD GASES, ARTERIAL (Canceled) Deatra Canter, DO 10/18/2023 1520    Q :  Blood Arterial HEMOGLOBIN &  HEMATOCRIT, BG (Canceled) Deatra Canter, DO 10/18/2023 1520    R :  Blood Venous LACTIC ACID (BG - RAPID LACTATE) (Canceled) Deatra Canter, DO 10/18/2023 1520    S :  Blood Arterial GLUCOSE,BG (Canceled) Deatra Canter, DO 10/18/2023 1520    T :  Blood Arterial IONIZED CALCIUM,BG (Canceled) Deatra Canter, DO 10/18/2023 1520    U :  Blood Arterial POTASSIUM, BG (Canceled) Deatra Canter, DO 10/18/2023 1520    V :  Blood Arterial SODIUM,BG (Canceled) Deatra Canter, DO 10/18/2023 1520    W :  Blood Arterial BLOOD GASES, ARTERIAL Deatra Canter, DO 10/18/2023 1541    X :  Blood Arterial IONIZED CALCIUM,BG Deatra Canter, DO 10/18/2023 1541    Y :  Blood Arterial GLUCOSE,BG Deatra Canter, DO 10/18/2023 1541    Z :  Blood Venous LACTIC ACID (BG - RAPID LACTATE) Deatra Canter, DO 10/18/2023 1541    AA :  Blood Arterial SODIUM,BG Deatra Canter, DO 10/18/2023 1541    AB :  Blood Arterial POTASSIUM, BG Deatra Canter, DO 10/18/2023 1541 Complications:  None      Implants:   Implant Name Serial No. Manufacturer Lot No. LRB No. Used Action   GRAFT VASCULAR 40CM  STANDARD WALL THIN WALL 2 - [id number] [employer] N/A N/A 1 Implanted       Drains: Details on drains available on LDA report    Disposition:  ICU - stable    Deatra Canter, DO  Pager

## 2023-10-18 NOTE — Progress Notes
 PHYSICAL THERAPY  NOTE      Name: Blake Decker   MRN: [id number]     DOB: August 20, 1951      Age: 73 y.o.  Admission Date: 10/10/2023     LOS: 8 days     Date of Service: 10/18/2023      Patient was unavailable for PT/OT; to the OR this date.  Rehabilitation services will continue to follow and provide intervention as indicated.     Therapist: Eddie Candle, PT  Date: 10/18/2023

## 2023-10-18 NOTE — Progress Notes
 CT obtained yesterday with decreased opacification of the venous mesenteric vessels, concerning for hypoperfusion. A CTA abdomen was obtained today showing occlusion of the SMA and celiac axis stents as well as possible pneumatosis of the colon. Vascular surgery planning a possible bypass. I will plan to explore his abdomen. I spoke to his wife by phone and discussed the general steps of the procedure (exploration, possible bowel / colon resection, likely leaving in discontinuity followed by revascularization and abtherra placement). I detailed the risks of bleeding, infection, visceral injury, short gut, need for stoma, need for multiple operations. The wife consented to surgery over the phone    ESTAT for exploration    Deatra Canter, DO

## 2023-10-18 NOTE — Case Management (ED)
 Case Management Progress Note    NAME:Blake Decker                          MRN: [id number]              DOB:Jun 06, 1951          AGE: 73 y.o.  ADMISSION DATE: 10/10/2023             DAYS ADMITTED: LOS: 8 days      Today's Date: 10/18/2023    PLAN: Anticipate discharge to inpatient setting when medically stable. Pt in OR today.     Expected Discharge Date: 10/19/2023   Is Patient Medically Stable: No, Please explain: OR today  Are there Barriers to Discharge? no    INTERVENTION/DISPOSITION:  Discharge Planning    SW attended team huddle and reviewed EMR. Pt in OR today, not ready for discharge.    PT/OT consulted - recommending inpatient setting. SW attempted to meet with pt to discuss, however, pt off unit in OR. Will follow up at a later time to discuss. Per EMR, pt has been to [facility] in the past.                   Transportation              Does the Patient Need Case Management to Arrange Discharge Transport? (ex: facility, ambulance, wheelchair/stretcher, Medicaid, cab, other): No  Will the Patient Use Family Transport?: Yes  Transportation Name, Phone and Availability #1:  ([name] - [phone])  Support                 Info or Referral                 Positive SDOH Domains and Potential Barriers                   Medication Needs                                                                                                                                                         Financial                 Legal                 Other                 Discharge Disposition  Selected Continued Care - Admitted Since 10/10/2023    No services have been selected for the patient.           [name]    [employer]

## 2023-10-18 NOTE — Consults
 PALLIATIVE CARE INPATIENT NOTE     Name: Blake Decker            MRN: [id number]                DOB: May 20, 1951          Age: 73 y.o.  Admission Date: 10/10/2023             LOS: 8 days    ASSESSMENT/PLAN     Blake Decker is a 73 y.o. 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  Palliative Care consulted on 1/21 for complex medical decision making.     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 She reports patient's wife has not yet arrived. Other friends of family have come in, but all left as patient in OR. Will plan to follow up once patient out of OR and know outcome of surgery as well as wife arrives.    RECOMMENDATIONS:   GOC: TBD    PALLIATIVE CARE PLANNING     Advance Care Planning:   Identified Health Care Decision Maker -  Wife [name]   DPOA - NA  TPOPP/OSHDNAR - Full Code-Not Applicable    PC Clinic - NA    Medication safety - NA    Disposition planning - Too soon to determine    SUBJECTIVE     CC/Reason for Visit: Complex medical decision making     Additional history from: Chart, team  History of Present Illness:  73 y.o. 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    Interval History: N/A    Past Medical History:    Abnormal heart rhythms    Atrial fibrillation (HCC)    CAD (coronary artery disease)    COPD (chronic obstructive pulmonary disease) (HCC)    Dilated cardiomyopathy (HCC)    Former tobacco use    Heart disease    Hypertension    Lung disease    Obesity, Class II, BMI 35-39.9    OSA (obstructive sleep apnea)    PVD (peripheral vascular disease) (HCC)    Tobacco abuse     Surgical History:   Procedure Laterality Date    HX CORONARY STENT PLACEMENT  2014    DEBRIDEMENT WOUND DEEP 20 SQ CM OR LESS -UPPER EXTREMITY Left 09/24/2018    Performed by Dorthula Matas, MD at [facility] OR    DEBRIDEMENT WOUND MEDIUM 20 SQ CM OR LESS - UPPER EXTREMITY Left 09/26/2018    Performed by Azzie Glatter, MD  at [facility] OR    NEGATIVE PRESSURE WOUND THERAPY FOR WOUND AREA 50 SQ CM OR LESS Left 09/26/2018    Performed by Azzie Glatter, MD at [facility] OR    DEBRIDEMENT WOUND DEEP 20 SQ CM OR LESS -UPPER EXTREMITY, SECONDARY WOUND CLOSURE Left 09/29/2018    Performed by Azzie Glatter, MD at [facility] OR    DIAGNOSTIC LAPAROSCOPY, LAPAROSCOPIC ENTERECTOMY CONVERTED TO LAPAROTOMY N/A 10/10/2023    Performed by Trenda Moots, MD at [facility] OR    REOPENING RECENT LAPAROTOMY N/A 10/13/2023    Performed by Ronn Melena, MD at [facility] OR    ENTERECTOMY RESECTION AND ANASTOMOSIS N/A 10/13/2023    Performed by Ronn Melena, MD at [facility] OR     Social History     Tobacco Use    Smoking status: Former     Current packs/day: 0.00     Average packs/day: 1 pack/day for 34.0 years (34.0 ttl pk-yrs)     Types: Cigarettes     Start date: 06/09/1984     Quit date: 06/09/2018     Years since quitting: 5.3    Smokeless tobacco: Never   Vaping Use    Vaping status: Never Used   Substance Use Topics    Alcohol use: Not Currently    Drug use: Never     Social History     Social History Narrative Not on file     Occupation: Retired  Presenter, broadcasting or other:   Living situation:  Marital status: Wife Quarry manager  Children:  Significant loved ones:  Spiritual needs: Non-Denom    Family History:   No family history on file.  No family status information on file.     ROS: Review of Systems   Unable to perform ROS: Intubated     OBJECTIVE     Blood pressure 104/51, pulse 105, temperature 37 ?C (98.6 ?F), height 182.9 cm (6' 0.01), weight 109.7 kg (241 lb 13.5 oz), SpO2 93%.  Physical Exam  N/A    Lab Results:  CBC   Lab Results   Component Value Date/Time    WBC 29.80 (H) 10/16/2023 03:23 AM    HGB 14.4 10/18/2023 03:16 AM    PLTCT 403 (H) 10/18/2023 03:16 AM     Lab Results   Component Value Date/Time    NEUT 85 (H) 10/10/2023 01:50 AM    ANC 20.3 (H) 10/17/2023 01:59 AM      Chemistries   Lab Results   Component Value Date/Time    NA 151 (H) 10/18/2023 03:16 AM    K 3.5 10/18/2023 03:16 AM    BUN 33 (H) 10/18/2023 03:16 AM    CR 0.61 10/18/2023 03:16 AM    GLU 135 (H) 10/18/2023 03:16 AM     Lab Results   Component Value Date/Time    CA 9.8 10/18/2023 03:16 AM    PO4 2.5 10/18/2023 03:16 AM    ALBUMIN 3.0 (L) 10/13/2023 12:24 PM    TOTPROT 6.0 10/13/2023 12:24 PM    ALKPHOS 82 10/13/2023 12:24 PM    AST 7 10/13/2023 12:24 PM    ALT 6 (L) 10/13/2023 12:24 PM    TOTBILI 0.9 10/13/2023 12:24 PM    GFR >60 10/18/2023 03:16 AM    GFRAA >60 05/28/2019 06:06 PM        Other Pertinent Diagnostic Results:   CTA A/P 10/18/23  IMPRESSION   1. Occlusion/thrombosis of both the celiac axis and superior mesenteric  artery stents. Much of the main superior mesenteric artery is   nonopacified. Branch arteries demonstrate flow, presumably via   collaterals.    2. Recent small bowel resection. There are several mildly dilated small   bowel loops as well as a few which demonstrate mild wall thickening. There   is possible small volume pneumatosis involving the cecum and terminal   ileum surrounding the ileocecal valve. Findings can be seen with ischemia.   3. Foley catheter with balloon distended within the prostatic urethra.   Recommend repositioning.     CT C/A/P 10/17/23  IMPRESSION   CHEST:   No acute thromboembolic disease.   Opacified right lower lobe bronchi. Partial right lower lobe   consolidation. Findings are suggestive of aspiration pneumonia.   Severe coronary artery calcifications.     ABDOMEN AND PELVIS:   Interval placement of celiac and superior mesenteric artery stents.   Evaluation of arterial vasculature is limited due to timing of contrast   bolus. However, no significant opacification of the stents suggestive of   acute/subacute in-stent thrombosis. Decreased opacification of mesenteric   venous branches which is presumably from decreased arterial inflow.   Diffuse distention and dilatation of small and large bowel loops without   discrete transition point. Findings are suggestive of postoperative ileus.   However, bowel ischemia could also be present, especially given interval   small bowel resection for ischemic necrosis, though no pneumatosis or   pneumoperitoneum at this time. No evidence of anastomotic leak or   stricture.     No other new pertinent diagnostics available for review today    Other pertinent documentation and collaboration details:  Patient medication list reviewed today.  Prior notes reviewed from healthcare team members since yesterday.  Case discussed with primary team, bedside RN.    I have collaborated with Palliative Dr. Payton Spark, Blair Heys SW in the development of patient plan of care.    Nila Nephew, DNP, APRN, AGNP-C  Palliative Medicine  Available on Voalte/AMS Connect  Phone: [phone]  Nights/Weekends - Page [phone] for Palliative Care On-Call     Thank you for allowing [place] to assist in the care of this patient. Please call with questions/concerns.    Administrative:  This is a shared note. The patient and caregiver may read this note. I support patient's rights to access their medical information in an open and transparent manner. We are partners together to improve your health. If you are a patient or caregiver with concerns, please reach out to [place] by contacting the bedside nurse and asking for Palliative Care to return your call.    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

## 2023-10-19 ENCOUNTER — Inpatient Hospital Stay: Admit: 2023-10-19 | Discharge: 2023-10-19 | Payer: MEDICARE

## 2023-10-19 ENCOUNTER — Inpatient Hospital Stay: Admit: 2023-10-19 | Discharge: 2023-10-20 | Payer: MEDICARE

## 2023-10-19 ENCOUNTER — Encounter: Admit: 2023-10-19 | Discharge: 2023-10-19 | Payer: MEDICARE

## 2023-10-19 LAB — PREPARE RBC - SUNQUEST
ANTIBODY SCREEN: NEGATIVE
BLOOD EXPIRATION DATE: 202
BLOOD EXPIRATION DATE: 202
CODING STATUS: 510
CODING STATUS: 510
ISSUE DATE TIME: 202
ISSUE DATE TIME: 202
PRODUCT CODE: 0
PRODUCT CODE: 0
UNIT DIVISION: 0
UNIT DIVISION: 0
UNITS ORDERED: 2

## 2023-10-19 LAB — CBC
~~LOC~~ BKR ADJUSTED WBC SYNCED FROM MANUAL DIFF: 18 — ABNORMAL HIGH (ref 4.50–11.00)
~~LOC~~ BKR HEMATOCRIT: 41 % — ABNORMAL HIGH (ref 40.0–50.0)
~~LOC~~ BKR HEMATOCRIT: 41 % (ref 40.0–50.0)
~~LOC~~ BKR HEMOGLOBIN: 13 g/dL — ABNORMAL LOW (ref 13.5–16.5)
~~LOC~~ BKR MCH: 27 pg — ABNORMAL HIGH (ref 26.0–34.0)
~~LOC~~ BKR MCH: 28 (ref 26.0–34.0)
~~LOC~~ BKR MCHC: 31 g/dL — ABNORMAL LOW (ref 32.0–36.0)
~~LOC~~ BKR MCHC: 32 (ref 32.0–36.0)
~~LOC~~ BKR MCV: 88 fL — ABNORMAL HIGH (ref 80.0–100.0)
~~LOC~~ BKR MCV: 86 fL (ref 80.0–100.0)
~~LOC~~ BKR MPV: 8.4 fL — ABNORMAL HIGH (ref 7.0–11.0)
~~LOC~~ BKR MPV: 8.4 (ref 7.0–11.0)
~~LOC~~ BKR PLATELET COUNT: 261 10*3/uL — ABNORMAL LOW (ref 150–400)
~~LOC~~ BKR PLATELET COUNT: 254 (ref 150–400)
~~LOC~~ BKR RBC COUNT: 4.6 10*6/uL — ABNORMAL HIGH (ref 4.40–5.50)
~~LOC~~ BKR RBC COUNT: 4.8 10*6/uL (ref 4.40–5.50)
~~LOC~~ BKR RDW: 15 % — ABNORMAL HIGH (ref 11.0–15.0)
~~LOC~~ BKR RDW: 16 — ABNORMAL HIGH (ref 11.0–15.0)
~~LOC~~ BKR WBC COUNT: 13 10*3/uL — ABNORMAL HIGH (ref 4.50–11.00)

## 2023-10-19 LAB — ALT (SGPT): ~~LOC~~ BKR ALT: 194 U/L — ABNORMAL HIGH (ref 7–56)

## 2023-10-19 LAB — TYPE & CROSSMATCH
~~LOC~~ BKR ANTIBODY SCREEN: NEGATIVE
~~LOC~~ BKR UNITS ORDERED: 2

## 2023-10-19 LAB — MAGNESIUM: ~~LOC~~ BKR MAGNESIUM: 2 mg/dL — ABNORMAL LOW (ref 1.6–2.6)

## 2023-10-19 LAB — BASIC METABOLIC PANEL
~~LOC~~ BKR ANION GAP: 12 10*3/uL — ABNORMAL HIGH (ref 3–12)
~~LOC~~ BKR ANION GAP: 12 (ref 3–12)
~~LOC~~ BKR BLD UREA NITROGEN: 56 mg/dL — ABNORMAL HIGH (ref 7–25)
~~LOC~~ BKR BLD UREA NITROGEN: 67 mg/dL — ABNORMAL HIGH (ref 7–25)
~~LOC~~ BKR CALCIUM: 8 mg/dL — ABNORMAL LOW (ref 8.5–10.6)
~~LOC~~ BKR CALCIUM: 8.3 mg/dL — ABNORMAL LOW (ref 8.5–10.6)
~~LOC~~ BKR CHLORIDE: 110 mmol/L (ref 98–110)
~~LOC~~ BKR CO2: 22 mmol/L — ABNORMAL LOW (ref 21–30)
~~LOC~~ BKR CO2: 22 mmol/L (ref 21–30)
~~LOC~~ BKR CREATININE: 1.4 mg/dL — ABNORMAL HIGH (ref 0.40–1.24)
~~LOC~~ BKR CREATININE: 1.8 mg/dL — ABNORMAL HIGH (ref 0.40–1.24)
~~LOC~~ BKR GLOMERULAR FILTRATION RATE (GFR): >60 mL/min — ABNORMAL HIGH (ref >60–100)
~~LOC~~ BKR GLOMERULAR FILTRATION RATE (GFR): 51 mL/min — ABNORMAL LOW (ref >60–5.50)
~~LOC~~ BKR GLOMERULAR FILTRATION RATE (GFR): 39 mL/min — ABNORMAL LOW (ref >60)
~~LOC~~ BKR GLUCOSE, RANDOM: 214 mg/dL — ABNORMAL HIGH (ref 70–100)
~~LOC~~ BKR GLUCOSE, RANDOM: 133 mg/dL — ABNORMAL HIGH (ref 70–100)
~~LOC~~ BKR POTASSIUM: 3.9 mmol/L — ABNORMAL LOW (ref 3.5–5.1)
~~LOC~~ BKR POTASSIUM: 4.1 mmol/L — ABNORMAL LOW (ref 3.5–5.1)
~~LOC~~ BKR POTASSIUM: 3.8 mmol/L (ref 3.5–5.1)
~~LOC~~ BKR SODIUM, SERUM: 144 mmol/L — ABNORMAL HIGH (ref 137–147)
~~LOC~~ BKR SODIUM, SERUM: 144 mmol/L (ref 137–147)

## 2023-10-19 LAB — PROTIME INR (PT)
~~LOC~~ BKR INR: 1.5 — ABNORMAL HIGH (ref 0.9–1.2)
~~LOC~~ BKR PROTIME: 16 s — ABNORMAL HIGH (ref 10.2–12.9)

## 2023-10-19 LAB — BLOOD GASES, ARTERIAL
~~LOC~~ BKR BASE DEFICIT-ART: 2.2 mmol/L
~~LOC~~ BKR BASE DEFICIT-ART: 2.5 mmol/L
~~LOC~~ BKR BICARB, ART(CAL): 22 mmol/L (ref 21.0–28.0)
~~LOC~~ BKR BICARB, ART(CAL): 22 mmol/L (ref 21.0–28.0)
~~LOC~~ BKR FIO2 VALUE-ART: 50 %
~~LOC~~ BKR FIO2 VALUE-ART: 50 %
~~LOC~~ BKR O2 SAT-ART: 93 % — ABNORMAL LOW (ref 95.0–99.0)
~~LOC~~ BKR O2 SAT-ART: 99 % (ref 95.0–99.0)
~~LOC~~ BKR PCO2-ART: 42 mmHg (ref 35–45)
~~LOC~~ BKR PCO2-ART: 35 mmHg (ref 35–45)
~~LOC~~ BKR PH-ART: 7.3 (ref 7.35–7.45)
~~LOC~~ BKR PH-ART: 7.4 (ref 7.35–7.45)
~~LOC~~ BKR PO2-ART: 74 mmHg — ABNORMAL LOW (ref 80–100)
~~LOC~~ BKR PO2-ART: 142 mmHg — ABNORMAL HIGH (ref 80–100)

## 2023-10-19 LAB — POC GLUCOSE
~~LOC~~ BKR POC GLUCOSE: 160 mg/dL — ABNORMAL HIGH (ref 70–100)
~~LOC~~ BKR POC GLUCOSE: 169 mg/dL — ABNORMAL HIGH (ref 70–100)

## 2023-10-19 LAB — PTT (APTT)
~~LOC~~ BKR PTT: 58 s — ABNORMAL HIGH (ref 24.0–36.5)
~~LOC~~ BKR PTT: 64 s — ABNORMAL HIGH (ref 24.0–36.5)

## 2023-10-19 LAB — ALK PHOS TOTAL: ~~LOC~~ BKR ALK PHOSPHATASE: 100 U/L (ref 25–110)

## 2023-10-19 LAB — AST (SGOT): ~~LOC~~ BKR AST: 273 U/L — ABNORMAL HIGH (ref 7–40)

## 2023-10-19 MED ORDER — PHENYLEPHRINE HCL IN 0.9% NACL 20 MG/250 ML (80 MCG/ML) IV SOLN (STD CONC)
0-3 ug/kg/min | INTRAVENOUS | 0 refills | Status: DC
Start: 2023-10-19 — End: 2023-10-19
  Administered 2023-10-19: 16:00:00 0.05 ug/kg/min via INTRAVENOUS

## 2023-10-19 MED ORDER — PHENYLEPHRINE HCL IN 0.9% NACL 20 MG/250 ML (80 MCG/ML) IV SOLN (STD CONC)
0-3 ug/kg/min | INTRAVENOUS | 0 refills | Status: AC
Start: 2023-10-19 — End: ?

## 2023-10-19 MED ORDER — FENTANYL CITRATE (PF) 50 MCG/ML IJ SOLN
25-50 ug | INTRAVENOUS | 0 refills | Status: DC | PRN
Start: 2023-10-19 — End: 2023-10-21
  Administered 2023-10-19 – 2023-10-21 (×5): 50 ug via INTRAVENOUS

## 2023-10-19 MED ORDER — LACTATED RINGERS IV SOLP
1000 mL | INTRAVENOUS | 0 refills | Status: CP
Start: 2023-10-19 — End: ?

## 2023-10-19 MED ORDER — LACTATED RINGERS IV SOLP
1000 mL | INTRAVENOUS | 0 refills | Status: CP
Start: 2023-10-19 — End: ?
  Administered 2023-10-19: 09:00:00 1000 mL via INTRAVENOUS

## 2023-10-19 MED ORDER — MAGNESIUM SULFATE IN WATER 4 GRAM/50 ML (8 %) IV PGBK
4 g | Freq: Once | INTRAVENOUS | 0 refills | Status: CP
Start: 2023-10-19 — End: ?
  Administered 2023-10-19: 17:00:00 4 g via INTRAVENOUS

## 2023-10-19 MED ORDER — MICAFUNGIN 100MG/100ML NS IVPB (MB+)
100 mg | INTRAVENOUS | 0 refills | Status: CP
Start: 2023-10-19 — End: ?
  Administered 2023-10-19 – 2023-10-25 (×14): 100 mg via INTRAVENOUS

## 2023-10-19 MED ORDER — HEPARIN, PORCINE (PF) 5,000 UNIT/0.5 ML IJ SYRG
5000 [IU] | SUBCUTANEOUS | 0 refills | Status: DC
Start: 2023-10-19 — End: 2023-10-23
  Administered 2023-10-19 – 2023-10-23 (×11): 5000 [IU] via SUBCUTANEOUS

## 2023-10-19 MED ORDER — DEXTROSE 50 % IN WATER (D50W) IV SYRG
12.5-25 g | INTRAVENOUS | 0 refills | Status: DC | PRN
Start: 2023-10-19 — End: 2023-10-26

## 2023-10-19 MED ORDER — AMIODARONE IN DEXTROSE,ISO-OSM 150 MG/100 ML (1.5 MG/ML) IV SOLN
150 mg | Freq: Once | INTRAVENOUS | 0 refills | Status: CP
Start: 2023-10-19 — End: ?

## 2023-10-19 MED ORDER — INSULIN ASPART 100 UNIT/ML SC FLEXPEN
0-12 [IU] | Freq: Before meals | SUBCUTANEOUS | 0 refills | Status: DC
Start: 2023-10-19 — End: 2023-10-26
  Administered 2023-10-24: 17:00:00 2 [IU] via SUBCUTANEOUS

## 2023-10-19 MED ORDER — ADULT PN CONTINUOUS-ION BASED
INTRAVENOUS | 0 refills | Status: CP
Start: 2023-10-19 — End: ?
  Administered 2023-10-20: 03:00:00 2160.0000 mL via INTRAVENOUS

## 2023-10-19 MED ORDER — AMIODARONE IN DEXTROSE,ISO-OSM 360 MG/200 ML (1.8 MG/ML) IV SOLN
1 mg/min | INTRAVENOUS | 0 refills | Status: DC
Start: 2023-10-19 — End: 2023-10-25
  Administered 2023-10-19: 20:00:00 1 mg/min via INTRAVENOUS

## 2023-10-19 MED ORDER — AMIODARONE IN DEXTROSE,ISO-OSM 360 MG/200 ML (1.8 MG/ML) IV SOLN
.5 mg/min | INTRAVENOUS | 0 refills | Status: DC
Start: 2023-10-19 — End: 2023-10-25
  Administered 2023-10-20 – 2023-10-25 (×10): 0.5 mg/min via INTRAVENOUS

## 2023-10-19 MED ORDER — PHENYLEPHRINE 320 MCG/ML IV DRIP (QUAD CONC)
0-3 ug/kg/min | INTRAVENOUS | 0 refills | Status: DC
Start: 2023-10-19 — End: 2023-10-21
  Administered 2023-10-19: 17:00:00 3 ug/kg/min via INTRAVENOUS
  Administered 2023-10-19: 23:00:00 1.8 ug/kg/min via INTRAVENOUS
  Administered 2023-10-19: 17:00:00 3 ug/kg/min via INTRAVENOUS
  Administered 2023-10-19: 23:00:00 1.8 ug/kg/min via INTRAVENOUS
  Administered 2023-10-20 (×2): 1.3 ug/kg/min via INTRAVENOUS

## 2023-10-19 MED ADMIN — AMIODARONE IN DEXTROSE,ISO-OSM 150 MG/100 ML (1.5 MG/ML) IV SOLN [307602]: 150 mg | INTRAVENOUS | @ 15:00:00 | Stop: 2023-10-19 | NDC 43066015010

## 2023-10-19 MED ADMIN — LACTATED RINGERS IV SOLP [4318]: 1000 mL | INTRAVENOUS | @ 17:00:00 | Stop: 2023-10-19 | NDC 00338011704

## 2023-10-19 MED ADMIN — AMIODARONE IN DEXTROSE,ISO-OSM 360 MG/200 ML (1.8 MG/ML) IV SOLN [307603]: 1 mg/min | INTRAVENOUS | @ 15:00:00 | Stop: 2023-10-19 | NDC 43066036020

## 2023-10-19 NOTE — Progress Notes
 Nutrition Support Service (NSS) Progress Note      PN therapy start date: 10/13/2023  PN Indication: NPO for 7 days or more (to date or expected)   Estimated PN therapy end date: unknown, when EN vs PO feasible and meeting at least 65% estimated needs consistently     Diet Order: NPO  TF: n/a  Pertinent allergies/intolerances: No known    I/O:  10571 ml/ 3510 ml  NGT: 1700 ml  UOP: 1310 ml BM x2   Abd Wound: 500 ml    Meds: Scheduled Meds:acetaminophen (OFIRMEV) 1,000 mg injection 100 mL, 1,000 mg, Intravenous, Q6H*  amitriptyline/gabapentin/baclofen(#) 2/6/2 % topical cream, , Topical, Q8H  aspirin rectal suppository 300 mg, 300 mg, Rectal, QDAY  cefTRIAXone (ROCEPHIN) IVP 2 g, 2 g, Intravenous, Q24H*  heparin (porcine) PF syringe 5,000 Units, 5,000 Units, Subcutaneous, Q8H  insulin aspart (U-100) (NOVOLOG FLEXPEN U-100 INSULIN) injection PEN 0-12 Units, 0-12 Units, Subcutaneous, ACHS (22)  lidocaine (LIDODERM) 5 % topical patch 2 patch, 2 patch, Topical, QDAY  magnesium sulfate   4 g/50 mL IVPB, 4 g, Intravenous, ONCE  metroNIDAZOLE (FLAGYL) 500 mg IVPB 100 mL, 500 mg, Intravenous, Q12H*  micafungin (MYCAMINE) 100 mg in sodium chloride 0.9% (NS) 100 mL IVPB (MB+), 100 mg, Intravenous, Q24H*  pantoprazole (PROTONIX) injection 40 mg, 40 mg, Intravenous, QDAY  SODIUM CHLORIDE 0.9% IV SOLP (Cabinet Override), , , NOW    Continuous Infusions:   Adult Continuous Parenteral Nutrition (PN)      Adult Continuous Parenteral Nutrition (PN) 90 mL/hr at 10/19/23 0530    amiodarone (CORDARONE) 360 mg/200 mL D5W IV drip (std conc) (premade) 1 mg/min (10/19/23 1341)    Followed by    amiodarone (CORDARONE) 360 mg/200 mL D5W IV drip (std conc) (premade)      phenylephrine (NEO-SYNEPHRINE) 80 mg in sodium chloride 0.9% 250 mL IV drip (quad conc) 1 mcg/kg/min (10/19/23 1411)    propofoL (DIPRIVAN) 10 mg/mL IV drip 40 mcg/kg/min (10/19/23 1320)    vasopressin (VASOSTRICT) 20 units in sodium chloride 0.9% (NS) 100 mL IV infusion (std conc)(premade) 1.8 Units/hr (10/19/23 1000)     PRN and Respiratory Meds:albuterol-ipratropium Q4H PRN, dextrose 50% (D50) IV PRN, fentaNYL citrate PF Q1H PRN, methocarbamoL Q8H PRN, [DISCONTINUED] ondansetron Q6H PRN **OR** ondansetron (ZOFRAN) IV Q6H PRN    Electrolyte Treatments: 80 mEq IV KCl and 15 mmol IV NaPhos 1/20; 20 mEq IV KPhos 1/21, none so far 1/22    Pertinent Labs:   Comprehensive Metabolic Profile    Lab Results   Component Value Date/Time    NA 144 10/19/2023 04:39 AM    K 4.1 10/19/2023 04:39 AM    CL 110 10/19/2023 04:39 AM    CO2 22 10/19/2023 04:39 AM    GAP 12 10/19/2023 04:39 AM    BUN 56 (H) 10/19/2023 04:39 AM    CR 1.46 (H) 10/19/2023 04:39 AM    GLU 214 (H) 10/19/2023 04:39 AM    Lab Results   Component Value Date/Time    CA 8.0 (L) 10/19/2023 04:39 AM    PO4 4.8 (H) 10/19/2023 04:39 AM    ALBUMIN 3.0 (L) 10/13/2023 12:24 PM    TOTPROT 6.0 10/13/2023 12:24 PM    ALKPHOS 100 10/19/2023 04:39 AM    AST 273 (H) 10/19/2023 04:39 AM    ALT 194 (H) 10/19/2023 04:39 AM    TOTBILI 0.9 10/13/2023 12:24 PM    GFR 51 (L) 10/19/2023 04:39 AM    GFRAA >60 05/28/2019  06:06 PM        Lab Results   Component Value Date    MG 2.0 10/19/2023     Lab Results   Component Value Date    TRIG 96 10/18/2023     Lab Results   Component Value Date    GLUPOC 160 (H) 10/19/2023       Admit Weight (Kg): Weight: 117.9 kg (260 lb)   Current Weight (Kg): Weight: 109.7 kg (241 lb 13.5 oz)  TPN Dose Weight (Kg): 83.3 kg (desired wt at BMI=24.9)    Estimated Kcal Needs:2275 (RMR per Modified [employer] (SU); equivalent to ~27 kcal/kg DBW 83.3 kg)  Estimated Protein Needs:125-140g  (1.5-1.7 g/kg)    Malnutrition Details:  Malnutrition present on admission  ICD-10 code E44: Acute illness/Moderate non-severe malnutrition  Energy Intake: Less than 75% of estimated energy requirement for greater than 7days, Weight loss: 1-2% x 1 week            Loss of Subcutaneous Fat: No      Muscle Wasting: No                   Malnutrition Interventions: TPN.     A/P:   Blake Decker is a 73 y.o. male with a PMH that includes HTN, Afib, CAD s/p PCI w/ stent (2014), dilated cardiomyopathy, PVD, COPD, OSA, tobaccoism who presented with increasing abdominal pain. 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  Now, s/p re-opening of recent laparotomy, enterectomy resection and anastomosis on 1/16.  Started on PN 1/16 due to current nutrition status and anticipated continued NPO status.  (Refer to NSS consult 1/16 for nutrition/wt hx details).      CT obtained 1/20 with decreased opacification of the venous mesenteric vessels, concerning for hypoperfusion. A CTA abdomen was obtained 1/21 showing occlusion of the SMA and celiac axis stents as well as possible pneumatosis of the colon. E-stat to OR 1/21 for reopening laparotomy, found to have failure of small bowel anastomosis, s/p resection, aortomesenteric bypass to hepatic and SMA with 12x6 bifurcated graft; left in discontinuity with abthera placemeent.  Plan to reevaluate 1/23 for revascularization.     1/22:  Pt now intubated, started on propofol infusion,  currently at 40 mcg/kg/min (28.3 ml/hr= 747 kcals/24 hrs) , remains NPO with NGT to LIWS with 1700 ml over past 24 hrs. Requiring   Day 7 of central PN (noted PN held for ~3.5 hrs overnight related to central access issues per RN report).   Pt with improved hypernatremia past 24 hrs; adding back small amount sodium to PN bag (~1/8 NS equivalent).  Continuing with current PN volume for now--continuing to monitor with additional fluids from meds with critical status.  Labs reviewed.  Pt with increase Creatinine post-op, as well as slightly elevated phos and K+ trending up.  Noted drop in CO2 as well with Cl remaining at upper limits of normal.  Making adjustments to provide more acetate, in next PN adjusting potassium, phosphorus, and sodium as well based on lab trends.  With current propofol infusion, removing SMOF lipids for now as propofol rate appears to be on upward trend.  Monitoring LFT's.  Noted elevated blood glucose past 24 hrs, 150-231 mg/dl--primary team added [facility] insulin to med regimen.      PN Changes (to start  at 20:30):  Increased  Na+ from 0 to 42 mEq  Decreased  K+ from 110 to 90 mEq  Decreased phosphorus from 47 to 37 mmol  Adjusted chloride:acetate ratio from 1:1 to 1:3.   Removed SMOF lipids.      PN Order (to start at 20:30):  Nonstandard TPN: 90 ml/hr x 24hr (2160 ml total volume)    Macronutrients: AA  130g (goal), Dex 280 g (goal), SMOFlipids 0 ml (goal 315 ml--off propofol)    Lytes: Na 42 mEq, K 90 mEq, Ca 10 mEq, Mg 24 mEq, Phos 37 mmol, 1:3 chloride:acetate ratio  Additives: 10 ml multivitamin, 1ml trace elements, and 100mg  thiamine     PN to provide 1472 kcals* (~17.7 kcals/kg; ~65% estimated needs), 130 g AA (1.56 g/kg, 100% estimated needs)  *additional kcals from propofol    [place] Pink, RD, LD, CNSC  Available on Voalte Me

## 2023-10-19 NOTE — Progress Notes
 Daily Progress Note      Date of Service:  10/19/2023  Name:  Blake Decker                       MRN:  [id number]   Admission Date: 10/10/2023 (LOS: 9 days)                     Assessment: Blake Decker is a 73 y.o. 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 s/p lap SBR, abthera placement (1/13) s/p celiac and SMA stent placement via L radial access (1/14) s/p anastomosis, abdominal closure (1/16) c/b stent thrombosis s/p aorto-SMA hepatic bypass, SBR, splenorrhaphy, Abthera (1/21)    Principal Problem:    Superior mesenteric artery stenosis (HCC)  Active Problems:    Moderate malnutrition (HCC)    Abdominal pain, generalized    Diarrhea    Mesenteric ischemia (HCC)    Atrial fibrillation (HCC)    Acute blood loss anemia    Severe sepsis with septic shock (HCC)    S/P small bowel resection    Leukocytosis, unspecified      Plan:  - Afib with RVR overnight, SICU team transitioned to phenyl and off levo today and started amio with improvement in rate  - Management of sedation, pressors, and vent wean per SICU team  - Continue ASA. Hep gtt is not necessary from a vascular standpoint however, patient is chronically anticoagulated   - Currently in discontinuity with Abthera in place, SICU to return to the OR in the next 24 hrs. Abthera output SS at this time.   - Please page [phone] with questions, concerns, or changes in clinical status.     Discussed with Dr. Lourdes Sledge  ________________________________________________________    Subjective:  Afib with RVR overnight. Requiring high dose pressors as well.     Objective:  BP: (104-129)/(51-80)   ABP: (86-162)/(40-64)   Temp:  [36.4 ?C (97.6 ?F)-37.7 ?C (99.9 ?F)]   Pulse:  [96-134]   Respirations:  [18 PER MINUTE-33 PER MINUTE]   SpO2:  [92 %-100 %]   O2%:  [40 %-100 %]   O2 Device: Ventilator  Body mass index is 32.79 kg/m?Marland Kitchen    Lab Results   Component Value Date/Time    HGB 13.1 (L) 10/19/2023 04:39 AM    HCT 40.8 10/19/2023 04:39 AM    WBC 27.00 (H) 10/19/2023 04:39 AM    PLTCT 230 10/19/2023 04:39 AM    INR 1.5 (H) 10/18/2023 07:01 PM     Lab Results   Component Value Date/Time    NA 144 10/19/2023 04:39 AM    K 4.1 10/19/2023 04:39 AM    CL 110 10/19/2023 04:39 AM    CO2 22 10/19/2023 04:39 AM    BUN 56 (H) 10/19/2023 04:39 AM    CR 1.46 (H) 10/19/2023 04:39 AM    MG 2.0 10/19/2023 04:39 AM    PO4 4.8 (H) 10/19/2023 04:39 AM    CA 8.0 (L) 10/19/2023 04:39 AM     Lab Results   Component Value Date/Time    GLUPOC 160 (H) 10/19/2023 10:28 AM          Physical Exam  General: Intubated and sedated. Did not open his eyes during exam  Head: Normocephalic, without obvious abnormality, atraumatic. NGT in place with bilious output  Lungs: Mechanically ventilated. Synchronous with vent. Symmetric chest rise  Heart: Regular rate  Abdomen: Soft, distended. Abthera in place with SS output  Skin: Skin color, texture, turgor normal. No rashes or lesions.   Extremity: No clubbing, cyanosis, or edema       ICD-10 code E44: Acute illness/Moderate non-severe malnutrition  Energy Intake: Less than 75% of estimated energy requirement for greater than 7days, Weight loss: 1-2% x 1 week        Loss of Subcutaneous Fat: No      Muscle Wasting: No             Malnutrition Interventions: Nutrition assessment, avoid prolonged NPO status, monitor nutrition plans.    Active Wounds          Wounds Surgical incision Medial Abdomen (Active)   10/13/23 1144   Wound Type: Surgical incision   Orientation: Medial   Location: Abdomen   Wound Location Comments:    Initial Wound Site Closure: Sutures;Staples   Initial Dressing Placed: Negative pressure therapy (wound VAC dressing)   Initial Cycle:    Initial Suction Setting (mmHg):    Pressure Injury Stages:    Pressure Injury Present Within 24 Hours of Hospital Admission:    If This Pressure Injury Is Suspected to Be Device Related, Please Select the Device::    Is the Wound Open or Closed:    Wound Assessment Dressing not removed for assessment 10/19/23 0400   Wound Site Closure Staples 10/18/23 1745   Peri-wound Assessment Dry;Pale 10/19/23 0400   Wound Drainage Amount None 10/19/23 0400   Wound Drainage Description Serosanguineous 10/19/23 0400   Wound Dressing Status Intact 10/19/23 0400   Wound Care Dressing changed or new application 10/16/23 0800   Wound Dressing and/or Treatment Negative pressure therapy 10/19/23 0400   Cycle Continuous 10/19/23 0400   Suction Setting (mmHg) -125 mmHg 10/19/23 0400   Drain Output (mL) 150 ml 10/19/23 0400   Number of days: 6                                              ___________________________    Scherry Ran, MD   Service Pager: # [phone]

## 2023-10-19 NOTE — Unmapped
 Brief Op Note    Date: 10/19/2023 6:20 AM   Preoperative Dx: Need for central venous access vs hypovolemic shock vs hemorrhagic   Postoperative Dx: Same       Procedure: Right internal jugular triple lumen catheter exchange for 16 Fr     Primary Surgeon: Brent General SICU staff this evening   Assistant: Jonette Eva, MD    Indications:  Need for central venous access     The indications, risks, benefits, and alternatives to the procedure were discussed with the patient's family member and they elected to proceed. A time out was performed verifying the correct patient, procedure, and side of procedure. The patient was positioned in trendelenburg and the internal jugular vein and wire was visualized with an ultrasound device. Next, the patient's right neck, chest, and shoulder were prepped and draped in usual sterile fashion. A guide wire was advanced into the vein and the catheter exchanged for a 20 Fr. An xray was obtained which revealed the catheter was too long and one of the lumens would not flush or draw. A guide wire was advanced into the vein and the catheter exchanged for a 16 Fr   All three ports flushed and aspirated easily. The catheter was then secured with suture and a biopatch was applied. A sterile dressing was placed over this. Placement was confirmed with a CXR which showed no pneumothorax.     Dr. Tami Ribas was immediately available for the procedure.    Jonette Eva, MD

## 2023-10-19 NOTE — Care Coordination-Inpatient
 BRIEF PALLIATIVE CARE UPDATE    Discussed with bedside nurse.  Patient's wife had left the bedside for a few hours.  Patient was in A-fib with RVR overnight, pressor requirements improved with improvement in heart rate. He is in discontinuity and will likely return to the OR tomorrow.  Our team remains available to assist with goals of care discussions as needed.  Please do not hesitate to reach out if we can be of assistance.    Leodis Liverpool, MD  Palliative Care   *[phone]  Team pager *[phone]  Also on [facility] and AMSConnect

## 2023-10-19 NOTE — Progress Notes
 Adult Mechanical Ventilator Liberation    Name: Blake Decker   MRN: [id number]     DOB: December 08, 1950      Age: 73 y.o.  Admission Date: 10/10/2023     LOS: 9 days     Date of Service: 10/19/2023        Adult Mechanical Ventilator Liberation: Twice daily     Weaning Readiness Screen Met (RT Only):: Yes  Initial Weaning Minute Volume (L/min): 12.6 L/min  Initial Weaning Respiratory Rate: 40 breaths/min  Initial Weaning Tidal Vol (mL)  (Calc.): 315 mL  Initial RSBI (Calculated): 127  Spontaneous Breathing Trial Completed (RT Only): No, unstable RR (> or equal to 20% variation of previous hour);No, unstable BP (< or equal to 60 mm Hg MAP or > or equal to 180 mm Hg systolic) (patient became tachypneic with RR going into the 40's. Placed patient back on settings.)

## 2023-10-19 NOTE — Progress Notes
 PHYSICAL THERAPY  NOTE      Name: Naithen Rivenburg   MRN: [id number]     DOB: 1950/12/14      Age: 73 y.o.  Admission Date: 10/10/2023     LOS: 9 days     Date of Service: 10/19/2023      Patient is currently on bedrest due to open abdomen and unable to participate in PT/OT. Rehabilitation services will continue to follow and provide intervention as indicated.     Therapist: Eddie Candle, PT  Date: 10/19/2023

## 2023-10-19 NOTE — Progress Notes
 Adult Mechanical Ventilator Liberation    Name: Blake Decker   MRN: [id number]     DOB: 1950-12-11      Age: 73 y.o.  Admission Date: 10/10/2023     LOS: 9 days     Date of Service: 10/19/2023        Adult Mechanical Ventilator Liberation: Twice daily     Weaning Readiness Screen Met (RT Only):: Yes  SAT Safety Screen (Nursing Only): Pass-Perform spontaneous awakening trial  Initial Weaning Minute Volume (L/min): 16.7 L/min  Initial Weaning Respiratory Rate: 32 breaths/min  Initial Weaning Tidal Vol (mL)  (Calc.): 522 mL  Initial RSBI (Calculated): 61  Spontaneous Breathing Trial Completed (RT Only): Yes (Twice Daily)  Post Weaning Minute Volume (L/min): 15.8 L/min  Post Weaning Respiratory Rate: 32 breaths/min  Post Weaning Tidal Vol (mL)  (Calc.): 494 mL  NIF Ventilated: -19 cm H2O  $$ Vital Capacity (mL): 0 ml (shaking head no, RR climbed to 40s on 0/0 for test)  Post RSBI (Calculated): 65  RSBI Ratio: 6.56  Spontaneous Breathing Trial Successful (Twice Daily): No, VC < or equal to 10 mL/kg IBW    Trial completed on 10/5. Pt with increased RR 30-40s on 5/5.

## 2023-10-19 NOTE — Progress Notes
 END OF SHIFT SUMMARY [facility]28    Admission Date: 10/10/2023  Length of Stay: LOS: 9 days        Acute events and nursing interventions: new CVC line placed. Prop and vaso titrated to maintain MAP >65.       Communication with providers (include name/title): Dr. Carlynn Purl and Dr. Briant Cedar      Did patient sleep overnight? N/A      Patient demeanor and cognition: Calm      Pain  Was the patient's pain controlled this shift? Yes PRN morphine given x1  If no, what new interventions were implemented?            Is the patient intubated? Yes   Did patient get a breathing trial? Yes   Did patient pass their breathing trial? No Increase in RR with belly breathing and HTN      Patient Education  Patient education provided: Fall bundle and prevention and Mechanical ventilation  Other patient education provided:  Learners: Patient  Method/materials used: Verbal teaching  Response to learning: No Evidence of Learning    Patient Goal(s):  Patient will Improve nutritional intake by discharge date

## 2023-10-19 NOTE — Progress Notes
 Surgical Critical Care Progress Note    Today's Date: 10/19/2023   Hospital Day: Hospital Day: 10    History of Present Illness:   Blake Decker is a 73 y.o. 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 and anastomotic leak s/p ex lap, SBR, left in discontinuity, aorto-mesenteric hepatic and SMA bypass with bifurcated graft, cauterization of iatrogenic splenic capsular tear, Abthera placement (1/21)     Patient Active Problem List    Diagnosis Date Noted    Leukocytosis, unspecified 10/14/2023    S/P small bowel resection 10/12/2023    Moderate malnutrition (HCC) 10/11/2023    Abdominal pain, generalized 10/11/2023    Diarrhea 10/11/2023    Mesenteric ischemia (HCC) 10/11/2023    Atrial fibrillation (HCC) 10/11/2023    Acute blood loss anemia 10/11/2023    Severe sepsis with septic shock (HCC) 10/11/2023    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    Arm pain, left 10/04/2018    Acute respiratory failure with hypoxia (HCC) 09/25/2018    Severe sepsis (HCC) 09/25/2018    Olecranon bursitis of left elbow 09/25/2018    Cellulitis 09/23/2018    Atrial fibrillation with RVR (HCC) 09/23/2018    Coronary artery disease involving native coronary artery 09/23/2018    COPD (chronic obstructive pulmonary disease) (HCC) 09/23/2018    OSA (obstructive sleep apnea) 09/23/2018       Assessment & Plan:   Neuro   Acute pain due to trauma  Multimodal pain regimen:  - Tylenol IV 1000mg  q6h   - Robaxin 1000 mg iv q8h prn   - Lidocaine topical patch daily  - Fentanyl prn    Anxiety  - PTA venlafaxine and buspar held due to being in discontinuity     GCS 11T (E4,V1,M6)    CV Undifferentiated shock versus hypovolemic versus septic, resolved, HTN, Afib (Xarelto), CAD s/p PCI w/ stent (2014), dilated cardiomyopathy, angio SMA, hepatic artery, celiac stent (1/14) c/b occlusion of celiac and SMA stent s/p  aorto-mesenteric bypass to hepatic and SMA with bifurcated graft (1/21)   Tachycardia (95-132)  - Tachycardic to 120's, A fib, continue to monitor. MAP > 65 SBP < 180.   - PTA antihypertensives held due to pressor requirements  - PTA xarelto held.   - Rectal ASA 300mg  due to being in discontinuity   - PTA Metoprolol tartrate 50 BID held    - Metop IV 5mg  q6h was discontinued due to pressor requirement  - Amiodarone bolus and drip initiated due to Afib/RVR sustaining in 130's  - Digoxin 125 mcg IV discontinued because started amiodarone  - Heparin gtt discontinued, not needed for bypass per vascular  - Wean levophed and start phenylephrine for specific alpha adrenergic activity  - Mag 4g   - Per vasc okay for xarelto and ASA at discharge   - CTA chest obtained 1/20 without PE, there is evidence of aspiration pneumonia, rocephin started    Pulm  Acute hypoxic respiratory failure,   Hx of COPD, OSA, tobaccoism   Mode: PS/CPAP  Expired Tidal Volume Spont (mL):  [354 milliliters]   Total Respiratory Rate (Breaths/Min):  [40 breaths/minutes]   Minute Volume (L/min):  [12.6 liters/minutes]   %MVspon:  [  100 %]   PIP Actual:  [11 cm H20]   PEEP/CPAP:  [5 cm H2O]   PSupport:  [5 cm H20]   Mean Airway Pressure:  [7 cm H2O]      V/AC, TV: 500, RR (acutal): 29, PEEP: 5    SBT: RSBI 127, no NIF obtained due to inability for patient to complete trial due to tachypnea to 40's    GI/ Fluids / Electrolytes / Nutrition  Risk for constipation, Risk for electrolyte imbalance, chronic mesenteric ischemia, SMA stenosis s/p ex lap, SBR, in discontinuity ,abthera placement (1/13) s/p ex lap, primary anastomosis, abdominal wall closure (1/16) c/b leak from small bowel anastomosis s/p ex-lap SBR, cauterization of iatrogenic splenic capsular tear, left in discontinuity with abthera placement (1/21)  - NPO   - Protonix 40 mg daily  - Home lasix 40 mg every morning currently held  - Bowel regimen: suppositories BID, had 9 BM charted  - TPN running, continue   - Zofran prn nausea  - Abthera output SS  - NGT output 1700 bilious gastric contents    Interval imaging through hospital course  - CT abdomen/pelvis from 1/20: without significant opacification of the stents suggestive of acute/subacute in-stent thrombosis. There also appears to be diffuse distension of the small and large bowel suggestive of ileus vs potential bowel ischemia  - Obtained CTA abdomen/pelvis on 1/21 with confirmed thrombosis of both celiac and SMA stents and pneumatosis will ESTAT to OR with vascular surgery for bypass, ex lap, possible SBR, possible ostomy, consent was obtained from patient's wife tina     GU    - Cr 1.46 (1.01),  UOP 1310,  0.3 ml/kg/hr  - Fluid status 24 hours: +6972 ml Since Admit: +5324  - Maintain foley     Heme/ID  Acute blood loss anemia  - Hgb stable. No clinical signs of overt bleeding. Continue to trend serially. Optimize fluid balance. Monitor stools for signs of occult GI bleeding.    Leukocytosis  - WBC 27 (18),  Afebrile.   - Cultures 1/13: Urine, no growth. Fecal rotavirus, cryptosporidium, cdiff negative   - Blood cultures 1/20: NGTD  - Intraoperative Wound cultures 1/21: rare budding yeast  - ABX: on flagyl and rocephin for suspected RLL PNA and until obtain source control from anastomotic leak, started micafungin due to budding yeast on wound cultures     Endo  - Starting MDCF due to hyperglycemia of 214     MS  Impaired mobility and activities of daily living  - PT/OT  - NWB LLE (fibular fracture repaired PTA), discussed with ortho on call  - Patient due for staple removal and replacement of splint per his orthopaedic surgeon at [facility], will reach back out to ortho team here to evaluate for splint exchange and staple removal    Daily Quality Checklist  Lines:  3 Peripheral IV   R IJ central line placed 1/14, required re-wiring and replacement x2 overnight on 1/22  Foley Cath  NGT  Activity progressive mobility but NWB LLE   GI Prophylaxis: protonix  DVT Prophylaxis: heparin subQ  Nutrition: NPO, TPN  Bowel Regimen: none at this time  Lab Frequency: Daily   Daily CXR: No  Disposition: SICU    Subjective    Intubated and sedated    Objective:    Physical Exam:  General: sedated  HEENT: Normocephalic, atraumatic; mucus membranes moist, NGT in place with gastric contents, ETT in place  Lungs: on ventilator  Heart: A fib, tachycardic to 130's  Abdomen:  soft, abthera in place holding suction with SS output  Extremities:  No edema; Warm, without cyanosis, LLE in boot from fibula fracture (surgery 12/31)    Labs:    Complete Blood Counts   Recent Labs     10/18/23  0316 10/18/23  1728 10/18/23  1901 10/19/23  0439   HGB 14.4 13.1* 13.7 13.1*   HCT 43.6 41.3 41.9 40.8   WBC  --  13.30*  --  27.00*   PLTCT 403* 261 254 230   MCV 86.9 88.0 86.8 86.2   MCH 28.6 27.9 28.4 27.8   MCHC 32.9 31.7* 32.7 32.2   RDW 15.4* 15.9* 16.0* 15.5*   MPV 8.5 8.4 8.4 8.4     Recent Labs     10/17/23  0159   LYMPH 1*   MONO 13*   PLTEST Slight Increase          Chemistry Panel   Recent Labs     10/17/23  0159 10/18/23  0316 10/18/23  1328 10/18/23  1426 10/18/23  1541 10/18/23  1728 10/19/23  0439   NA 148* 151*  --  147 148* 149* 144   K 3.2* 3.5  --  4.0 4.1 3.9 4.1   CL 102 110  --   --   --  115* 110   CO2 32* 29  --   --   --  24 22   BUN 34* 33*  --   --   --  41* 56*   CR 0.60 0.61  --   --   --  1.01 1.46*   GLU 123* 135*   < > 208* 231* 150* 214*   GAP 14* 12  --   --   --  10 12   GFR >60 >60  --   --   --  >60 51*   MG 1.9 2.2  --   --   --  2.0 2.0   CA 9.5 9.8  --   --   --  8.2* 8.0*   PO4 2.1 2.5  --   --   --  5.1* 4.8*    < > = values in this interval not displayed.     No results for input(s): ALKPHOS, AST, ALT, TOTPROT, TOTBILI, ALBUMIN, AMYLASE, LIPASE in the last 72 hours.       Coagulation Studies   Recent Labs     10/18/23  1901 10/19/23  0208   PTT 58.2* 64.0*   INR 1.5*  --           Vital Signs (24 hours)  BP: (104-192)/(51-97)   ABP: (86-162)/(40-64)   Temp:  [36.4 ?C (97.6 ?F)-37.7 ?C (99.9 ?F)]   Pulse:  [96-134]   Respirations:  [18 PER MINUTE-33 PER MINUTE]   SpO2:  [92 %-100 %]   O2%:  [40 %-100 %]   O2 Device: Ventilator    Medications  Scheduled Meds:acetaminophen (OFIRMEV) 1,000 mg injection 100 mL, 1,000 mg, Intravenous, Q6H*  amitriptyline/gabapentin/baclofen(#) 2/6/2 % topical cream, , Topical, Q8H  aspirin rectal suppository 300 mg, 300 mg, Rectal, QDAY  cefTRIAXone (ROCEPHIN) IVP 2 g, 2 g, Intravenous, Q24H*  lidocaine (LIDODERM) 5 % topical patch 2 patch, 2 patch, Topical, QDAY  metroNIDAZOLE (FLAGYL) 500 mg IVPB 100 mL, 500 mg, Intravenous, Q12H*  pantoprazole (PROTONIX) injection 40 mg, 40 mg, Intravenous, QDAY    Continuous Infusions:   Adult Continuous Parenteral  Nutrition (PN) 90 mL/hr at 10/19/23 0530    amiodarone (CORDARONE) 360 mg/200 mL D5W IV drip (std conc) (premade) 1 mg/min (10/19/23 0903)    Followed by    amiodarone (CORDARONE) 360 mg/200 mL D5W IV drip (std conc) (premade)      norepinephrine (LEVOPHED) 4 mg in dextrose 5% (D5W) 250 mL IV drip (std conc) 0.08 mcg/kg/min (10/19/23 0856)    propofoL (DIPRIVAN) 10 mg/mL IV drip 25 mcg/kg/min (10/19/23 0904)    vasopressin (VASOSTRICT) 20 units in sodium chloride 0.9% (NS) 100 mL IV infusion (std conc)(premade) 1.8 Units/hr (10/19/23 0108)     PRN and Respiratory Meds:albuterol-ipratropium Q4H PRN, methocarbamoL Q8H PRN, morphine  injection syringe Q4H PRN, [DISCONTINUED] ondansetron Q6H PRN **OR** ondansetron (ZOFRAN) IV Q6H PRN      I have personally reviewed pertinent labs, medications, radiology, and diagnostic procedures including: active problem list, medication list, allergies, family history, social history, health maintenance, notes from last encounter, lab results, imaging.     Lanae Boast, MD  Electronically Signed  10/19/2023 9:05 AM  Available on [employer]

## 2023-10-20 ENCOUNTER — Encounter: Admit: 2023-10-20 | Discharge: 2023-10-20 | Payer: MEDICARE

## 2023-10-20 ENCOUNTER — Inpatient Hospital Stay: Admit: 2023-10-20 | Discharge: 2023-10-20 | Payer: MEDICARE

## 2023-10-20 LAB — BLOOD GASES, ARTERIAL
~~LOC~~ BKR BASE DEFICIT-ART: 5.8 mmol/L (ref 0.2–1.3)
~~LOC~~ BKR BICARB, ART(CAL): 19 mmol/L — ABNORMAL LOW (ref 21.0–28.0)
~~LOC~~ BKR FIO2 VALUE-ART: 100 % — ABNORMAL HIGH (ref 7–40)
~~LOC~~ BKR O2 SAT-ART: 95 % — ABNORMAL LOW (ref 95.0–99.0)
~~LOC~~ BKR PCO2-ART: 32 mmHg — ABNORMAL LOW (ref 35–45)
~~LOC~~ BKR PCO2-ART: 58 mmHg — ABNORMAL HIGH (ref 35–45)
~~LOC~~ BKR PH-ART: 7.4 mg/dL (ref 7.35–7.45)
~~LOC~~ BKR PO2-ART: 104 mmHg — ABNORMAL HIGH (ref 80–100)

## 2023-10-20 LAB — CULTURE-WOUND/TISSUE/FLUID(AEROBIC ONLY)W/SENSITIVITY

## 2023-10-20 LAB — COMPREHENSIVE METABOLIC PANEL
~~LOC~~ BKR ALT: 511 U/L — ABNORMAL HIGH (ref 7–56)
~~LOC~~ BKR ANION GAP: 13 — ABNORMAL HIGH (ref 3–12)
~~LOC~~ BKR BLD UREA NITROGEN: 83 mg/dL — ABNORMAL HIGH (ref 7–25)
~~LOC~~ BKR CO2: 20 mmol/L — ABNORMAL LOW (ref 21–30)
~~LOC~~ BKR CREATININE: 2.2 mg/dL — ABNORMAL HIGH (ref 0.40–1.24)
~~LOC~~ BKR GLOMERULAR FILTRATION RATE (GFR): 31 mL/min — ABNORMAL LOW (ref >60)

## 2023-10-20 LAB — CBC
~~LOC~~ BKR HEMATOCRIT: 39 % — ABNORMAL LOW (ref 40.0–50.0)
~~LOC~~ BKR HEMATOCRIT: 40 % — ABNORMAL HIGH (ref 40.0–50.0)
~~LOC~~ BKR MCV: 86 fL — ABNORMAL HIGH (ref 80.0–100.0)
~~LOC~~ BKR RBC COUNT: 4.6 10*6/uL (ref 4.40–5.50)
~~LOC~~ BKR WBC COUNT: 21 10*3/uL — ABNORMAL HIGH (ref 4.50–11.00)
~~LOC~~ BKR WBC COUNT: 24 10*3/uL — ABNORMAL HIGH (ref 4.50–11.00)

## 2023-10-20 LAB — POC GLUCOSE
~~LOC~~ BKR POC GLUCOSE: 120 mg/dL — ABNORMAL HIGH (ref 70–100)
~~LOC~~ BKR POC GLUCOSE: 136 mg/dL — ABNORMAL HIGH (ref 70–100)
~~LOC~~ BKR POC GLUCOSE: 99 mg/dL (ref 70–100)

## 2023-10-20 LAB — SURGICAL PATHOLOGY

## 2023-10-20 LAB — MAGNESIUM
~~LOC~~ BKR MAGNESIUM: 3.4 mg/dL — ABNORMAL HIGH (ref 1.6–2.6)
~~LOC~~ BKR MAGNESIUM: 3.3 mg/dL — ABNORMAL HIGH (ref 1.6–2.6)

## 2023-10-20 LAB — PHOSPHORUS: ~~LOC~~ BKR PHOSPHORUS: 6.4 mg/dL — ABNORMAL HIGH (ref 2.0–4.5)

## 2023-10-20 LAB — LACTIC ACID(LACTATE): ~~LOC~~ BKR LACTIC ACID: 0.9 mmol/L (ref 0.5–2.0)

## 2023-10-20 MED ORDER — ADULT PN CONTINUOUS-ION BASED
INTRAVENOUS | 0 refills | Status: DC
Start: 2023-10-20 — End: 2023-10-20

## 2023-10-20 MED ORDER — PHENYLEPHRINE HCL IN 0.9% NACL 1 MG/10 ML (100 MCG/ML) IV SYRG
INTRAVENOUS | 0 refills | Status: DC
Start: 2023-10-20 — End: 2023-10-20

## 2023-10-20 MED ORDER — ADULT PN CONTINUOUS-ION BASED
INTRAVENOUS | 0 refills | Status: CP
Start: 2023-10-20 — End: ?
  Administered 2023-10-21: 03:00:00 1440.0000 mL via INTRAVENOUS

## 2023-10-20 MED ORDER — FENTANYL CITRATE (PF) 50 MCG/ML IJ SOLN
INTRAVENOUS | 0 refills | Status: DC
Start: 2023-10-20 — End: 2023-10-20

## 2023-10-20 MED ORDER — ARTIFICIAL TEARS (PF) SINGLE DOSE DROPS GROUP
OPHTHALMIC | 0 refills | Status: DC
Start: 2023-10-20 — End: 2023-10-20

## 2023-10-20 MED ORDER — ROCURONIUM 10 MG/ML IV SOLN
INTRAVENOUS | 0 refills | Status: DC
Start: 2023-10-20 — End: 2023-10-20

## 2023-10-20 MED ORDER — SODIUM CHLORIDE 0.9% IV SOLP
INTRAVENOUS | 0 refills | Status: DC
Start: 2023-10-20 — End: 2023-10-20

## 2023-10-20 NOTE — Progress Notes
 PALLIATIVE CARE INPATIENT NOTE     Name: Blake Decker            MRN: [id number]                DOB: Dec 30, 1950          Age: 73 y.o.  Admission Date: 10/10/2023             LOS: 10 days    ASSESSMENT/PLAN     Blake Decker is a 73 y.o. 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  Palliative Care consulted on 1/21 for complex medical decision making.     #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  #S/p small bowel resection  #S/p abthera placement  #SMA and celiac stenosis, s/p stents  #Thrombosis of SMA and celiac stents   #Possible pneumatosis of the colon   - Vasc SGE consulted   - 1/21 - Emergently to OR for for SMA/hepatic bypass  - 1/23 - back to the OR, improved appearance of small bowel, resected 4 cm portion, left in discontinuity due to pressor requirement  #Afib with RVR  #Leukocytosis  #High NGT output    #Goals of Care/Complex Medical Decision Making related to acute abdomen  Discussion:   Palliative care team met with patient's wife and brother at bedside.  We introduced the role of the palliative care team.  They shared that Blake Decker is a stubborn guy and that he has not been very healthy lately.  His brother described him as a couch potato since retirement.  His wife reports he likes to work on classic cars.  Brother also made a comment that he did not think Blake Decker would want to live with a lot of tubes.  They understand that his clinical course is still evolving.  His wife is clearly worried about him and appropriately tearful at times.  Blake Decker's two children from a previous marriage will be getting back from vacation tomorrow and she is looking forward to the added support. Discussed with SICU team.    RECOMMENDATIONS:   Likely back to OR on Saturday  Palliative care will continue to follow and assist with goals of care discussions as needed    PALLIATIVE CARE PLANNING     Advance Care Planning:   Identified Health Care Decision Maker -  Wife [name]   DPOA - NA  TPOPP/OSHDNAR - Full Code-Not Applicable    PC Clinic - NA    Medication safety - NA    Disposition planning - Too soon to determine    SUBJECTIVE     CC/Reason for Visit: Complex medical decision making   Additional history from: Chart, team  Interval events: Went back to the OR today.  On 2 pressors.  Prior to the OR he was interactive on the vent, passed SBT.  He did not respond to conversation in the room today during our visit.    OBJECTIVE     Blood pressure 129/58, pulse 83, temperature 37.6 ?C (99.7 ?F), height 182.9 cm (6' 0.01), weight 109.7 kg (241 lb 13.5 oz), SpO2 95%.  Physical Exam  PHYSICAL EXAM  Vital signs reviewed.  General: Older gentleman, intubated, comfortable appearing  HEENT: ET tube in place, NG tube in place.  Normocephalic and atraumatic.   Pulm:  Synchronous with the ventilator, no accessory muscle use  Extremeties: No cyanosis or clubbing.   Skin: No rashes on exposed skin.   Neuro: No myoclonus or tremor.   Psych: No agitation or restlessness    Lab Results:  CBC   Lab Results   Component Value Date/Time    WBC 24.60 (H) 10/20/2023 10:28 AM    HGB 12.5 (L) 10/20/2023 10:28 AM    PLTCT 175 10/20/2023 10:28 AM     Lab Results   Component Value Date/Time    NEUT 85 (H) 10/10/2023 01:50 AM    ANC 20.3 (H) 10/17/2023 01:59 AM      Chemistries   Lab Results   Component Value Date/Time    NA 142 10/20/2023 10:28 AM    K 4.1 10/20/2023 10:28 AM    BUN 83 (H) 10/20/2023 10:28 AM    CR 2.24 (H) 10/20/2023 10:28 AM    GLU 111 (H) 10/20/2023 10:28 AM     Lab Results   Component Value Date/Time    CA 8.1 (L) 10/20/2023 10:28 AM    PO4 7.6 (H) 10/20/2023 10:28 AM    ALBUMIN 2.3 (L) 10/20/2023 10:28 AM    TOTPROT 5.1 (L) 10/20/2023 10:28 AM    ALKPHOS 124 (H) 10/20/2023 10:28 AM AST 715 (H) 10/20/2023 10:28 AM    ALT 511 (H) 10/20/2023 10:28 AM    TOTBILI 0.8 10/20/2023 10:28 AM    GFR 31 (L) 10/20/2023 10:28 AM    GFRAA >60 05/28/2019 06:06 PM        Other Pertinent Diagnostic Results:   Reviewed    High medical decision making due to the following:  1 or more chronic illness with progression and 1 acute or chronic illness that poses a threat to life or bodily function  Review of notes outside of my specialty, Review of each unique test, and Assessment requiring an independent historian and discussion of management or test interpretation with Drs. Tami Ribas and [employer] (physician(s) or other qualified health care professional outside of my specialty)    Leodis Liverpool, MD  Palliative Care   [phone]  Team pager [phone]  Also on [employer] and [employer]    Note created with voice dictation software, please excuse grammatical and typographical errors.    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

## 2023-10-20 NOTE — Unmapped
 Brief Operative Note    Name: Blake Decker is a 73 y.o. male     DOB: 03-02-1951             MRN#: [id number]  DATE OF OPERATION: 10/20/2023    Billing area: SICU    Date:  10/20/2023        Preoperative Dx:   Mesenteric ischemia (HCC) [K55.9]    Post-op Diagnosis      * Mesenteric ischemia (HCC) [K55.9]    Procedure(s):  REOPENING RECENT LAPAROTOMY, enterectomy    Surgeons and Role:     * Beryl Meager, Lorin Picket, DO - Primary     * Lanae Boast, MD - Resident - Assisting      Findings:  significant improvement in appearance of small bowel. A total of  ~ 200 cm of bowel remaining.    ~ 90 cm proximal to the cecum was the area of previous patchy necrosis that we had oversewn. This repair appeared intact. Proximal to this area the bowel was pink and healthy. Distal staple line healthy in appearance.    40-50 cm of bowel is present from the ligament of trietz to the proximal staple line. The staple line had some necrosis and we resected it ~ 4 cm of bowel. This proximal jejunum had 2 areas of hematoma vs patchy necrosis that we left. If resected he will need a duodenojejunostomy at the time of his reconstruction.     Remains in discontinuity as he was on pressors. Abtherra replaced     Estimated Blood Loss: No blood loss documented.     Specimen(s) Removed/Disposition:   ID Type Source Tests Collected by Time Destination   1 : Small Bowel Tissue Bowel Contents SURGICAL PATHOLOGY Deatra Canter, DO 10/20/2023 2130        Complications:  None      Implants: * No implants in log *    Drains: Details on drains available on LDA report    Disposition:  ICU - stable    Deatra Canter, DO  Pager

## 2023-10-20 NOTE — Progress Notes
 END OF SHIFT SUMMARY [facility]    Admission Date: 10/10/2023  Length of Stay: LOS: 10 days        Acute events and nursing interventions: On/off phenyl, ETT repositioned, new art line placed      Communication with providers (include name/title): Dr. Roe Rutherford      Did patient sleep overnight? N/A (sedated on propofol)      Patient demeanor and cognition: following commands      Pain  Was the patient's pain controlled this shift? Yes  If no, what new interventions were implemented?            Is the patient intubated? Yes   Did patient get a breathing trial? Yes   Did patient pass their breathing trial? No-  RSBI Ratio > or equal to 20, anxiety with sedation off      Patient Education  Patient education provided: Mechanical ventilation and Pain management  Other patient education provided:  Learners: Patient  Method/materials used: Verbal teaching  Response to learning: No Evidence of Learning    Patient Goal(s):  Patient will Achieve timely healing by discharge date

## 2023-10-20 NOTE — Progress Notes
 Surgical Critical Care Progress Note    Today's Date: 10/20/2023   Hospital Day: Hospital Day: 11    History of Present Illness:   Blake Decker is a 73 y.o. 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 and anastomotic leak s/p ex lap, SBR, left in discontinuity, aorto-mesenteric hepatic and SMA bypass with bifurcated graft, cauterization of iatrogenic splenic capsular tear, Abthera placement (1/21)     Patient Active Problem List    Diagnosis Date Noted    Leukocytosis, unspecified 10/14/2023    S/P small bowel resection 10/12/2023    Moderate malnutrition (HCC) 10/11/2023    Abdominal pain, generalized 10/11/2023    Diarrhea 10/11/2023    Mesenteric ischemia (HCC) 10/11/2023    Atrial fibrillation (HCC) 10/11/2023    Acute blood loss anemia 10/11/2023    Severe sepsis with septic shock (HCC) 10/11/2023    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    Arm pain, left 10/04/2018    Acute respiratory failure with hypoxia (HCC) 09/25/2018    Severe sepsis (HCC) 09/25/2018    Olecranon bursitis of left elbow 09/25/2018    Cellulitis 09/23/2018    Atrial fibrillation with RVR (HCC) 09/23/2018    Coronary artery disease involving native coronary artery 09/23/2018    COPD (chronic obstructive pulmonary disease) (HCC) 09/23/2018    OSA (obstructive sleep apnea) 09/23/2018       Assessment & Plan:   Neuro   Acute pain due to trauma  Multimodal pain regimen:  - Tylenol IV 1000mg  q6h   - Robaxin 1000 mg iv q8h prn   - Lidocaine topical patch daily  - Fentanyl prn    Anxiety  - PTA venlafaxine and buspar held due to being in discontinuity     GCS 11T (E4,V1,M6)    CV Undifferentiated shock versus hypovolemic versus septic, resolved, HTN, Afib (Xarelto), CAD s/p PCI w/ stent (2014), dilated cardiomyopathy, angio SMA, hepatic artery, celiac stent (1/14) c/b occlusion of celiac and SMA stent s/p  aorto-mesenteric bypass to hepatic and SMA with bifurcated graft (1/21)   Tachycardia (95-132)  - Tachycardia improved, rate controlled   - MAP > 65 SBP < 180.   - PTA antihypertensives held due to pressor requirements  - PTA xarelto held.   - Rectal ASA 300mg  due to being in discontinuity   - PTA Metoprolol tartrate 50 BID held    - Metop IV 5mg  q6h was discontinued due to pressor requirement  - Continue amiodarone, providing adequate rate control  - Digoxin 125 mcg IV discontinued because started amiodarone  - Heparin gtt discontinued, not needed for bypass per vascular  - Continue Phenylephrine for specific alpha adrenergic activity   - Per vasc okay for xarelto and ASA at discharge   - RTOR today     - CTA chest obtained 1/20 without PE, there is evidence of aspiration pneumonia, rocephin started 1/20    Pulm  Acute hypoxic respiratory failure,   Hx of COPD, OSA, tobaccoism   Mode: PS/CPAP  Set Vt (ml):  [500 milliliters]   Expired Tidal Volume Spont (mL):  [305 milliliters-578 milliliters]   Set RR:  [18 breaths/minutes]   Total Respiratory Rate (Breaths/Min):  [26 breaths/minutes-32  breaths/minutes]   Minute Volume (L/min):  [12.9 liters/minutes-16.9 liters/minutes]   %MVspon:  [0 %-100 %]   PIP Actual:  [16 cm H20-24 cm H20]   PEEP/CPAP:  [5 cm H2O]   PSupport:  [10 cm H20]   Mean Airway Pressure:  [8 cm H2O-11 cm H2O]      V/AC, TV: 500, RR (acutal): 26, PEEP: 5    SBT: RSBI 61, NIF-23, following commands, remained intubated despite passing trial due to RTOR this morning    GI/ Fluids / Electrolytes / Nutrition  Risk for constipation, Risk for electrolyte imbalance, chronic mesenteric ischemia, SMA stenosis s/p ex lap, SBR, in discontinuity ,abthera placement (1/13) s/p ex lap, primary anastomosis, abdominal wall closure (1/16) c/b leak from small bowel anastomosis s/p ex-lap SBR, cauterization of iatrogenic splenic capsular tear, left in discontinuity with abthera placement (1/21)  - NPO   - Protonix 40 mg daily  - PTA lasix 40 mg currently held  - Bowel regimen: suppositories BID, had BM x2  - TPN running, continue   - Zofran prn nausea  - Abthera output SS  - NGT output 1200 bilious gastric contents    Interval imaging through hospital course  - CT abdomen/pelvis from 1/20: without significant opacification of the stents suggestive of acute/subacute in-stent thrombosis. There also appears to be diffuse distension of the small and large bowel suggestive of ileus vs potential bowel ischemia  - Obtained CTA abdomen/pelvis on 1/21 with confirmed thrombosis of both celiac and SMA stents and pneumatosis will ESTAT to OR with vascular surgery for bypass, ex lap, possible SBR, possible ostomy, consent was obtained from patient's wife Blake Decker     GU    - Cr 2.12 (1.46),  UOP 799,  0.55 ml/kg/hr  - Fluid status 24 hours: +4135 ml Since Admit: +9548  - Maintain foley     Heme/ID  Acute blood loss anemia  - Hgb stable. No clinical signs of overt bleeding. Continue to trend serially. Optimize fluid balance. Monitor stools for signs of occult GI bleeding.    Leukocytosis  - WBC 21 (27),  Afebrile.   - Cultures 1/13: Urine, no growth. Fecal rotavirus, cryptosporidium, cdiff negative   - Blood cultures 1/20: NGTD  - Intraoperative Wound cultures 1/21: moderate candida glabrata, heavy bacteroides thetaiotaomicron   - ABX: on flagyl and rocephin for suspected RLL PNA and until obtain source control from anastomotic leak, started micafungin due to budding yeast on wound cultures     Endo  - [facility]     MS  Impaired mobility and activities of daily living  - PT/OT  - NWB LLE (fibular fracture repaired PTA), discussed with ortho on call  - Patient due for staple removal and replacement of splint per his orthopaedic surgeon at [facility], ortho team evaluated and would like [facility] attending to view picture in chart and confirm removal, patient's family would like to establish care here    Daily Quality Checklist  Lines:  3 Peripheral IV   R IJ central line placed 1/14, required re-wiring and replacement x2 overnight on 1/22  Foley Cath  NGT  Activity progressive mobility but NWB LLE   GI Prophylaxis: protonix  DVT Prophylaxis: heparin subQ  Nutrition: NPO, TPN  Bowel Regimen: none at this time  Lab Frequency: Daily   Daily CXR: No  Disposition: SICU    Subjective    Intubated and sedated    Objective:    Physical Exam:  General: sedated  HEENT: Normocephalic, atraumatic;  mucus membranes moist, NGT in place with gastric contents, ETT in place  Lungs: on ventilator    Heart: A fib, tachycardic to 130's  Abdomen:  soft, abthera in place holding suction with SS output  Extremities:  No edema; Warm, without cyanosis, LLE in boot from fibula fracture (surgery 12/31)    Labs:    Complete Blood Counts   Recent Labs     10/18/23  1728 10/18/23  1901 10/19/23  0439 10/20/23  0338   HGB 13.1* 13.7 13.1* 13.0*   HCT 41.3 41.9 40.8 39.2*   WBC 13.30*  --  27.00* 21.80*   PLTCT 261 254 230 172   MCV 88.0 86.8 86.2 86.9   MCH 27.9 28.4 27.8 28.8   MCHC 31.7* 32.7 32.2 33.2   RDW 15.9* 16.0* 15.5* 16.1*   MPV 8.4 8.4 8.4 9.2     No results for input(s): NEUT, LYMPH, MONO, POIK, OVAL, PLTEST in the last 72 hours.    Invalid input(s): EOSINOPHIL         Chemistry Panel   Recent Labs     10/18/23  0316 10/18/23  1328 10/18/23  1728 10/19/23  0439 10/19/23  1506 10/20/23  0338   NA 151*   < > 149* 144 144 140   K 3.5   < > 3.9 4.1 3.8 4.1   CL 110  --  115* 110 110 109   CO2 29  --  24 22 22  18*   BUN 33*  --  41* 56* 67* 80*   CR 0.61  --  1.01 1.46* 1.84* 2.12*   GLU 135*   < > 150* 214* 133* 129*   GAP 12  --  10 12 12  13*   GFR >60  --  >60 51* 39* 33*   MG 2.2  --  2.0 2.0  --  3.4*   CA 9.8  --  8.2* 8.0* 8.3* 8.1* PO4 2.5  --  5.1* 4.8*  --  6.4*    < > = values in this interval not displayed.     Recent Labs     10/19/23  0439 10/20/23  0338   ALKPHOS 100 137*   AST 273* 663*   ALT 194* 444*   TOTPROT  --  4.6*   TOTBILI  --  0.9   ALBUMIN  --  2.3*          Coagulation Studies   Recent Labs     10/18/23  1901 10/19/23  0208 10/19/23  0846   PTT 58.2* 64.0* 59.2*   INR 1.5*  --   --           Vital Signs (24 hours)  ABP: (89-160)/(41-62)   Temp:  [37.3 ?C (99.1 ?F)-38.1 ?C (100.6 ?F)]   Pulse:  [73-118]   Respirations:  [21 PER MINUTE-32 PER MINUTE]   SpO2:  [92 %-98 %]   O2%:  [40 %]   O2 Device: Ventilator    Medications  Scheduled Meds:acetaminophen (OFIRMEV) 1,000 mg injection 100 mL, 1,000 mg, Intravenous, Q6H*  amitriptyline/gabapentin/baclofen(#) 2/6/2 % topical cream, , Topical, Q8H  aspirin rectal suppository 300 mg, 300 mg, Rectal, QDAY  cefTRIAXone (ROCEPHIN) IVP 2 g, 2 g, Intravenous, Q24H*  heparin (porcine) PF syringe 5,000 Units, 5,000 Units, Subcutaneous, Q8H  insulin aspart (U-100) (NOVOLOG FLEXPEN U-100 INSULIN) injection PEN 0-12 Units, 0-12 Units, Subcutaneous, ACHS (22)  lidocaine (LIDODERM) 5 % topical patch 2 patch, 2 patch, Topical, QDAY  metroNIDAZOLE (FLAGYL) 500 mg IVPB 100 mL, 500 mg, Intravenous, Q12H*  micafungin (MYCAMINE) 100 mg in sodium chloride 0.9% (NS) 100 mL IVPB (MB+), 100 mg, Intravenous, Q24H*  pantoprazole (PROTONIX) injection 40 mg, 40 mg, Intravenous, QDAY    Continuous Infusions:   Adult Continuous Parenteral Nutrition (PN) 90 mL/hr at 10/19/23 2051    amiodarone (CORDARONE) 360 mg/200 mL D5W IV drip (std conc) (premade) 0.5 mg/min (10/20/23 0044)    phenylephrine (NEO-SYNEPHRINE) 80 mg in sodium chloride 0.9% 250 mL IV drip (quad conc) 1.1 mcg/kg/min (10/20/23 0646)    propofoL (DIPRIVAN) 10 mg/mL IV drip 40 mcg/kg/min (10/20/23 0444)    vasopressin (VASOSTRICT) 20 units in sodium chloride 0.9% (NS) 100 mL IV infusion (std conc)(premade) 1.8 Units/hr (10/19/23 2054)     PRN and Respiratory Meds:albuterol-ipratropium Q4H PRN, dextrose 50% (D50) IV PRN, fentaNYL citrate PF Q1H PRN, methocarbamoL Q8H PRN, [DISCONTINUED] ondansetron Q6H PRN **OR** ondansetron (ZOFRAN) IV Q6H PRN      I have personally reviewed pertinent labs, medications, radiology, and diagnostic procedures including: active problem list, medication list, allergies, family history, social history, health maintenance, notes from last encounter, lab results, imaging.     Lanae Boast, MD  Electronically Signed  10/20/2023 6:53 AM  Available on [employer]

## 2023-10-20 NOTE — Progress Notes
 Adult Mechanical Ventilator Liberation    Name: Blake Decker   MRN: [id number]     DOB: Apr 07, 1951      Age: 73 y.o.  Admission Date: 10/10/2023     LOS: 10 days     Date of Service: 10/20/2023        Adult Mechanical Ventilator Liberation: Twice daily     Weaning Readiness Screen Met (RT Only):: Yes  Initial Weaning Minute Volume (L/min): 16.9 L/min  Initial Weaning Respiratory Rate: 32 breaths/min  Initial Weaning Tidal Vol (mL)  (Calc.): 528 mL  Initial RSBI (Calculated): 61  Spontaneous Breathing Trial Completed (RT Only): Yes (Twice Daily)  Post Weaning Minute Volume (L/min): 18 L/min  Post Weaning Respiratory Rate: 33 breaths/min  Post Weaning Tidal Vol (mL)  (Calc.): 545 mL  NIF Ventilated: -23 cm H2O  $$ Vital Capacity (mL): 871 ml  Post RSBI (Calculated): 61  RSBI Ratio: 0  Spontaneous Breathing Trial Successful (Twice Daily): Yes  Patient Having Procedure Requiring Intubation in the Next 12 Hours?  (Twice Daily): Yes (OR later today)    Pt completed their 30 mins SBT trial. Pt required 12/5 for extra support. Pt pulled good volumes, but required extra support for RR's in the low to mid 30's. Pt is following commands, and passed trial. RT spoke to team and wishes for pt to remain intubated for OR later today.

## 2023-10-20 NOTE — Progress Notes
 Attempted a trial.  Patient's respiratory rate maintained above 30 even with increased pressure support.  Along with that, the patient's O2 saturations continued to drop.  O2 was increased to 60% and he slowly came up and maintained in the low 90s.  Patient was placed back on vc/ac settings.    Weaning Readiness Screen Met (RT Only):: No, SpO2 <  92% with FiO2 > or equal to 0.6

## 2023-10-20 NOTE — Progress Notes
 END OF SHIFT SUMMARY [facility]    Admission Date: 10/10/2023  Length of Stay: LOS: 10 days        Acute events and nursing interventions: No acute events, patient remains on vasopressors, titrated for MAP > 65.       Communication with providers (include name/title): n/a      Did patient sleep overnight? N/A      Patient demeanor and cognition: Sedated, unable to assess cognition, follows commands x4, calm when awake.      Pain  Was the patient's pain controlled this shift? Yes  If no, what new interventions were implemented?            Is the patient intubated? Yes   Did patient get a breathing trial? Yes   Did patient pass their breathing trial? Acute events: Increased respiratory rate and desaturation. Placed back on settings shortly after going to CPAP.      Patient Education  Patient education provided: Mechanical ventilation  Other patient education provided:  Learners: Significant Other  Method/materials used: Verbal teaching  Response to learning: Some Evidence of Learning, Needs Reinforcement    Patient Goal(s):  Patient will Report pain is controlled/improved by discharge date

## 2023-10-20 NOTE — Progress Notes
 END OF SHIFT SUMMARY [facility]    Admission Date: 10/10/2023  Length of Stay: LOS: 10 days        Acute events and nursing interventions: titrated vasoactive medications to maintain MAP >65.      Communication with providers (include name/title): Dr. Briant Cedar and Dr Carlynn Purl       Did patient sleep overnight? N/A      Patient demeanor and cognition: following commands      Pain  Was the patient's pain controlled this shift? Yes  If no, what new interventions were implemented?            Is the patient intubated? Yes   Did patient get a breathing trial? Yes   Did patient pass their breathing trial? Yes      Patient Education  Patient education provided: Fall bundle and prevention, Glucose management, and Mechanical ventilation  Other patient education provided:  Learners: Patient  Method/materials used: Verbal teaching  Response to learning: No Evidence of Learning    Patient Goal(s):  Patient will Achieve timely healing by discharge date

## 2023-10-20 NOTE — Progress Notes
 Nutrition Support Service (NSS) Progress Note      PN therapy start date: 10/13/2023  PN Indication: NPO for 7 days or more (to date or expected)   Estimated PN therapy end date: unknown, when EN vs PO feasible and meeting at least 65% estimated needs consistently     Diet Order: NPO  TF: n/a  Pertinent allergies/intolerances: No known    I/O:  6455 ml/ 3539 ml  NGT: 2150 ml  UOP: 889 ml BM x2   Abd Wound: 500 ml    Meds: Scheduled Meds:acetaminophen (OFIRMEV) 1,000 mg injection 100 mL, 1,000 mg, Intravenous, Q6H*  amitriptyline/gabapentin/baclofen(#) 2/6/2 % topical cream, , Topical, Q8H  aspirin rectal suppository 300 mg, 300 mg, Rectal, QDAY  cefTRIAXone (ROCEPHIN) IVP 2 g, 2 g, Intravenous, Q24H*  heparin (porcine) PF syringe 5,000 Units, 5,000 Units, Subcutaneous, Q8H  insulin aspart (U-100) (NOVOLOG FLEXPEN U-100 INSULIN) injection PEN 0-12 Units, 0-12 Units, Subcutaneous, ACHS (22)  lidocaine (LIDODERM) 5 % topical patch 2 patch, 2 patch, Topical, QDAY  metroNIDAZOLE (FLAGYL) 500 mg IVPB 100 mL, 500 mg, Intravenous, Q12H*  micafungin (MYCAMINE) 100 mg in sodium chloride 0.9% (NS) 100 mL IVPB (MB+), 100 mg, Intravenous, Q24H*  pantoprazole (PROTONIX) injection 40 mg, 40 mg, Intravenous, QDAY    Continuous Infusions:   Adult Continuous Parenteral Nutrition (PN)      Adult Continuous Parenteral Nutrition (PN) 90 mL/hr at 10/20/23 1045    amiodarone (CORDARONE) 360 mg/200 mL D5W IV drip (std conc) (premade) 0.5 mg/min (10/20/23 1040)    phenylephrine (NEO-SYNEPHRINE) 80 mg in sodium chloride 0.9% 250 mL IV drip (quad conc) 0.6 mcg/kg/min (10/20/23 1334)    propofoL (DIPRIVAN) 10 mg/mL IV drip 30 mcg/kg/min (10/20/23 1105)    vasopressin (VASOSTRICT) 20 units in sodium chloride 0.9% (NS) 100 mL IV infusion (std conc)(premade) 1.8 Units/hr (10/20/23 0950)     PRN and Respiratory Meds:albuterol-ipratropium Q4H PRN, dextrose 50% (D50) IV PRN, fentaNYL citrate PF Q1H PRN, methocarbamoL Q8H PRN, [DISCONTINUED] ondansetron Q6H PRN **OR** ondansetron (ZOFRAN) IV Q6H PRN    Electrolyte Treatments: 4g IV Mg 1/22; none so far today    Pertinent Labs:   Comprehensive Metabolic Profile    Lab Results   Component Value Date/Time    NA 142 10/20/2023 10:28 AM    K 4.1 10/20/2023 10:28 AM    CL 109 10/20/2023 10:28 AM    CO2 20 (L) 10/20/2023 10:28 AM    GAP 13 (H) 10/20/2023 10:28 AM    BUN 83 (H) 10/20/2023 10:28 AM    CR 2.24 (H) 10/20/2023 10:28 AM    GLU 111 (H) 10/20/2023 10:28 AM    Lab Results   Component Value Date/Time    CA 8.1 (L) 10/20/2023 10:28 AM    PO4 7.6 (H) 10/20/2023 10:28 AM    ALBUMIN 2.3 (L) 10/20/2023 10:28 AM    TOTPROT 5.1 (L) 10/20/2023 10:28 AM    ALKPHOS 124 (H) 10/20/2023 10:28 AM    AST 715 (H) 10/20/2023 10:28 AM    ALT 511 (H) 10/20/2023 10:28 AM    TOTBILI 0.8 10/20/2023 10:28 AM    GFR 31 (L) 10/20/2023 10:28 AM    GFRAA >60 05/28/2019 06:06 PM        Lab Results   Component Value Date    MG 3.3 (H) 10/20/2023     Lab Results   Component Value Date    TRIG 96 10/18/2023     Lab Results   Component Value Date  GLUPOC 99 10/20/2023    GLUPOC 136 (H) 10/20/2023    GLUPOC 120 (H) 10/19/2023    GLUPOC 169 (H) 10/19/2023    GLUPOC 160 (H) 10/19/2023       Admit Weight (Kg): Weight: 117.9 kg (260 lb)   Current Weight (Kg): Weight: 109.7 kg (241 lb 13.5 oz)  TPN Dose Weight (Kg): 83.3 kg (desired wt at BMI=24.9)    Estimated Kcal Needs:2275 (RMR per Modified [employer] (SU); equivalent to ~27 kcal/kg DBW 83.3 kg)  Estimated Protein Needs:125-140g  (1.5-1.7 g/kg)    Malnutrition Details:  Malnutrition present on admission  ICD-10 code E44: Acute illness/Moderate non-severe malnutrition  Energy Intake: Less than 75% of estimated energy requirement for greater than 7days, Weight loss: 1-2% x 1 week            Loss of Subcutaneous Fat: No      Muscle Wasting: No                   Malnutrition Interventions: TPN.     A/P:   Arville Lime is a 73 y.o. male with a PMH that includes HTN, Afib, CAD s/p PCI w/ stent (2014), dilated cardiomyopathy, PVD, COPD, OSA, tobaccoism who presented with increasing abdominal pain. 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  Now, s/p re-opening of recent laparotomy, enterectomy resection and anastomosis on 1/16.  Started on PN 1/16 due to current nutrition status and anticipated continued NPO status.  (Refer to NSS consult 1/16 for nutrition/wt hx details).      CT obtained 1/20 with decreased opacification of the venous mesenteric vessels, concerning for hypoperfusion. A CTA abdomen was obtained 1/21 showing occlusion of the SMA and celiac axis stents as well as possible pneumatosis of the colon. E-stat to OR 1/21 for reopening laparotomy, found to have failure of small bowel anastomosis, s/p resection, aortomesenteric bypass to hepatic and SMA with 12x6 bifurcated graft; left in discontinuity with abthera placemeent.  Pt to OR 1/23 for reopening of recent lap with enterectomy and noted to have ~200 cm bowel remaining, remains in discontinuity with Abtherra replaced.    1/23:  Pt now intubated since 1/21 and continues on propofol,  currently at 40 mcg/kg/min (28.3 ml/hr= 747 kcals/24 hrs) , remains NPO with NGT to LIWS with 2150 ml over past 24 hrs. Pt is on vaso and phenylephrine. Requiring Day 8 of central PN (noted PN held for ~3.5 hrs overnight (1/21-1/22) related to central access issues per RN report).   With open abdomen and sterile water restrictions for TPN throughout hospital will decrease PN rate/volume.  Na+ trending down, so will increase in next PN.  Will decrease K+ slightly with worsening Cr/renal labs. Phos elevated pre and post op, along with Mg, so will remove both for now from PN.      PN Changes (to start at 20:30):  Decreased PN rate from 90 to 60 ml/hr--total volume 2160 ml to 1440 ml  Increased Na+ from 42 to 110 mEq  Decreased K+ from 90 to 70 mEq  Decreased magnesium from 24 mEq to 0 mEq  Decreased phosphorus 37 to 0 mmol  Adjusted from 1:3 to Max acetate additives     PN Order (to start at 20:30):  Nonstandard TPN: 60 ml/hr x 24hr (1440 ml total volume)  Macronutrients: AA  130g (goal), Dex 280 g (goal), SMOFlipids 0 ml (goal 315 ml--off propofol)    Lytes: Na 110 mEq, K 70 mEq, Ca 10 mEq, Mg 0 mEq, Phos 0 mmol, Max acetate  Additives: 10 ml multivitamin, 1ml trace elements, and 100mg  thiamine     PN to provide 1472 kcals* (~17.7 kcals/kg; ~65% estimated needs), 130 g AA (1.56 g/kg, 100% estimated needs)  *additional kcals from propofol    Willette Cluster, MA, RD, LD, CNSC  Available on [place]

## 2023-10-21 ENCOUNTER — Inpatient Hospital Stay: Admit: 2023-10-21 | Discharge: 2023-10-21 | Payer: MEDICARE

## 2023-10-21 LAB — CBC
~~LOC~~ BKR HEMATOCRIT: 38 — ABNORMAL LOW (ref 40.0–50.0)
~~LOC~~ BKR HEMOGLOBIN: 12 — ABNORMAL LOW (ref 13.5–16.5)
~~LOC~~ BKR MCH: 27 — ABNORMAL HIGH (ref 26.0–34.0)
~~LOC~~ BKR MCHC: 31 — ABNORMAL LOW (ref 32.0–36.0)
~~LOC~~ BKR MCV: 87 (ref 80.0–100.0)
~~LOC~~ BKR MPV: 9.2 fL — ABNORMAL HIGH (ref 7.0–11.0)
~~LOC~~ BKR PLATELET COUNT: 155 10*3/uL — ABNORMAL HIGH (ref 150–400)
~~LOC~~ BKR RBC COUNT: 4.4 — ABNORMAL HIGH (ref 4.40–5.50)
~~LOC~~ BKR RDW: 16 % — ABNORMAL HIGH (ref 11.0–15.0)
~~LOC~~ BKR WBC COUNT: 21 — ABNORMAL HIGH (ref 4.50–11.00)

## 2023-10-21 LAB — BLOOD GASES, ARTERIAL
~~LOC~~ BKR BASE DEFICIT-ART: 5.2 mmol/L
~~LOC~~ BKR BASE DEFICIT-ART: 3.2 mmol/L
~~LOC~~ BKR BICARB, ART(CAL): 20 mmol/L — ABNORMAL LOW (ref 21.0–28.0)
~~LOC~~ BKR BICARB, ART(CAL): 21 mmol/L (ref 21.0–28.0)
~~LOC~~ BKR FIO2 VALUE-ART: 60 %
~~LOC~~ BKR FIO2 VALUE-ART: 60 %
~~LOC~~ BKR O2 SAT-ART: 98 % (ref 95.0–99.0)
~~LOC~~ BKR O2 SAT-ART: 99 % (ref 95.0–99.0)
~~LOC~~ BKR PCO2-ART: 45 mmHg (ref 35–45)
~~LOC~~ BKR PCO2-ART: 38 mmHg (ref 35–45)
~~LOC~~ BKR PH-ART: 7.2 — ABNORMAL LOW (ref 7.35–7.45)
~~LOC~~ BKR PH-ART: 7.3 (ref 7.35–7.45)
~~LOC~~ BKR PO2-ART: 137 mmHg — ABNORMAL HIGH (ref 80–100)
~~LOC~~ BKR PO2-ART: 157 mmHg — ABNORMAL HIGH (ref 80–100)

## 2023-10-21 LAB — POC GLUCOSE
~~LOC~~ BKR POC GLUCOSE: 145 mg/dL — ABNORMAL HIGH (ref 70–100)
~~LOC~~ BKR POC GLUCOSE: 146 mg/dL — ABNORMAL HIGH (ref 70–100)
~~LOC~~ BKR POC GLUCOSE: 135 mg/dL — ABNORMAL HIGH (ref 70–100)
~~LOC~~ BKR POC GLUCOSE: 148 mg/dL — ABNORMAL HIGH (ref 70–100)
~~LOC~~ BKR POC GLUCOSE: 153 mg/dL — ABNORMAL HIGH (ref 70–100)

## 2023-10-21 LAB — TRIGLYCERIDE: ~~LOC~~ BKR TRIGLYCERIDES: 255 — ABNORMAL HIGH (ref 3.5–<150)

## 2023-10-21 LAB — COMPREHENSIVE METABOLIC PANEL
~~LOC~~ BKR CALCIUM: 8.1 mg/dL — ABNORMAL LOW (ref 8.5–10.6)
~~LOC~~ BKR TOTAL PROTEIN: 5.1 g/dL — ABNORMAL LOW (ref 6.0–8.0)

## 2023-10-21 MED ORDER — METOPROLOL TARTRATE 5 MG/5 ML IV SOLN
2.5 mg | INTRAVENOUS | 0 refills | Status: DC
Start: 2023-10-21 — End: 2023-10-21
  Administered 2023-10-21: 20:00:00 2.5 mg via INTRAVENOUS

## 2023-10-21 MED ORDER — LABETALOL 5 MG/ML IV SOLN
10 mg | INTRAVENOUS | 0 refills | Status: DC | PRN
Start: 2023-10-21 — End: 2023-10-21

## 2023-10-21 MED ORDER — LABETALOL 5 MG/ML IV SOLN
10 mg | INTRAVENOUS | 0 refills | Status: DC | PRN
Start: 2023-10-21 — End: 2023-10-22
  Administered 2023-10-21: 23:00:00 10 mg via INTRAVENOUS

## 2023-10-21 MED ORDER — METOPROLOL TARTRATE 5 MG/5 ML IV SOLN
5 mg | INTRAVENOUS | 0 refills | Status: DC
Start: 2023-10-21 — End: 2023-10-23
  Administered 2023-10-22 – 2023-10-23 (×7): 5 mg via INTRAVENOUS

## 2023-10-21 MED ORDER — METOPROLOL TARTRATE 5 MG/5 ML IV SOLN
2.5 mg | INTRAVENOUS | 0 refills | Status: DC
Start: 2023-10-21 — End: 2023-10-21

## 2023-10-21 MED ORDER — ADULT PN CONTINUOUS-ION BASED
INTRAVENOUS | 0 refills | Status: CP
Start: 2023-10-21 — End: ?
  Administered 2023-10-22: 02:00:00 1728.0000 mL via INTRAVENOUS

## 2023-10-21 MED ORDER — MORPHINE 2 MG/ML IV SYRG
2-4 mg | INTRAVENOUS | 0 refills | Status: DC | PRN
Start: 2023-10-21 — End: 2023-10-27
  Administered 2023-10-21 (×2): 2 mg via INTRAVENOUS
  Administered 2023-10-22 – 2023-10-23 (×6): 4 mg via INTRAVENOUS
  Administered 2023-10-25 – 2023-10-26 (×3): 2 mg via INTRAVENOUS

## 2023-10-21 MED ORDER — SODIUM BICARBONATE 2% INH SOLN
5 mL | Freq: Two times a day (BID) | RESPIRATORY_TRACT | 0 refills | Status: DC
Start: 2023-10-21 — End: 2023-10-26
  Administered 2023-10-22 – 2023-10-26 (×9): 5 mL via RESPIRATORY_TRACT

## 2023-10-21 NOTE — Progress Notes
$$   Extubation (RT only): Extubated to non-heated high flow cannula oxygen device  O2 Device: High flow nasal cannula  O2 Liter Flow: 4 Lpm              respiratory status: Stable   See RT Assessment Note for additional interventions.

## 2023-10-21 NOTE — Progress Notes
 Nutrition Support Service (NSS) Progress Note      PN therapy start date: 10/13/2023  PN Indication: NPO for 7 days or more (to date or expected)   Estimated PN therapy end date: unknown, when EN vs PO feasible and meeting at least 65% estimated needs consistently     Diet Order: NPO  TF: n/a  Pertinent allergies/intolerances: No known    I/O:  4075 ml/ 4802 ml  NGT: 1950 ml  UOP: 1652 ml BM x2   Abd Wound: 1200 ml    Meds: Scheduled Meds:acetaminophen (OFIRMEV) 1,000 mg injection 100 mL, 1,000 mg, Intravenous, Q6H*  amitriptyline/gabapentin/baclofen(#) 2/6/2 % topical cream, , Topical, Q8H  aspirin rectal suppository 300 mg, 300 mg, Rectal, QDAY  cefTRIAXone (ROCEPHIN) IVP 2 g, 2 g, Intravenous, Q24H*  heparin (porcine) PF syringe 5,000 Units, 5,000 Units, Subcutaneous, Q8H  insulin aspart (U-100) (NOVOLOG FLEXPEN U-100 INSULIN) injection PEN 0-12 Units, 0-12 Units, Subcutaneous, ACHS (22)  lidocaine (LIDODERM) 5 % topical patch 2 patch, 2 patch, Topical, QDAY  metroNIDAZOLE (FLAGYL) 500 mg IVPB 100 mL, 500 mg, Intravenous, Q12H*  micafungin (MYCAMINE) 100 mg in sodium chloride 0.9% (NS) 100 mL IVPB (MB+), 100 mg, Intravenous, Q24H*  pantoprazole (PROTONIX) injection 40 mg, 40 mg, Intravenous, QDAY  SODIUM CHLORIDE 0.9% IV SOLP (Cabinet Override), , , NOW    Continuous Infusions:   Adult Continuous Parenteral Nutrition (PN)      Adult Continuous Parenteral Nutrition (PN) 60 mL/hr at 10/20/23 2038    amiodarone (CORDARONE) 360 mg/200 mL D5W IV drip (std conc) (premade) 0.5 mg/min (10/21/23 1120)    phenylephrine (NEO-SYNEPHRINE) 80 mg in sodium chloride 0.9% 250 mL IV drip (quad conc) Stopped (10/21/23 1144)    vasopressin (VASOSTRICT) 20 units in sodium chloride 0.9% (NS) 100 mL IV infusion (std conc)(premade) Stopped (10/21/23 1144)     PRN and Respiratory Meds:albuterol-ipratropium Q4H PRN, dextrose 50% (D50) IV PRN, fentaNYL citrate PF Q1H PRN, methocarbamoL Q8H PRN, [DISCONTINUED] ondansetron Q6H PRN **OR** ondansetron (ZOFRAN) IV Q6H PRN    Electrolyte Treatments: 4g IV Mg 1/22; none 1/23 and none so far 1/24    Pertinent Labs:   Comprehensive Metabolic Profile    Lab Results   Component Value Date/Time    NA 140 10/21/2023 03:07 AM    K 4.0 10/21/2023 03:07 AM    CL 108 10/21/2023 03:07 AM    CO2 19 (L) 10/21/2023 03:07 AM    GAP 13 (H) 10/21/2023 03:07 AM    BUN 91 (H) 10/21/2023 03:07 AM    CR 1.92 (H) 10/21/2023 03:07 AM    GLU 162 (H) 10/21/2023 03:07 AM    Lab Results   Component Value Date/Time    CA 8.1 (L) 10/21/2023 03:07 AM    PO4 5.5 (H) 10/21/2023 03:07 AM    ALBUMIN 2.3 (L) 10/21/2023 03:07 AM    TOTPROT 5.1 (L) 10/21/2023 03:07 AM    ALKPHOS 111 (H) 10/21/2023 03:07 AM    AST 456 (H) 10/21/2023 03:07 AM    ALT 472 (H) 10/21/2023 03:07 AM    TOTBILI 0.7 10/21/2023 03:07 AM    GFR 37 (L) 10/21/2023 03:07 AM    GFRAA >60 05/28/2019 06:06 PM        Lab Results   Component Value Date    MG 3.3 (H) 10/21/2023     Lab Results   Component Value Date    TRIG 255 (H) 10/21/2023     Lab Results   Component Value Date  GLUPOC 153 (H) 10/21/2023    GLUPOC 148 (H) 10/21/2023    GLUPOC 146 (H) 10/20/2023    GLUPOC 145 (H) 10/20/2023    GLUPOC 99 10/20/2023       Admit Weight (Kg): Weight: 117.9 kg (260 lb)   Current Weight (Kg): Weight: 109.7 kg (241 lb 13.5 oz)  TPN Dose Weight (Kg): 83.3 kg (desired wt at BMI=24.9)    Estimated Kcal Needs:2100-2330 (25-28 kcal/kg DBW 83.3 kg)  Estimated Protein Needs:125-140g  (1.5-1.7 g/kg DBW 83.3 kg)    Malnutrition Details:  Malnutrition present on admission  ICD-10 code E44: Acute illness/Moderate non-severe malnutrition  Energy Intake: Less than 75% of estimated energy requirement for greater than 7days, Weight loss: 1-2% x 1 week            Loss of Subcutaneous Fat: No      Muscle Wasting: No                   Malnutrition Interventions: TPN.     A/P:   Blake Decker is a 73 y.o. male with a PMH that includes HTN, Afib, CAD s/p PCI w/ stent (2014), dilated cardiomyopathy, PVD, COPD, OSA, tobaccoism who presented with increasing abdominal pain. 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  Now, s/p re-opening of recent laparotomy, enterectomy resection and anastomosis on 1/16.  Started on PN 1/16 due to current nutrition status and anticipated continued NPO status.  (Refer to NSS consult 1/16 for nutrition/wt hx details).      CT obtained 1/20 with decreased opacification of the venous mesenteric vessels, concerning for hypoperfusion. A CTA abdomen was obtained 1/21 showing occlusion of the SMA and celiac axis stents as well as possible pneumatosis of the colon. E-stat to OR 1/21 for reopening laparotomy, found to have failure of small bowel anastomosis, s/p resection, aortomesenteric bypass to hepatic and SMA with 12x6 bifurcated graft; left in discontinuity with abthera placemeent.  Pt to OR 1/23 for reopening of recent lap with enterectomy and noted to have ~200 cm bowel remaining, remains in discontinuity with Abtherra replaced. **Of note, Pt to return to OR again tomorrow (1/25) for re-exploration, with anticipation of additional bowel resection, hopeful re-anastomosis per discussion in ICU rounds this am.      1/24:  Pt intubated 1/21-1/24, successfully extubated this am.  With discontinuation of propofol, will add back goal SMOF lipids to next PN bag.  To accommodate  increase in macro-nutrients, need to increase PN volume slightly--however keeping sterile water minimal in PN due to sterile water restrictions throughout the hospital.  Remains in bowel discontinuity, NPO with NGT to LIWS,with 1950 ml over past 24 hrs. Requiring Day 9 of central PN.  Making attempts to wean off vaso and  phenylephrine.  Will adjust Na+ in next PN to maintain 1/2NS concentration with slight increase in PN volume.  Continuing to withhold Mg and Phos in PN bag based on elevated labs--Anticipate having to add back some of each to PN as Cr/renal labs improve. Continuing with current K+ for now as stable and keeping with max acetate additives.  No other PN changes planned.      PN Changes (to start at 20:30):  Increased PN rate from 60 to 72 ml/hr--total volume 1440 ml to 1728 ml  Increased Na+ from 110 to 132 mEq  Added 315 ml SMOFlipids.       PN Order (to start at 20:30):  Nonstandard TPN: 72 ml/hr x 24hr (1728 ml total volume)    Macronutrients: AA  130g (goal), Dex 280 g (goal), SMOFlipids 315 ml (goal)   Lytes: Na 132 mEq, K 70 mEq, Ca 10 mEq, Mg 0 mEq, Phos 0 mmol, Max acetate  Additives: 10 ml multivitamin, 1ml trace elements, and 100mg  thiamine     PN to provide 2102 kcals* (25 kcals/kg; ~100% estimated needs), 130 g AA (1.56 g/kg, 100% estimated needs)    On weekends/holidays, no action needs to be taken, if no changes are desired.  For PN changes/questions, NSS is available on Vo[employer] or by pager [phone] for assistance.     [place] Pink, RD, LD, CNSC  Available on [employer] Me

## 2023-10-21 NOTE — Progress Notes
 RT Adult Assessment Note    NAME:Blake Decker             MRN: [id number]             DOB:08-05-1951          AGE: 73 y.o.  ADMISSION DATE: 10/10/2023             DAYS ADMITTED: LOS: 11 days    Additional Comments:  Impressions of the patient: pt resting in bed, no S&S of respiratory distress  Intervention(s)/outcome(s): evaluated for RT needs, s/p extubation  Patient education that was completed: NA  Recommendations to the care team: NA    Vital Signs:  Pulse: 74  RR: 15 PER MINUTE  SpO2: 94 %  O2 Device: High flow nasal cannula  Liter Flow: 4 Lpm  O2%: 40 %    Breath Sounds:   Right Apex Breath Sounds: Coarse crackles  Right Base Breath Sounds: Decreased  Left Apex Breath Sounds: Coarse crackles  Left Base Breath Sounds: Fine crackles  All Breath Sounds: Decreased  Respiratory Effort:   Respiratory Effort/Pattern: Unlabored  Comments:

## 2023-10-21 NOTE — Progress Notes
 Adult Mechanical Ventilator Liberation    Name: Blake Decker   MRN: [id number]     DOB: 10/08/1950      Age: 73 y.o.  Admission Date: 10/10/2023     LOS: 11 days     Date of Service: 10/21/2023        Adult Mechanical Ventilator Liberation: Twice daily     Weaning Readiness Screen Met (RT Only):: Yes  Initial Weaning Minute Volume (L/min): 14.8 L/min  Initial Weaning Respiratory Rate: 27 breaths/min  Initial Weaning Tidal Vol (mL)  (Calc.): 548 mL  Initial RSBI (Calculated): 49  Spontaneous Breathing Trial Completed (RT Only): Yes (Twice Daily)  Post Weaning Minute Volume (L/min): 13.5 L/min  Post Weaning Respiratory Rate: 27 breaths/min  Post Weaning Tidal Vol (mL)  (Calc.): 500 mL  NIF Ventilated: -28 cm H2O  $$ Vital Capacity (mL): 510 ml  Post RSBI (Calculated): 54  RSBI Ratio: 10.2  Spontaneous Breathing Trial Successful (Twice Daily): Yes  Patient Having Procedure Requiring Intubation in the Next 12 Hours?  (Twice Daily): No  Does Provider Want to Proceed with Extubation? (Twice Daily): Yes  $$ Extubation (RT only): Extubated to non-heated high flow cannula oxygen device

## 2023-10-21 NOTE — Case Management (ED)
 Case Management Progress Note    NAME:Blake Decker                          MRN: [id number]              DOB:April 25, 1951          AGE: 73 y.o.  ADMISSION DATE: 10/10/2023             DAYS ADMITTED: LOS: 11 days      Today's Date: 10/21/2023    PLAN: Discharge to inpatient setting anticipated. OR tomorrow.     Expected Discharge Date: 10/27/2023   Is Patient Medically Stable: No, Please explain: OR tomorrow  Are there Barriers to Discharge? no    INTERVENTION/DISPOSITION:  Discharge Planning    SW attended team huddle and reviewed EMR. Pt going to OR tomorrow. Remains intubated at this time.    PT/OT recommending inpatient setting. Will follow up next week as appropriate.                  Transportation              Does the Patient Need Case Management to Arrange Discharge Transport? (ex: facility, ambulance, wheelchair/stretcher, Medicaid, cab, other): No  Will the Patient Use Family Transport?: Yes  Transportation Name, Phone and Availability #1:  ([name] - [phone])  Support                 Info or Referral                 Positive SDOH Domains and Potential Barriers                   Medication Needs                                                                                                                                                         Financial                 Legal                 Other                 Discharge Disposition  Selected Continued Care - Admitted Since 10/10/2023    No services have been selected for the patient.           [name]    [employer]

## 2023-10-21 NOTE — Progress Notes
 Brief ortho note:     L ankle fracture s/p ORIF 1/1 @ [facility]    Introduced self role.     Pt cooperative for exam. Denies known previous ankle injury prior to his existing ankle fracture, I am unsure how reliable his knowledge of this is given slightly agitated state. He is less willing his personal life when I inquired as I re splinted his leg he states none of your business.  SICU team at the bedside assisting with sputum suction prn and splint change to LLE.     Alert  Resp No acute distress  LLE SILT, cap refill <3 sec, incision is CDI, no erythema warmth or induration. Wiggles toes, staples are intact to left ankle incision, edges approximated. Assists with flexion, extension at the knee, able to easily range the knee. AO splint applied to the LLE.     -SICU updates yesterday the patient's family would like to continue orthopedic care of the LLE here at [facility], Dr. Cherene Julian updated and is amendable with this plan.   -Keep the splint CDI to the LLE, report drainage or concerns to orthopedics.   -NWB to LLE, elevate the limb and offload the heel off the end of the pillow vs prevalon to LLE to keep pressure off the heel to prevent skin breakdown.   -XR left ankle ordered s/p splint change.  Orthopedics to follow.   -Lovenox for DVT ppx while inpatient recommended unless medically contraindicated.     Brunilda Payor, APRN-NP  Orthopedic Surgery   [employer]

## 2023-10-21 NOTE — Progress Notes
 Daily Progress Note      Date of Service:  10/21/2023  Name:  Blake Decker                       MRN:  [id number]   Admission Date: 10/10/2023 (LOS: 11 days)                     Assessment: Blake Decker is a 73 y.o. 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 s/p lap SBR, abthera placement (1/13) s/p celiac and SMA stent placement via L radial access (1/14) s/p anastomosis, abdominal closure (1/16) c/b stent thrombosis s/p aorto-SMA hepatic bypass, SBR, splenorrhaphy, Abthera (1/21) s/p SBR, Abthera placement (1/23)    Principal Problem:    Superior mesenteric artery stenosis (HCC)  Active Problems:    Moderate malnutrition (HCC)    Abdominal pain, generalized    Diarrhea    Mesenteric ischemia (HCC)    Atrial fibrillation (HCC)    Acute blood loss anemia    Severe sepsis with septic shock (HCC)    S/P small bowel resection    Leukocytosis, unspecified      Plan:  - Tachycardia has improved. Continues to require phenyl and vaso, phenyl titrating down.    - Amio gtt for Afib with RVR  - Management of sedation, pressors, and vent wean per SICU team  - Continue ASA.as able  - Currently in discontinuity with Abthera in place, SICU to return to the OR tomorrow. Abthera output SS at this time.  - Leukocytosis in the 20s, but down today.   - Cr and liver enzymes down today.   - Please page [phone] with questions, concerns, or changes in clinical status.     Discussed with Dr. Naomie Dean  ________________________________________________________    Subjective:  No acute events overnight. Afebrile.     Objective:  BP: (131)/(56)   ABP: (95-191)/(44-62)   Temp:  [37.1 ?C (98.8 ?F)-37.8 ?C (100 ?F)]   Pulse:  [69-105]   Respirations:  [16 PER MINUTE-28 PER MINUTE]   SpO2:  [90 %-98 %]   O2%:  [40 %-100 %]   O2 Device: Ventilator  Body mass index is 32.79 kg/m?Marland Kitchen    Lab Results   Component Value Date/Time    HGB 12.3 (L) 10/21/2023 03:07 AM HCT 38.7 (L) 10/21/2023 03:07 AM    WBC 21.50 (H) 10/21/2023 03:07 AM    PLTCT 155 10/21/2023 03:07 AM    INR 1.5 (H) 10/18/2023 07:01 PM     Lab Results   Component Value Date/Time    NA 140 10/21/2023 03:07 AM    K 4.0 10/21/2023 03:07 AM    CL 108 10/21/2023 03:07 AM    CO2 19 (L) 10/21/2023 03:07 AM    BUN 91 (H) 10/21/2023 03:07 AM    CR 1.92 (H) 10/21/2023 03:07 AM    MG 3.3 (H) 10/21/2023 03:07 AM    PO4 5.5 (H) 10/21/2023 03:07 AM    CA 8.1 (L) 10/21/2023 03:07 AM     Lab Results   Component Value Date/Time    GLUPOC 148 (H) 10/21/2023 06:01 AM    GLUPOC 146 (H) 10/20/2023 10:37 PM    GLUPOC 145 (H) 10/20/2023 06:27 PM          Physical Exam  General: Intubated and sedated.   Head: Normocephalic, without obvious abnormality, atraumatic. NGT in place with bilious output  Lungs: Mechanically ventilated. Synchronous with vent. Symmetric chest rise  Heart: Regular rate  Abdomen: Soft, distended. Does not appear to be ttp palpation.  Abthera in place with SS output  Skin: Skin color, texture, turgor normal. No rashes or lesions.   Extremity: No clubbing, cyanosis, or edema       ICD-10 code E44: Acute illness/Moderate non-severe malnutrition  Energy Intake: Less than 75% of estimated energy requirement for greater than 7days, Weight loss: 1-2% x 1 week        Loss of Subcutaneous Fat: No      Muscle Wasting: No             Malnutrition Interventions: Nutrition assessment, avoid prolonged NPO status, monitor nutrition plans.    Active Wounds          Wounds Surgical incision Medial Abdomen (Active)   10/13/23 1144   Wound Type: Surgical incision   Orientation: Medial   Location: Abdomen   Wound Location Comments:    Initial Wound Site Closure: Sutures;Staples   Initial Dressing Placed: Negative pressure therapy (wound VAC dressing)   Initial Cycle:    Initial Suction Setting (mmHg):    Pressure Injury Stages:    Pressure Injury Present Within 24 Hours of Hospital Admission:    If This Pressure Injury Is Suspected to Be Device Related, Please Select the Device::    Is the Wound Open or Closed:    Wound Assessment Dressing not removed for assessment 10/21/23 0400   Wound Site Closure Staples 10/18/23 1745   Peri-wound Assessment Dry;Pale 10/21/23 0400   Wound Drainage Amount None 10/20/23 1200   Wound Drainage Description Serosanguineous 10/21/23 0400   Wound Dressing Status Intact 10/21/23 0400   Wound Care Dressing changed or new application 10/20/23 1200   Wound Dressing and/or Treatment Negative pressure therapy 10/21/23 0400   Cycle Continuous 10/21/23 0400   Suction Setting (mmHg) -125 mmHg 10/21/23 0400   Negative Pressure Therapy Care Cannister change 10/20/23 1900   Drain Output (mL) 100 ml 10/21/23 0400   Number of days: 8                                              ___________________________    Scherry Ran, MD   Service Pager: # [phone]

## 2023-10-21 NOTE — Progress Notes
 END OF SHIFT SUMMARY [facility]    Admission Date: 10/10/2023  Length of Stay: LOS: 11 days        Acute events and nursing interventions: Extubated at 1145 -> off prop, levo, and vaso after extubation. In the afternoon, patient became hypertensive in the 200's. Metoprolol and labetalol ordered. Patient is stating they are adequately pain controlled.       Communication with providers (include name/title):      Did patient sleep overnight? N/A      Patient demeanor and cognition: A&O x4      Pain  Was the patient's pain controlled this shift? Yes  If no, what new interventions were implemented?            Is the patient intubated? No      Patient Education  Patient education provided: Pain management and Skin integrity and wound care  Other patient education provided:  Learners: Patient  Method/materials used: Verbal teaching  Response to learning: Some Evidence of Learning, Needs Reinforcement    Patient Goal(s):  Patient will Verbalize understanding of individual dietary and fluid recommendations by the end of shift

## 2023-10-21 NOTE — Progress Notes
 Surgical Critical Care Progress Note    Today's Date: 10/21/2023   Hospital Day: Hospital Day: 12    History of Present Illness:   Blake Decker is a 73 y.o. 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 and anastomotic leak s/p ex lap, SBR, left in discontinuity, aorto-mesenteric hepatic and SMA bypass with bifurcated graft, cauterization of iatrogenic splenic capsular tear, Abthera placement (1/21)     Patient Active Problem List    Diagnosis Date Noted    Leukocytosis, unspecified 10/14/2023    S/P small bowel resection 10/12/2023    Moderate malnutrition (HCC) 10/11/2023    Abdominal pain, generalized 10/11/2023    Diarrhea 10/11/2023    Mesenteric ischemia (HCC) 10/11/2023    Atrial fibrillation (HCC) 10/11/2023    Acute blood loss anemia 10/11/2023    Severe sepsis with septic shock (HCC) 10/11/2023    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    Arm pain, left 10/04/2018    Acute respiratory failure with hypoxia (HCC) 09/25/2018    Severe sepsis (HCC) 09/25/2018    Olecranon bursitis of left elbow 09/25/2018    Cellulitis 09/23/2018    Atrial fibrillation with RVR (HCC) 09/23/2018    Coronary artery disease involving native coronary artery 09/23/2018    COPD (chronic obstructive pulmonary disease) (HCC) 09/23/2018    OSA (obstructive sleep apnea) 09/23/2018       Assessment & Plan:   Neuro   Acute pain due to trauma  Multimodal pain regimen:  - Tylenol IV 1000mg  q6h   - Robaxin 1000 mg iv q8h prn   - Lidocaine topical patch daily  - Fentanyl prn    Anxiety  - PTA venlafaxine and buspar held due to being in discontinuity     GCS 11T (E4,V1,M6)    CV Undifferentiated shock versus hypovolemic versus septic, resolved, HTN, Afib (Xarelto), CAD s/p PCI w/ stent (2014), dilated cardiomyopathy, angio SMA, hepatic artery, celiac stent (1/14) c/b occlusion of celiac and SMA stent s/p  aorto-mesenteric bypass to hepatic and SMA with bifurcated graft (1/21)   - MAP > 65 SBP < 180.   - PTA antihypertensives held due to pressor requirements  - PTA xarelto held.   - Rectal ASA 300mg  due to being in discontinuity   - PTA Metoprolol tartrate 50 BID held    - Metop IV 5mg  q6h was discontinued due to pressor requirement  - Continue amiodarone, providing adequate rate control  - Digoxin 125 mcg IV discontinued because started amiodarone  - Heparin gtt discontinued, not needed for bypass per vascular  - Continue Phenylephrine for specific alpha adrenergic activity   - Per vasc okay for xarelto and ASA at discharge     - CTA chest obtained 1/20 without PE, there is evidence of aspiration pneumonia, rocephin started 1/20    Pulm  Acute hypoxic respiratory failure,   Hx of COPD, OSA, tobaccoism   Mode: MMV  Set Vt (ml):  [500 milliliters]   Expired Tidal Volume Spont (mL):  [532 milliliters-936 milliliters]   Set RR:  [18 breaths/minutes]   Total Respiratory Rate (Breaths/Min):  [22 breaths/minutes-23 breaths/minutes]   Minute Volume (L/min):  [12.8 liters/minutes-15.6 liters/minutes]   %MVspon:  [100 %]  PIP Actual:  [11 cm H20-13 cm H20]   PEEP/CPAP:  [5 cm H2O]   PSupport:  [5 cm H20]   Mean Airway Pressure:  [6 cm H2O]      ABG: 7.36/38/157/21.8  Plan for repeat SBT this AM   If passed, will plan to extubate    GI/ Fluids / Electrolytes / Nutrition  Risk for constipation, Risk for electrolyte imbalance, chronic mesenteric ischemia, SMA stenosis s/p ex lap, SBR, in discontinuity ,abthera placement (1/13) s/p ex lap, primary anastomosis, abdominal wall closure (1/16) c/b leak from small bowel anastomosis s/p ex-lap SBR, cauterization of iatrogenic splenic capsular tear, left in discontinuity with abthera placement (1/21  S/p reopening recent laparotomy, SBR, Abthera (1/23)   - NPO   - Protonix 40 mg daily  - PTA lasix 40 mg currently held  - Bowel regimen: suppositories BID, had BM x2  - TPN running, continue   - Zofran prn nausea  - Abthera output SS  - NGT output 1950 bilious gastric contents  - Plan for reopening laparotomy 10/22/2023; case posted and consent obtained.     Interval imaging through hospital course  - CT abdomen/pelvis from 1/20: without significant opacification of the stents suggestive of acute/subacute in-stent thrombosis. There also appears to be diffuse distension of the small and large bowel suggestive of ileus vs potential bowel ischemia  - Obtained CTA abdomen/pelvis on 1/21 with confirmed thrombosis of both celiac and SMA stents and pneumatosis will ESTAT to OR with vascular surgery for bypass, ex lap, possible SBR, possible ostomy, consent was obtained from patient's wife tina     GU    - Cr 1.92 (2.12),  UOP 1.5L,  0.55 ml/kg/hr  - Fluid status 24 hours: - Since Admit: +[id number]  - Maintain foley     Heme/ID  Acute blood loss anemia  - Hgb stable at 12.3 (12.5). No clinical signs of overt bleeding. Continue to trend serially. Optimize fluid balance. Monitor stools for signs of occult GI bleeding.    Leukocytosis  - WBC 21 (247),  Afebrile.   - Cultures 1/13: Urine, no growth. Fecal rotavirus, cryptosporidium, cdiff negative   - Blood cultures 1/20: NGTD  - Intraoperative Wound cultures 1/21: moderate candida glabrata, heavy bacteroides thetaiotaomicron   - ABX: on flagyl and rocephin for suspected RLL PNA and until obtain source control from anastomotic leak, started micafungin due to budding yeast on wound cultures     Endo  - [facility]     MS  Impaired mobility and activities of daily living  - PT/OT  - NWB LLE (fibular fracture repaired PTA), discussed with ortho on call  - Patient due for staple removal and replacement of splint per his orthopaedic surgeon at [facility], ortho team evaluated and would like [facility] attending to view picture in chart and confirm removal, patient's family would like to establish care here    Daily Quality Checklist  Lines:  3 Peripheral IV   R IJ central line placed 1/14, required re-wiring and replacement x2 overnight on 1/22  Foley Cath  NGT  Activity progressive mobility but NWB LLE   GI Prophylaxis: protonix  DVT Prophylaxis: heparin subQ  Nutrition: NPO, TPN  Bowel Regimen: none at this time  Lab Frequency: Daily   Daily CXR: No  Disposition: SICU    Subjective    Intubated and sedated    Objective:    Physical Exam:  General: sedated  HEENT: Normocephalic, atraumatic; mucus membranes moist, NGT in place with  gastric contents, ETT in place  Lungs: on ventilator    Heart: A fib, tachycardic to 130's  Abdomen:  soft, abthera in place holding suction with SS output  Extremities:  No edema; Warm, without cyanosis, LLE in boot from fibula fracture (surgery 12/31)    Labs:    Complete Blood Counts   Recent Labs     10/19/23  0439 10/20/23  0338 10/20/23  1028 10/21/23  0307   HGB 13.1* 13.0* 12.5* 12.3*   HCT 40.8 39.2* 40.1 38.7*   WBC 27.00* 21.80* 24.60* 21.50*   PLTCT 230 172 175 155   MCV 86.2 86.9 86.6 87.0   MCH 27.8 28.8 27.1 27.6   MCHC 32.2 33.2 31.3* 31.8*   RDW 15.5* 16.1* 16.3* 16.5*   MPV 8.4 9.2 9.0 9.2     No results for input(s): NEUT, LYMPH, MONO, POIK, OVAL, PLTEST in the last 72 hours.    Invalid input(s): EOSINOPHIL         Chemistry Panel   Recent Labs     10/19/23  0439 10/19/23  1506 10/20/23  0338 10/20/23  1028 10/21/23  0307   NA 144 144 140 142 140   K 4.1 3.8 4.1 4.1 4.0   CL 110 110 109 109 108   CO2 22 22 18* 20* 19*   BUN 56* 67* 80* 83* 91*   CR 1.46* 1.84* 2.12* 2.24* 1.92*   GLU 214* 133* 129* 111* 162*   GAP 12 12 13* 13* 13*   GFR 51* 39* 33* 31* 37*   MG 2.0  --  3.4* 3.3* 3.3*   CA 8.0* 8.3* 8.1* 8.1* 8.1*   PO4 4.8*  --  6.4* 7.6* 5.5*     Recent Labs     10/19/23  0439 10/20/23  0338 10/20/23  1028 10/21/23  0307   ALKPHOS 100 137* 124* 111*   AST 273* 663* 715* 456*   ALT 194* 444* 511* 472*   TOTPROT  --  4.6* 5.1* 5.1*   TOTBILI  --  0.9 0.8 0.7   ALBUMIN  --  2.3* 2.3* 2.3*          Coagulation Studies   Recent Labs     10/18/23  1901 10/19/23  0208 10/19/23  0846   PTT 58.2* 64.0* 59.2*   INR 1.5*  --   --           Vital Signs (24 hours)  BP: (131)/(56)   ABP: (95-171)/(44-62)   Temp:  [37.2 ?C (98.9 ?F)-37.8 ?C (100 ?F)]   Pulse:  [69-105]   Respirations:  [16 PER MINUTE-28 PER MINUTE]   SpO2:  [90 %-98 %]   O2%:  [40 %-100 %]   O2 Device: Ventilator    Medications  Scheduled Meds:acetaminophen (OFIRMEV) 1,000 mg injection 100 mL, 1,000 mg, Intravenous, Q6H*  amitriptyline/gabapentin/baclofen(#) 2/6/2 % topical cream, , Topical, Q8H  aspirin rectal suppository 300 mg, 300 mg, Rectal, QDAY  cefTRIAXone (ROCEPHIN) IVP 2 g, 2 g, Intravenous, Q24H*  heparin (porcine) PF syringe 5,000 Units, 5,000 Units, Subcutaneous, Q8H  insulin aspart (U-100) (NOVOLOG FLEXPEN U-100 INSULIN) injection PEN 0-12 Units, 0-12 Units, Subcutaneous, ACHS (22)  lidocaine (LIDODERM) 5 % topical patch 2 patch, 2 patch, Topical, QDAY  metroNIDAZOLE (FLAGYL) 500 mg IVPB 100 mL, 500 mg, Intravenous, Q12H*  micafungin (MYCAMINE) 100 mg in sodium chloride 0.9% (NS) 100 mL IVPB (MB+), 100 mg, Intravenous, Q24H*  pantoprazole (PROTONIX) injection 40 mg, 40 mg,  Intravenous, QDAY  SODIUM CHLORIDE 0.9% IV SOLP (Cabinet Override), , , NOW    Continuous Infusions:   Adult Continuous Parenteral Nutrition (PN) 60 mL/hr at 10/20/23 2038    amiodarone (CORDARONE) 360 mg/200 mL D5W IV drip (std conc) (premade) 0.5 mg/min (10/20/23 2238)    phenylephrine (NEO-SYNEPHRINE) 80 mg in sodium chloride 0.9% 250 mL IV drip (quad conc) 0.2 mcg/kg/min (10/21/23 0533)    propofoL (DIPRIVAN) 10 mg/mL IV drip 30 mcg/kg/min (10/21/23 0511)    vasopressin (VASOSTRICT) 20 units in sodium chloride 0.9% (NS) 100 mL IV infusion (std conc)(premade) 1.8 Units/hr (10/21/23 0446)     PRN and Respiratory Meds:albuterol-ipratropium Q4H PRN, dextrose 50% (D50) IV PRN, fentaNYL citrate PF Q1H PRN, methocarbamoL Q8H PRN, [DISCONTINUED] ondansetron Q6H PRN **OR** ondansetron (ZOFRAN) IV Q6H PRN      I have personally reviewed pertinent labs, medications, radiology, and diagnostic procedures including: active problem list, medication list, allergies, family history, social history, health maintenance, notes from last encounter, lab results, imaging.     Lalla Brothers, MD  Electronically Signed  10/21/2023 6:13 AM  Available on Volate

## 2023-10-21 NOTE — Unmapped
 Brief Procedure Note    Date:  10/21/2023 4:51 AM     Procedure: Left radial arterial line insertion    Primary Surgeon: Hilbert Odor, MD  Assistant: Ledora Bottcher, MD    Indications:  Respiratory failure requiring intubation with need for hemodynamic monitoring and/or frequent arterial blood draws    Description: The patient's left wrist and hand were prepped with chloroprep. A 20 Gauge arterial catheter was inserted into the radial artery. One needle stick was required. Appropriate position was confirmed with the presence of pulsatile, bright red blood return and appropriate wave form on the monitor. A sterile dressing was placed over the arterial line and the procedure was concluded.    Dr. Merleen Milliner was immediately available for the procedure.    Ledora Bottcher, MD  PGY-1, Emergency Medicine  Voalte# [phone]

## 2023-10-21 NOTE — Progress Notes
 Adult Mechanical Ventilator Liberation    Name: Blake Decker   MRN: [id number]     DOB: 1950-12-17      Age: 73 y.o.  Admission Date: 10/10/2023     LOS: 11 days     Date of Service: 10/21/2023        Adult Mechanical Ventilator Liberation: Twice daily     Weaning Readiness Screen Met (RT Only):: Yes  Initial Weaning Minute Volume (L/min): 15.6 L/min  Initial Weaning Respiratory Rate: 22 breaths/min  Initial Weaning Tidal Vol (mL)  (Calc.): 709 mL  Initial RSBI (Calculated): 31  Spontaneous Breathing Trial Completed (RT Only): Yes (Twice Daily)  Post Weaning Minute Volume (L/min): 12.8 L/min  Post Weaning Respiratory Rate: 23 breaths/min  Post Weaning Tidal Vol (mL)  (Calc.): 557 mL  NIF Ventilated: -29 cm H2O  $$ Vital Capacity (mL): 546 ml  Post RSBI (Calculated): 41  RSBI Ratio: 32.26  Spontaneous Breathing Trial Successful (Twice Daily): No, RSBI Ratio > or equal to 20    The patient has been on MMV overnight and has been able to maintain 100% spontaneous. He was able to follow commands but was noted to be a bit SOB for VC maneuver. He was placed back on MMV settings and SOB resolved. These results were given to sicu team.

## 2023-10-21 NOTE — Progress Notes
 Daily Progress Note      Date of Service:  10/21/2023  Name:  Blake Decker                       MRN:  [id number]   Admission Date: 10/10/2023 (LOS: 11 days)                     Assessment: Blake Decker is a 73 y.o. 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 s/p lap SBR, abthera placement (1/13) s/p celiac and SMA stent placement via L radial access (1/14) s/p anastomosis, abdominal closure (1/16) c/b stent thrombosis s/p aorto-SMA hepatic bypass, SBR, splenorrhaphy, Abthera (1/21)    Principal Problem:    Superior mesenteric artery stenosis (HCC)  Active Problems:    Moderate malnutrition (HCC)    Abdominal pain, generalized    Diarrhea    Mesenteric ischemia (HCC)    Atrial fibrillation (HCC)    Acute blood loss anemia    Severe sepsis with septic shock (HCC)    S/P small bowel resection    Leukocytosis, unspecified      Plan:  - Continues to be tachycardic and requiring high dose pressors. On phenyl and vaso.   - Amio gtt for Afib with RVR  - Management of sedation, pressors, and vent wean per SICU team  - Continue ASA. Hep gtt is not necessary from a vascular standpoint however, patient is chronically anticoagulated   - Currently in discontinuity with Abthera in place, SICU to return to the OR today. Abthera output SS at this time.   - Cr and liver enzymes rising  - Leukocytosis in the 20s.   - Please page [phone] with questions, concerns, or changes in clinical status.     Discussed with Dr. Lourdes Sledge  ________________________________________________________    Subjective:  Febrile overnight. Continues to be tachycardic and requiring two pressors    Objective:  BP: (131)/(56)   ABP: (95-191)/(44-62)   Temp:  [37.1 ?C (98.8 ?F)-37.8 ?C (100 ?F)]   Pulse:  [69-105]   Respirations:  [16 PER MINUTE-28 PER MINUTE]   SpO2:  [90 %-98 %]   O2%:  [40 %-100 %]   O2 Device: Ventilator  Body mass index is 32.79 kg/m?Marland Kitchen    Lab Results Component Value Date/Time    HGB 12.3 (L) 10/21/2023 03:07 AM    HCT 38.7 (L) 10/21/2023 03:07 AM    WBC 21.50 (H) 10/21/2023 03:07 AM    PLTCT 155 10/21/2023 03:07 AM    INR 1.5 (H) 10/18/2023 07:01 PM     Lab Results   Component Value Date/Time    NA 140 10/21/2023 03:07 AM    K 4.0 10/21/2023 03:07 AM    CL 108 10/21/2023 03:07 AM    CO2 19 (L) 10/21/2023 03:07 AM    BUN 91 (H) 10/21/2023 03:07 AM    CR 1.92 (H) 10/21/2023 03:07 AM    MG 3.3 (H) 10/21/2023 03:07 AM    PO4 5.5 (H) 10/21/2023 03:07 AM    CA 8.1 (L) 10/21/2023 03:07 AM     Lab Results   Component Value Date/Time    GLUPOC 148 (H) 10/21/2023 06:01 AM    GLUPOC 146 (H) 10/20/2023 10:37 PM    GLUPOC 145 (H) 10/20/2023 06:27 PM          Physical Exam  General: Intubated and sedated.   Head: Normocephalic,  without obvious abnormality, atraumatic. NGT in place with bilious output  Lungs: Mechanically ventilated. Synchronous with vent. Symmetric chest rise  Heart: Regular rate  Abdomen: Soft, distended. Wincing with palpation.  Abthera in place with SS output  Skin: Skin color, texture, turgor normal. No rashes or lesions.   Extremity: No clubbing, cyanosis, or edema       ICD-10 code E44: Acute illness/Moderate non-severe malnutrition  Energy Intake: Less than 75% of estimated energy requirement for greater than 7days, Weight loss: 1-2% x 1 week        Loss of Subcutaneous Fat: No      Muscle Wasting: No             Malnutrition Interventions: Nutrition assessment, avoid prolonged NPO status, monitor nutrition plans.    Active Wounds          Wounds Surgical incision Medial Abdomen (Active)   10/13/23 1144   Wound Type: Surgical incision   Orientation: Medial   Location: Abdomen   Wound Location Comments:    Initial Wound Site Closure: Sutures;Staples   Initial Dressing Placed: Negative pressure therapy (wound VAC dressing)   Initial Cycle:    Initial Suction Setting (mmHg):    Pressure Injury Stages:    Pressure Injury Present Within 24 Hours of Hospital Admission:    If This Pressure Injury Is Suspected to Be Device Related, Please Select the Device::    Is the Wound Open or Closed:    Wound Assessment Dressing not removed for assessment 10/21/23 0400   Wound Site Closure Staples 10/18/23 1745   Peri-wound Assessment Dry;Pale 10/21/23 0400   Wound Drainage Amount None 10/20/23 1200   Wound Drainage Description Serosanguineous 10/21/23 0400   Wound Dressing Status Intact 10/21/23 0400   Wound Care Dressing changed or new application 10/20/23 1200   Wound Dressing and/or Treatment Negative pressure therapy 10/21/23 0400   Cycle Continuous 10/21/23 0400   Suction Setting (mmHg) -125 mmHg 10/21/23 0400   Negative Pressure Therapy Care Cannister change 10/20/23 1900   Drain Output (mL) 100 ml 10/21/23 0400   Number of days: 8                                              ___________________________    Scherry Ran, MD   Service Pager: # [phone]

## 2023-10-22 ENCOUNTER — Encounter: Admit: 2023-10-22 | Discharge: 2023-10-22 | Payer: MEDICARE

## 2023-10-22 ENCOUNTER — Inpatient Hospital Stay: Admit: 2023-10-22 | Discharge: 2023-10-22 | Payer: MEDICARE

## 2023-10-22 LAB — COMPREHENSIVE METABOLIC PANEL
~~LOC~~ BKR ALBUMIN: 2.2 — ABNORMAL LOW (ref 3.5–5.0)
~~LOC~~ BKR ALBUMIN: 2.2 g/dL — ABNORMAL LOW (ref 3.5–5.0)
~~LOC~~ BKR ALK PHOSPHATASE: 117 U/L — ABNORMAL HIGH (ref 25–110)
~~LOC~~ BKR ALT: 245 U/L — ABNORMAL HIGH (ref 7–56)
~~LOC~~ BKR ANION GAP: 9 — ABNORMAL LOW (ref 3–12)
~~LOC~~ BKR ANION GAP: 12 (ref 3–12)
~~LOC~~ BKR AST: 115 U/L — ABNORMAL HIGH (ref 7–40)
~~LOC~~ BKR BLD UREA NITROGEN: 67 — ABNORMAL HIGH (ref 7–25)
~~LOC~~ BKR BLD UREA NITROGEN: 70 mg/dL — ABNORMAL HIGH (ref 7–25)
~~LOC~~ BKR CALCIUM: 8.2 — ABNORMAL LOW (ref 8.5–10.6)
~~LOC~~ BKR CHLORIDE: 110 — ABNORMAL HIGH (ref 98–110)
~~LOC~~ BKR CO2: 25 mmol/L (ref 21–30)
~~LOC~~ BKR CREATININE: 0.9 — ABNORMAL LOW (ref 0.40–1.24)
~~LOC~~ BKR CREATININE: 1.1 mg/dL (ref 0.40–1.24)
~~LOC~~ BKR GLOMERULAR FILTRATION RATE (GFR): >60 — ABNORMAL LOW (ref >60–5.00)
~~LOC~~ BKR GLOMERULAR FILTRATION RATE (GFR): >60 mL/min (ref >60)
~~LOC~~ BKR POTASSIUM: 3.4 — ABNORMAL LOW (ref 3.5–5.1)
~~LOC~~ BKR SODIUM, SERUM: 145 — ABNORMAL HIGH (ref 137–147)
~~LOC~~ BKR TOTAL BILIRUBIN: 1.1 — ABNORMAL LOW (ref 0.2–1.3)
~~LOC~~ BKR TOTAL PROTEIN: 5.2 — ABNORMAL LOW (ref 6.0–8.0)

## 2023-10-22 LAB — PHOSPHORUS
~~LOC~~ BKR PHOSPHORUS: 1.6 — ABNORMAL LOW (ref 2.0–4.5)
~~LOC~~ BKR PHOSPHORUS: 4.9 mg/dL — ABNORMAL HIGH (ref 2.0–4.5)

## 2023-10-22 LAB — CBC
~~LOC~~ BKR MCHC: 32 g/dL — ABNORMAL HIGH (ref 32.0–36.0)
~~LOC~~ BKR MPV: 9.1 (ref 7.0–11.0)
~~LOC~~ BKR MPV: 9 fL (ref 7.0–11.0)
~~LOC~~ BKR PLATELET COUNT: 178 — ABNORMAL HIGH (ref 150–400)
~~LOC~~ BKR PLATELET COUNT: 200 10*3/uL — ABNORMAL LOW (ref 150–400)
~~LOC~~ BKR RBC COUNT: 4.6 10*6/uL (ref 4.40–5.50)
~~LOC~~ BKR RDW: 16 — ABNORMAL HIGH (ref 11.0–15.0)
~~LOC~~ BKR RDW: 16 % — ABNORMAL HIGH (ref 11.0–15.0)
~~LOC~~ BKR WBC COUNT: 20 10*3/uL — ABNORMAL HIGH (ref 4.50–11.00)

## 2023-10-22 LAB — LACTIC ACID(LACTATE): ~~LOC~~ BKR LACTIC ACID: 1.6 mmol/L — ABNORMAL HIGH (ref 0.5–2.0)

## 2023-10-22 LAB — MAGNESIUM
~~LOC~~ BKR MAGNESIUM: 2.6 — ABNORMAL HIGH (ref 1.6–2.6)
~~LOC~~ BKR MAGNESIUM: 2.3 mg/dL — ABNORMAL LOW (ref 1.6–2.6)

## 2023-10-22 LAB — POC GLUCOSE
~~LOC~~ BKR POC GLUCOSE: 115 mg/dL — ABNORMAL HIGH (ref 70–100)
~~LOC~~ BKR POC GLUCOSE: 101 mg/dL — ABNORMAL HIGH (ref 70–100)
~~LOC~~ BKR POC GLUCOSE: 145 mg/dL — ABNORMAL HIGH (ref 70–100)

## 2023-10-22 LAB — CULTURE-BLOOD W/SENSITIVITY

## 2023-10-22 LAB — BLOOD GASES, ARTERIAL: ~~LOC~~ BKR PCO2-ART: 33 — ABNORMAL LOW (ref 35–45)

## 2023-10-22 MED ORDER — DEXAMETHASONE SODIUM PHOSPHATE 4 MG/ML IJ SOLN
INTRAVENOUS | 0 refills | Status: DC
Start: 2023-10-22 — End: 2023-10-22

## 2023-10-22 MED ORDER — FENTANYL CITRATE (PF) 50 MCG/ML IJ SOLN
INTRAVENOUS | 0 refills | Status: DC
Start: 2023-10-22 — End: 2023-10-22

## 2023-10-22 MED ORDER — METHOCARBAMOL 100 MG/ML IJ SOLN
750 mg | INTRAVENOUS | 0 refills | Status: DC
Start: 2023-10-22 — End: 2023-10-25
  Administered 2023-10-23 – 2023-10-25 (×10): 750 mg via INTRAVENOUS

## 2023-10-22 MED ORDER — ARTIFICIAL TEARS (PF) SINGLE DOSE DROPS GROUP
OPHTHALMIC | 0 refills | Status: DC
Start: 2023-10-22 — End: 2023-10-22

## 2023-10-22 MED ORDER — ROCURONIUM 10 MG/ML IV SOLN
INTRAVENOUS | 0 refills | Status: DC
Start: 2023-10-22 — End: 2023-10-22

## 2023-10-22 MED ORDER — ONDANSETRON HCL (PF) 4 MG/2 ML IJ SOLN
INTRAVENOUS | 0 refills | Status: DC
Start: 2023-10-22 — End: 2023-10-22

## 2023-10-22 MED ORDER — FUROSEMIDE 10 MG/ML IJ SOLN
20 mg | Freq: Once | INTRAVENOUS | 0 refills | Status: CP
Start: 2023-10-22 — End: ?
  Administered 2023-10-23: 03:00:00 20 mg via INTRAVENOUS

## 2023-10-22 MED ORDER — PROPOFOL INJ 10 MG/ML IV VIAL
INTRAVENOUS | 0 refills | Status: DC
Start: 2023-10-22 — End: 2023-10-22

## 2023-10-22 MED ORDER — ADULT PN CONTINUOUS-ION BASED
INTRAVENOUS | 0 refills | Status: CP
Start: 2023-10-22 — End: ?
  Administered 2023-10-23: 02:00:00 1728.0000 mL via INTRAVENOUS

## 2023-10-22 MED ORDER — SODIUM CHLORIDE 0.9% IV SOLP
INTRAVENOUS | 0 refills | Status: DC
Start: 2023-10-22 — End: 2023-10-22

## 2023-10-22 MED ORDER — POTASSIUM PHOSPHATE 500 ML IVPB
24 MMOL | Freq: Once | INTRAVENOUS | 0 refills | Status: CP
Start: 2023-10-22 — End: ?
  Administered 2023-10-22 (×2): 24 mmol via INTRAVENOUS

## 2023-10-22 MED ORDER — LABETALOL 5 MG/ML IV SOLN
10 mg | Freq: Once | INTRAVENOUS | 0 refills | Status: CP
Start: 2023-10-22 — End: ?
  Administered 2023-10-23: 05:00:00 10 mg via INTRAVENOUS

## 2023-10-22 MED ORDER — SODIUM MIXED SALT 3% IVPB (BATCHED)
468 mL | INTRAVENOUS | 0 refills | Status: DC
Start: 2023-10-22 — End: 2023-10-22
  Administered 2023-10-22: 15:00:00 468 mL via INTRAVENOUS

## 2023-10-22 MED ORDER — OXYCODONE 5 MG/5 ML PO SOLN
5-10 mg | NASOGASTRIC | 0 refills | Status: DC | PRN
Start: 2023-10-22 — End: 2023-10-22

## 2023-10-22 MED ORDER — METHOCARBAMOL 750 MG PO TAB
750 mg | NASOGASTRIC | 0 refills | Status: DC | PRN
Start: 2023-10-22 — End: 2023-10-22

## 2023-10-22 MED ORDER — PHENYLEPHRINE HCL IN 0.9% NACL 1 MG/10 ML (100 MCG/ML) IV SYRG
INTRAVENOUS | 0 refills | Status: DC
Start: 2023-10-22 — End: 2023-10-22

## 2023-10-22 MED ORDER — LIDOCAINE (PF) 200 MG/10 ML (2 %) IJ SYRG
INTRAVENOUS | 0 refills | Status: DC
Start: 2023-10-22 — End: 2023-10-22

## 2023-10-22 MED ORDER — HYDROXYZINE HCL 50 MG/ML IM SOLN
50 mg | Freq: Every evening | INTRAMUSCULAR | 0 refills | Status: DC | PRN
Start: 2023-10-22 — End: 2023-10-24
  Administered 2023-10-23 – 2023-10-24 (×2): 50 mg via INTRAMUSCULAR

## 2023-10-22 MED ORDER — SUCCINYLCHOLINE CHLORIDE 20 MG/ML IJ SOLN
INTRAVENOUS | 0 refills | Status: DC
Start: 2023-10-22 — End: 2023-10-22

## 2023-10-22 MED ORDER — SUGAMMADEX 100 MG/ML IV SOLN
INTRAVENOUS | 0 refills | Status: DC
Start: 2023-10-22 — End: 2023-10-22

## 2023-10-22 MED ORDER — DEXMEDETOMIDINE IN 0.9 % NACL 20 MCG/5 ML (4 MCG/ML) IV SYRG
INTRAVENOUS | 0 refills | Status: DC
Start: 2023-10-22 — End: 2023-10-22

## 2023-10-22 NOTE — Progress Notes
 CLINICAL NUTRITION                                                       Nutrition Support Service (NSS) Brief Progress Note    PN therapy start date: 10/13/2023  PN Indication: NPO for 7 days or more (to date or expected)   Estimated PN therapy end date: unknown, when EN vs PO feasible and meeting at least 65% estimated needs consistently      Diet Order: NPO  TF: n/a  Pertinent allergies/intolerances: No known    Estimated Kcal Needs:2100-2330 (25-28 kcal/kg DBW 83.3 kg)  Estimated Protein Needs:125-140g  (1.5-1.7 g/kg DBW 83.3 kg)    Comment: IV room reached out to on call NSS RD for low K+ and phos. No mag/ phos in PN as pt previously with high trending lytes likely 2/2 renal function. Renal labs improving and phos low requiring replacements 1/25 so will conservatively add back in some mag/ phos. Increasing K+ as well. Adjusting ZO:XWRUEAV to provide a bit of Cl as CO2 quick to jump up x24 hrs. OR plans for today.     PN Changes (to start at 20:30):  Increasing K+ from 70 mEq to 80 mEq   Increasing phos from 0 mmol to 8 mmol  Increasing mag from 0 mEq to 2 mEq  Adjusting WU:[id number] from max acetate to 1:3    PN Order (to start at 20:30):  Nonstandard TPN: 72 ml/hr x 24hr (1728 ml total volume)    Macronutrients: AA  130g (goal), Dex 280 g (goal), SMOFlipids 315 ml (goal)   Lytes: Na 132 mEq, K 80 mEq, Ca 10 mEq, Mg 2 mEq, Phos 8 mmol, NW:GNFAOZH 1:3  Additives: 10 ml multivitamin, 1ml trace elements, and 100mg  thiamine     PN to provide 2102 kcals* (25 kcals/kg; ~100% estimated needs), 130 g AA (1.56 g/kg, 100% estimated needs)     On weekends/holidays, no action needs to be taken, if no changes are desired.  For PN changes/questions, NSS is available on Voalte or by pager [phone] for assistance.     Clinical Dietitian: Maris Berger, RDN, LDN, CNSC  Available on Voalte  Office: [phone]

## 2023-10-22 NOTE — Anesthesia Pre-Procedure Evaluation
Anesthesia Pre-Procedure Evaluation    Name: Blake Decker      MRN: [id number]     DOB: [birth year]     Age: 73 y.o.     Sex: male   _________________________________________________________________________     Procedure Info:   Procedure Information       Date/Time: 10/22/23 1000    Procedure: REOPENING RECENT LAPAROTOMY, possible Gastrostomy tube, possible bowel resection, possible closure, possible ostomy, possible wound vac    Location: MAIN OR 01 / Main OR/Periop    Surgeons: Deatra Canter, DO            Physical Assessment  Vital Signs (last filed in past 24 hours):  BP: 171/55 (01/24 2317)  ABP: 152/49 (01/25 0700)  Temp: 36.9 ?C (98.4 ?F) (01/25 0400)  Pulse: 66 (01/25 0700)  Respirations: 15 PER MINUTE (01/25 0700)  SpO2: 93 % (01/25 0700)  O2%: 40 % (01/24 1600)  O2 Device: High flow nasal cannula (01/25 0700)  O2 Liter Flow: 1 Lpm (01/25 0700)     Glucose, POC   Date/Time Value Ref Range Status   10/22/2023 0643 101 (H) 70 - 100 mg/dL Final      Patient History   Allergies   Allergen Reactions    Dilaudid [Hydromorphone] AGITATION     WIFE REPORTS PT BECOMING HOSTILE AND COMBATIVE P DILAUDID ADMINISTRATION.  DID OKAY C MORPHINE AND FENTANYL    Hydrochlorothiazide SEE COMMENTS     Acute renal insufficiency        Current Medications    Medication Directions   amLODIPine (NORVASC) 5 mg tablet Take one tablet by mouth daily.   aspirin EC 81 mg tablet Take one tablet by mouth daily. Take with food.   atorvastatin (LIPITOR) 40 mg tablet Take one tablet by mouth daily.   busPIRone (BUSPAR) 15 mg tablet Take one tablet by mouth three times daily.   digoxin (LANOXIN) 125 mcg (0.125 mg) tablet Take one tablet by mouth daily.   furosemide (LASIX) 40 mg tablet Take one tablet by mouth every morning.   lisinopril (PRINIVIL; ZESTRIL) 20 mg tablet Take one tablet by mouth twice daily.   metoprolol tartrate (LOPRESSOR) 50 mg tablet Take one tablet by mouth twice daily.   MULTIVITAMIN PO Take 1 tablet by mouth daily.   nitroglycerin (NITROSTAT) 0.4 mg tablet Place one tablet under tongue every 5 minutes as needed for Chest Pain. Max of 3 tablets, call 911.   oxyCODONE 10 mg tablet Take one tablet by mouth three times daily as needed for Pain.   pantoprazole DR (PROTONIX) 40 mg tablet Take one tablet by mouth daily before breakfast.   potassium chloride (K-DUR) 10 mEq tablet Take one tablet by mouth daily.   pregabalin (LYRICA) 200 mg capsule Take one capsule by mouth three times daily.   rivaroxaban (XARELTO) 20 mg tablet Take one tablet by mouth daily. Take with food.   rOPINIRole (REQUIP) 1 mg tablet Take one tablet by mouth at bedtime daily.   tadalafiL (CIALIS) 20 mg tablet Take one tablet by mouth as Needed.   tamsulosin (FLOMAX) 0.4 mg capsule Take one capsule by mouth daily. Do not crush, chew or open capsules. Take 30 minutes following the same meal each day.   venlafaxine XR (EFFEXOR XR) 150 mg capsule Take one capsule by mouth daily. Take with food.       Review of Systems/Medical History      Patient summary reviewed  Pertinent labs reviewed  PONV Screening: Non-smoker    No history of anesthetic complications    No family history of anesthetic complications        Pulmonary       Current smoker        COPD       Home oxygen use      Acute hypoxic resp failure      Cardiovascular       Recent diagnostic studies:          echocardiogram          ?  The left ventricular size is normal. Wall thickness is increased. Concentric hypertrophy. The left ventricular systolic function is normal. The visually estimated ejection fraction is 55%. There are segmental wall motion abnormalities, as described below.  ?  The right ventricle is mildly dilated. The right ventricular systolic function is normal.  ?  Right Atrium: Mildly dilated.  ?  No significant valvular disease.  ?  No pericardial effusion.  ?  Compared with study dated 09/25/2018, global LV function appears slightly improved.            Exercise tolerance: <4 METS       Beta Blocker therapy: Yes      Hypertension          Coronary artery disease (PCI to PDA in 2014)            Dysrhythmias (AF with RVR on admission - rate contro. Patient was diagnosed with afib 6 mo ago.)      PVD (?subclavian stenosis)      CHF (HFrEF, EF 47% by TTE, 29% MPI, current exacerbation)        Dyspnea on exertion      GI/Hepatic/Renal             No GERD        No liver disease:         Renal disease (Unknown baseline):           Recent mesenteric revascularization with concerns for reperfusion injury      Neuro/Psych       No seizures        No CVA      Chronic opioid use      Musculoskeletal         L elbow MRSA septic bursitis, LUE cellulitis        Endocrine/Other           Anemia        Obesity        Sepsis    Constitution       Daptomycin for septic bursitis         Physical Exam    Airway Findings      Mallampati: II      TM distance: <3 FB      Neck ROM: full      Mouth opening: good    Dental Findings:         Comments: No teeth    Cardiovascular Findings:       Rhythm: regular      Rate: normal    Pulmonary Findings:       Breath sounds clear to auscultation. Decreased breath sounds.    Abdominal Findings:       Obese    Neurological Findings:         Sedated    Constitutional findings:       No acute distress  Previous Airway Procedure Notes Displaying the 3 most recent records      Date Mask Difficulty Techninque used for succesful ETT placement Blade Type Blade Size View with successful placement Number of attempts    10/18/23 0 - not attempted video laryngoscopy Glide Scope 4 grade I - full view of glottis 1    10/10/23 0 - not attempted video laryngoscopy Glide Scope 3 grade IIa - partial view of glottis 1            Patient Lines/Drains/Airways Status       Active Lines:       Name Placement date Placement time Site Days    CVC Triple Non-tunneled Internal jugular, right 10/11/23  1622  -- 11    Arterial Line Radial, left 10/21/23  0440  -- 1    Temporary GI Tube Nasogastric Nare, right 10/13/23  1600  -- 9    Indwelling Urinary Catheter Standard 2-way 10/18/23  1200  -- 4    Peripheral IV 10/19/23 0938 Right Mid Forearm 20 G 10/19/23  0938  -- 3                  Diagnostic Tests  Hematology:   Lab Results   Component Value Date    HGB 12.3 (L) 10/22/2023    HCT 38.3 (L) 10/22/2023    PLTCT 178 10/22/2023    WBC 21.40 (H) 10/22/2023    NEUT 85 (H) 10/10/2023    ANC 20.3 (H) 10/17/2023    LYMPH 1 (L) 10/17/2023    ALC 1.00 10/10/2023    MONA 9 10/10/2023    AMC 1.60 (H) 10/10/2023    EOSA 0 10/10/2023    ABC 0.10 10/10/2023    BASOPHILS 1 09/29/2018    MCV 84.9 10/22/2023    MCH 27.3 10/22/2023    MCHC 32.2 10/22/2023    MPV 9.1 10/22/2023    RDW 16.1 (H) 10/22/2023         General Chemistry:   Lab Results   Component Value Date    NA 145 10/22/2023    K 3.4 (L) 10/22/2023    CL 110 10/22/2023    CO2 26 10/22/2023    GAP 9 10/22/2023    BUN 67 (H) 10/22/2023    CR 0.98 10/22/2023    GLU 131 (H) 10/22/2023    CA 8.2 (L) 10/22/2023    ALBUMIN 2.2 (L) 10/22/2023    LACTIC 0.7 10/20/2023    OBSCA 1.06 10/20/2023    MG 2.6 10/22/2023    TOTBILI 1.1 10/22/2023    PO4 1.6 (L) 10/22/2023      Coagulation:   Lab Results   Component Value Date    PT 16.1 (H) 10/18/2023    PTT 59.2 (H) 10/19/2023    INR 1.5 (H) 10/18/2023       PAC Plan    Anesthesia Plan    ASA score: 4   Plan: general and invasive monitoring  Special equipment/procedures: Art line  NPO status: acceptable      Informed Consent  Anesthetic plan and risks discussed with patient.        Plan discussed with: surgeon/proceduralist and CRNA.      Alerts

## 2023-10-22 NOTE — Anesthesia Procedure Notes
Procedure: Airway Placement    AIRWAY INSERTION    Date/Time: 10/22/2023 10:01 AM    Patient location: OR  Urgency: elective  Difficult Airway: No            Airway Procedure  Indication(s) for airway management: surgery        Rapid Sequence Induction (RSI) used: yes    Preoxygenated: yes  Patient position: sniffing    Mask difficulty assessment: 0 - not attempted      Procedure Outcome  Final airway type: endotracheal airway  Endotracheal airway: ETT          ETT size (mm): 7.5  Technique used for successful ETT placement: video laryngoscopy  Devices/methods used in placement: intubating stylet  Insertion site: oral  Blade type: Glide Scope   Laryngoscope/Videolaryngoscope blade size: 4        Measured from: lips   Depth: 23 cm  Amount of Air in Cuff: 10 ml  Number of attempts at approach: 1  Placement verified by auscultation and capnometry          Additional notes: Atraumatic placement, no changes in oral mucosa noted.      Performed by: Neal Dy, MD  Authorized by: Neal Dy, MD

## 2023-10-22 NOTE — Progress Notes
 Surgical Critical Care Progress Note    Today's Date: 10/22/2023   Hospital Day: Hospital Day: 13    History of Present Illness:   Blake Decker is a 73 y.o. 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 and anastomotic leak s/p ex lap, SBR, left in discontinuity, aorto-mesenteric hepatic and SMA bypass with bifurcated graft, cauterization of iatrogenic splenic capsular tear, Abthera placement (1/21) s/p ex lap SBR washout, abthera placement (1/23).    Plan for 1/25:  - OR today for reopening of recent laparotomy, washout, possible bowel resection, possible anastomosis, possible wound vac, possible closure. Patient consented.  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    Arm pain, left 10/04/2018    Acute respiratory failure with hypoxia (HCC) 09/25/2018    Severe sepsis (HCC) 09/25/2018    Olecranon bursitis of left elbow 09/25/2018    Cellulitis 09/23/2018    Atrial fibrillation with RVR (HCC) 09/23/2018    Coronary artery disease involving native coronary artery 09/23/2018    COPD (chronic obstructive pulmonary disease) (HCC) 09/23/2018    OSA (obstructive sleep apnea) 09/23/2018     Assessment & Plan:   Neuro   Acute pain due to trauma  Multimodal pain regimen:  - Tylenol IV 1000mg  q6h   - Robaxin 1000 mg iv q8h prn   - Lidocaine topical patch daily  - Fentanyl prn    Anxiety  - PTA venlafaxine and buspar held due to being in discontinuity     GCS 11T (E4,V1,M6)    CV   Undifferentiated shock versus hypovolemic versus septic, resolved, HTN, Afib (Xarelto), CAD s/p PCI w/ stent (2014), dilated cardiomyopathy, angio SMA, hepatic artery, celiac stent (1/14) c/b occlusion of celiac and SMA stent s/p  aorto-mesenteric bypass to hepatic and SMA with bifurcated graft (1/21)   - MAP > 65 SBP < 180.   - Rectal ASA 300mg    - PTA Metoprolol 5mg  q6h   - Continue amiodarone, providing adequate rate control  - Heparin subQ q8hr  - Per vasc okay for xarelto and ASA at discharge   - PTA antihypertensives held due to pressor requirements  - PTA xarelto held    Pulm  Acute hypoxic respiratory failure,   Hx of COPD, OSA, tobaccoism   Extubated  93% 1LNC  Duoneb    GI/ Fluids / Electrolytes / Nutrition  Risk for constipation, Risk for electrolyte imbalance, chronic mesenteric ischemia, SMA stenosis s/p ex lap, SBR, in discontinuity ,abthera placement (1/13) s/p ex lap, primary anastomosis, abdominal wall closure (1/16) c/b leak from small bowel anastomosis s/p ex-lap SBR, cauterization of iatrogenic splenic capsular tear, left in discontinuity with abthera placement (1/21  S/p reopening recent laparotomy,  SBR, Abthera (1/23)   - NPO   - Protonix 40 mg daily  - PTA lasix 40 mg currently held  - Last BM: x1 1/24  - TPN  - Zofran prn nausea  - Abthera output SS  - NGT output 2850 bilious gastric contents  - Mixed salts @ 50mL/hr  - Plan for reopening laparotomy today 10/22/2023; case posted and consent obtained.     Interval imaging through hospital course  - CT abdomen/pelvis from 1/20: without significant opacification of the stents suggestive of acute/subacute in-stent thrombosis. There also appears to be diffuse distension of the small and large bowel suggestive of ileus vs potential bowel ischemia  - Obtained CTA abdomen/pelvis on 1/21 with confirmed thrombosis of both celiac and SMA stents and pneumatosis will ESTAT to OR with vascular surgery for bypass, ex lap, possible SBR, possible ostomy, consent was obtained from patient's wife tina     GU    - Cr 0.98 (1.92),  UOP 3.1L  - Fluid status 24 hours: - Since Admit: +3528  - Maintain foley     Heme/ID  Acute blood loss anemia  - Hgb stable at 12.3 (12.3). No clinical signs of overt bleeding. Continue to trend serially. Optimize fluid balance. Monitor stools for signs of occult GI bleeding.    Leukocytosis  - WBC 21.4 (21),  Afebrile.   - Cultures 1/13: Urine, no growth. Fecal rotavirus, cryptosporidium, cdiff negative   - Blood cultures 1/20: NGTD  - Intraoperative Wound cultures 1/21: moderate candida glabrata, heavy bacteroides thetaiotaomicron   - ABX: Rocephin (no end date); Flagyl (no end date); micafungin (no end date)  - on flagyl and rocephin for suspected RLL PNA and until obtain source control from anastomotic leak, started micafungin due to budding yeast on wound cultures     Endo  - [facility]   - Glucose 131    MS  Impaired mobility and activities of daily living  L ankle fracture s/p ORIF 1/1 @ [facility]  Orthopedics consulted:  - Keep the splint CDI to the LLE, report drainage or concerns to orthopedics.     -NWB to LLE, elevate the limb and offload the heel off the end of the pillow vs prevalon to LLE to keep pressure off the heel to prevent skin breakdown.   - XR left ankle ordered s/p splint change.  Orthopedics to follow.     Daily Quality Checklist  Lines:   3 Peripheral IV   R IJ central line placed 1/14, required re-wiring and replacement x2 overnight on 1/22  Foley Cath  NGT  Activity progressive mobility but NWB LLE   GI Prophylaxis: protonix  DVT Prophylaxis: heparin subQ  Nutrition: NPO, TPN  Bowel Regimen: none at this time  Lab Frequency: Daily   Daily CXR: No  Disposition: SICU    Subjective  Patient resting comfortably in bed. Pain well controlled. In good spirits.    Objective:  Physical Exam:  General: No acute distress  HEENT: Normocephalic, atraumatic; mucus membranes moist, NGT in place with gastric contents  Lungs: No increased work of breathing  Heart: Atrial fibrillation, regular rate  Abdomen: Soft, abthera in place holding suction with SS output  Extremities:  No edema; Warm, without cyanosis, LLE in boot from fibula fracture     Labs:    Complete Blood Counts   Recent Labs     10/20/23  0338 10/20/23  1028 10/21/23  0307 10/22/23  0308   HGB 13.0* 12.5* 12.3* 12.3*  HCT 39.2* 40.1 38.7* 38.3*   WBC 21.80* 24.60* 21.50* 21.40*   PLTCT 172 175 155 178   MCV 86.9 86.6 87.0 84.9   MCH 28.8 27.1 27.6 27.3   MCHC 33.2 31.3* 31.8* 32.2   RDW 16.1* 16.3* 16.5* 16.1*   MPV 9.2 9.0 9.2 9.1     No results for input(s): NEUT, LYMPH, MONO, POIK, OVAL, PLTEST in the last 72 hours.    Invalid input(s): EOSINOPHIL         Chemistry Panel   Recent Labs     10/20/23  0338 10/20/23  1028 10/21/23  0307 10/22/23  0308   NA 140 142 140 145   K 4.1 4.1 4.0 3.4*   CL 109 109 108 110   CO2 18* 20* 19* 26   BUN 80* 83* 91* 67*   CR 2.12* 2.24* 1.92* 0.98   GLU 129* 111* 162* 131*   GAP 13* 13* 13* 9   GFR 33* 31* 37* >60   MG 3.4* 3.3* 3.3* 2.6   CA 8.1* 8.1* 8.1* 8.2*   PO4 6.4* 7.6* 5.5* 1.6*     Recent Labs     10/20/23  0338 10/20/23  1028 10/21/23  0307 10/22/23  0308   ALKPHOS 137* 124* 111* 116*   AST 663* 715* 456* 180*   ALT 444* 511* 472* 298*   TOTPROT 4.6* 5.1* 5.1* 5.2*   TOTBILI 0.9 0.8 0.7 1.1   ALBUMIN 2.3* 2.3* 2.3* 2.2*          Coagulation Studies   No results for input(s): PTT, INR in the last 72 hours.         Vital Signs (24 hours)  BP: (143-214)/(54-71)   ABP: (137-208)/(49-70)   Temp:  [36.1 ?C (96.9 ?F)-36.9 ?C (98.4 ?F)]   Pulse:  [66-102]   Respirations:  [13 PER MINUTE-30 PER MINUTE]   SpO2:  [89 %-100 %]   O2%:  [40 %]   O2 Device: High flow nasal cannula  O2 Liter Flow: 1 Lpm    Medications  Scheduled Meds:[Transfer Hold] acetaminophen (OFIRMEV) 1,000 mg injection 100 mL, 1,000 mg, Intravenous, Q6H*  amitriptyline/gabapentin/baclofen(#) 2/6/2 % topical cream, , Topical, Q8H  [Transfer Hold] aspirin rectal suppository 300 mg, 300 mg, Rectal, QDAY  cefTRIAXone (ROCEPHIN) IVP 2 g, 2 g, Intravenous, Q24H*  [Transfer Hold] heparin (porcine) PF syringe 5,000 Units, 5,000 Units, Subcutaneous, Q8H  [Transfer Hold] insulin aspart (U-100) (NOVOLOG FLEXPEN U-100 INSULIN) injection PEN 0-12 Units, 0-12 Units, Subcutaneous, ACHS (22)  [Transfer Hold] lidocaine (LIDODERM) 5 % topical patch 2 patch, 2 patch, Topical, QDAY  metoprolol (LOPRESSOR) injection 5 mg, 5 mg, Intravenous, Q6H*  metroNIDAZOLE (FLAGYL) 500 mg IVPB 100 mL, 500 mg, Intravenous, Q12H*  micafungin (MYCAMINE) 100 mg in sodium chloride 0.9% (NS) 100 mL IVPB (MB+), 100 mg, Intravenous, Q24H*  [Transfer Hold] pantoprazole (PROTONIX) injection 40 mg, 40 mg, Intravenous, QDAY  potassium phosphate 24 mmol in dextrose 5% (D5) 500 mL IVPB, 24 mmol, Intravenous, ONCE  [Transfer Hold] sodium bicarbonate 2% 5 mL nebulizer solution 5 mL, 5 mL, Nebulization, Q12H    Continuous Infusions:   Adult Continuous Parenteral Nutrition (PN) 72 mL/hr at 10/21/23 2016    amiodarone (CORDARONE) 360 mg/200 mL D5W IV drip (std conc) (premade) 0.5 mg/min (10/21/23 2346)    sodium mixed salt 3% (BATCHED) NaCl 1.5%, Na Acetate 1.5% IVPB 468 mL (10/22/23 0909)     PRN and Respiratory Meds:[Transfer Hold] albuterol-ipratropium Q4H PRN, dextrose 50% (  D50) IV PRN, [Transfer Hold] methocarbamoL Q8H PRN, [Transfer Hold] morphine  injection syringe Q4H PRN, [DISCONTINUED] ondansetron Q6H PRN **OR** [Transfer Hold] ondansetron (ZOFRAN) IV Q6H PRN      I have personally reviewed pertinent labs, medications, radiology, and diagnostic procedures including: active problem list, medication list, allergies, family history, social history, health maintenance, notes from last encounter, lab results, imaging.     Dawna Part, MD  Electronically Signed  10/22/2023 10:14 AM  Available on Voalte

## 2023-10-22 NOTE — Progress Notes
 END OF SHIFT SUMMARY [facility]    Admission Date: 10/10/2023  Length of Stay: LOS: 12 days        Acute events and nursing interventions: Recovered after the OR, the patient is hypertensive. Systolic goal changed to < 200. Corpak is do not manipulate, oral medications not an option at this time. Scheduled Lopressor and pain management for BP control.      Communication with providers (include name/title): Discussed care with Dr. Eudelia Bunch, MD and Dr. Armanda Magic, MD.       Did patient sleep overnight? No      Patient demeanor and cognition: A&O x4, but demonstrating some confusion. Patient is getting irritable and stating they need to sleep. Patient stated multiple times drug me up and put me out to this RN. Discussed night time options with providers for improved sleep while in the ICU.       Pain  Was the patient's pain controlled this shift? Yes  If no, what new interventions were implemented?            Is the patient intubated? No      Patient Education  Patient education provided: Skin integrity and wound care  Other patient education provided:  Learners: Patient  Method/materials used: Verbal teaching  Response to learning: Some Evidence of Learning, Needs Reinforcement    Patient Goal(s):  Patient will have improved sleep.

## 2023-10-22 NOTE — Anesthesia Post-Procedure Evaluation
Post-Anesthesia Evaluation    Name: Blake Decker      MRN: [id number]     DOB: 15-Jan-1951     Age: 73 y.o.     Sex: male   __________________________________________________________________________     Procedure Information       Anesthesia Start Date/Time: 10/22/23 0945    Procedures:       ENTERECTOMY RESECTION AND ANASTOMOSIS (Abdomen)      ENTERECTOMY RESECTION AND ANASTOMOSIS - EACH ADDITIONAL      REMOVAL FOREIGN BODY MUSCLE/ TENDON SHEATH - SIMPLE    Location: MAIN OR 01 / Main OR/Periop    Surgeons: Deatra Canter, DO            Post-Anesthesia Vitals  ABP: 189/78 (01/25 1328)   Vitals Value Taken Time   BP 181/98 10/22/23 1326   Temp     Pulse 90 10/22/23 1331   Respirations 22 PER MINUTE 10/22/23 1331   SpO2 95 % 10/22/23 1331   O2 Device     ABP 189/78 10/22/23 1328   ART BP     Vitals shown include unfiled device data.      Post Anesthesia Evaluation Note    Evaluation location: ICU  Patient participation: recovered; patient participated in evaluation  Level of consciousness: sleepy but conscious    Pain score: 0  Pain management: adequate    Hydration: normovolemia  Temperature: 36.0?C - 38.4?C  Airway patency: adequate    Perioperative Events       Post-op nausea and vomiting: no PONV    Postoperative Status  Cardiovascular status: hemodynamically stable  Respiratory status: spontaneous ventilation and supplemental oxygen  Follow-up needed: none  ICU Information    Blood Products Given-no                Staff involved in transport include: CRNA, OR nurse and anes resident      Perioperative Events  There were no known complications for this encounter.

## 2023-10-22 NOTE — Progress Notes
-   To the OR today for reopening of recent laparotomy, abdominal washout, possible bowel resection, possible anastomosis, possible wound vac, possible closure and all other indicated procedures   - Patient consented for procedure. Risks and benefits discussed with patient. Questions and concerns addressed.     Willia Craze, MD  Pager [phone]

## 2023-10-22 NOTE — Progress Notes
 Daily Progress Note      Date of Service:  10/22/2023  Name:  Blake Decker                       MRN:  [id number]   Admission Date: 10/10/2023 (LOS: 12 days)                     Assessment: Blake Decker is a 73 y.o. 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 s/p lap SBR, abthera placement (1/13) s/p celiac and SMA stent placement via L radial access (1/14) s/p anastomosis, abdominal closure (1/16) c/b stent thrombosis s/p aorto-SMA hepatic bypass, SBR, splenorrhaphy, Abthera (1/21) s/p SBR, Abthera placement (1/23)    Principal Problem:    Superior mesenteric artery stenosis (HCC)  Active Problems:    Moderate malnutrition (HCC)    Abdominal pain, generalized    Diarrhea    Mesenteric ischemia (HCC)    Atrial fibrillation (HCC)    Acute blood loss anemia    Severe sepsis with septic shock (HCC)    S/P small bowel resection    Leukocytosis, unspecified      Plan:  - Tachycardia has improved.  Off pressors this morning  - Amio gtt for Afib with RVR  - Management of sedation, pressors, and vent wean per SICU team  - Currently in discontinuity with Abthera in place, SICU to return to the OR today. Abthera output SS at this time.   - Okay to continue ASA after gen surg operation today  - Leukocytosis in the 20s, stable   - Cr and liver enzymes down today.   - Please page [phone] with questions, concerns, or changes in clinical status.     Seen and discussed with Dr. Maye Hides  ________________________________________________________    Subjective:  Significant overnight events.  Patient states he did not sleep well overnight but jokes that he will be able to take a nap today during SICU OR time.  No acute questions or concerns for the vascular team.    Objective:  BP: (143-214)/(54-71)   ABP: (116-208)/(45-70)   Temp:  [36.1 ?C (96.9 ?F)-36.9 ?C (98.4 ?F)]   Pulse:  [66-102]   Respirations:  [13 PER MINUTE-31 PER MINUTE]   SpO2: [89 %-100 %]   O2%:  [40 %]   O2 Device: High flow nasal cannula  O2 Liter Flow: 1 Lpm  Body mass index is 32.79 kg/m?Marland Kitchen    Lab Results   Component Value Date/Time    HGB 12.3 (L) 10/22/2023 03:08 AM    HCT 38.3 (L) 10/22/2023 03:08 AM    WBC 21.40 (H) 10/22/2023 03:08 AM    PLTCT 178 10/22/2023 03:08 AM    INR 1.5 (H) 10/18/2023 07:01 PM     Lab Results   Component Value Date/Time    NA 145 10/22/2023 03:08 AM    K 3.4 (L) 10/22/2023 03:08 AM    CL 110 10/22/2023 03:08 AM    CO2 26 10/22/2023 03:08 AM    BUN 67 (H) 10/22/2023 03:08 AM    CR 0.98 10/22/2023 03:08 AM    MG 2.6 10/22/2023 03:08 AM    PO4 1.6 (L) 10/22/2023 03:08 AM    CA 8.2 (L) 10/22/2023 03:08 AM     Lab Results   Component Value Date/Time    GLUPOC 101 (H) 10/22/2023 06:43 AM    GLUPOC 115 (H) 10/21/2023  10:08 PM    GLUPOC 135 (H) 10/21/2023 05:49 PM          Physical Exam  General: Intubated and sedated.   Head: Normocephalic, without obvious abnormality, atraumatic. NGT in place with bilious output  Lungs: Mechanically ventilated. Synchronous with vent. Symmetric chest rise  Heart: Regular rate  Abdomen: Soft, distended. Does not appear to be ttp palpation.  Abthera in place with SS output  Skin: Skin color, texture, turgor normal. No rashes or lesions.   Extremity: No clubbing, cyanosis, or edema       ICD-10 code E44: Acute illness/Moderate non-severe malnutrition  Energy Intake: Less than 75% of estimated energy requirement for greater than 7days, Weight loss: 1-2% x 1 week        Loss of Subcutaneous Fat: No      Muscle Wasting: No             Malnutrition Interventions: Nutrition assessment, avoid prolonged NPO status, monitor nutrition plans.    Active Wounds          Wounds Surgical incision Medial Abdomen (Active)   10/13/23 1144   Wound Type: Surgical incision   Orientation: Medial   Location: Abdomen   Wound Location Comments:    Initial Wound Site Closure: Sutures;Staples   Initial Dressing Placed: Negative pressure therapy (wound VAC dressing)   Initial Cycle:    Initial Suction Setting (mmHg):    Pressure Injury Stages:    Pressure Injury Present Within 24 Hours of Hospital Admission:    If This Pressure Injury Is Suspected to Be Device Related, Please Select the Device::    Is the Wound Open or Closed:    Wound Assessment Dressing not removed for assessment 10/22/23 0400   Wound Site Closure Staples 10/18/23 1745   Peri-wound Assessment Dry;Pale 10/22/23 0400   Wound Drainage Amount None 10/20/23 1200   Wound Drainage Description Serosanguineous 10/22/23 0400   Wound Dressing Status Intact 10/22/23 0400   Wound Care Dressing changed or new application 10/20/23 1200   Wound Dressing and/or Treatment Negative pressure therapy 10/22/23 0400   Cycle Continuous 10/22/23 0400   Suction Setting (mmHg) -125 mmHg 10/22/23 0400   Negative Pressure Therapy Care Cannister change 10/21/23 1000   Drain Output (mL) 250 ml 10/22/23 0400   Number of days: 9                                              ___________________________    Ileana Roup, MD   Service Pager: # [phone]

## 2023-10-22 NOTE — Progress Notes
 END OF SHIFT SUMMARY [facility]    Admission Date: 10/10/2023  Length of Stay: LOS: 12 days        Acute events and nursing interventions: HTN managed with a one time dose of labetalol and scheduled metoprolol.      Communication with providers (include name/title):    2300: Dr. Armanda Magic MD and Dr. Eudelia Bunch MD notified of SBP >200. 10 mg labetalol given. See MAR.       Did patient sleep overnight? Yes      Patient demeanor and cognition: Calm      Pain  Was the patient's pain controlled this shift? Yes  If no, what new interventions were implemented?            Is the patient intubated? No      Patient Education  Patient education provided: Pain management and Post procedure care  Other patient education provided: Importance of sleep for quicker healing.  Learners: Patient  Method/materials used: Verbal teaching  Response to learning: Verbalizes Understanding    Patient Goal(s):  Patient will have improved sleep by the end of shift.

## 2023-10-23 ENCOUNTER — Inpatient Hospital Stay: Admit: 2023-10-23 | Discharge: 2023-10-24 | Payer: MEDICARE

## 2023-10-23 LAB — COMPREHENSIVE METABOLIC PANEL
~~LOC~~ BKR ALBUMIN: 2.2 — ABNORMAL LOW (ref 3.5–5.0)
~~LOC~~ BKR ALK PHOSPHATASE: 111 — ABNORMAL HIGH (ref 25–110)
~~LOC~~ BKR ALT: 176 — ABNORMAL HIGH (ref 7–56)
~~LOC~~ BKR ANION GAP: 13 — ABNORMAL HIGH (ref 3–12)
~~LOC~~ BKR AST: 63 — ABNORMAL HIGH (ref 7–40)
~~LOC~~ BKR CO2: 26 — ABNORMAL HIGH (ref 21–30)

## 2023-10-23 LAB — BLOOD GASES, ARTERIAL
~~LOC~~ BKR BASE EXCESS-ART: 2.2 — ABNORMAL LOW (ref 13.5–16.5)
~~LOC~~ BKR BICARB, ART(CAL): 26 mmol/L — ABNORMAL HIGH (ref 21.0–28.0)
~~LOC~~ BKR O2 SAT-ART: 96 % — ABNORMAL LOW (ref 95.0–99.0)
~~LOC~~ BKR PCO2-ART: 34 — ABNORMAL LOW (ref 35–45)
~~LOC~~ BKR PH-ART: 7.4 — ABNORMAL HIGH (ref 7.35–7.45)
~~LOC~~ BKR PO2-ART: 88 — ABNORMAL HIGH (ref 80–100)

## 2023-10-23 LAB — CBC
~~LOC~~ BKR MCH: 28 — ABNORMAL HIGH (ref 26.0–34.0)
~~LOC~~ BKR MCHC: 33 g/dL — ABNORMAL HIGH (ref 32.0–36.0)
~~LOC~~ BKR MPV: 9.1 — ABNORMAL LOW (ref 7.0–11.0)
~~LOC~~ BKR PLATELET COUNT: 256 — ABNORMAL LOW (ref 150–400)
~~LOC~~ BKR RDW: 16 — ABNORMAL HIGH (ref 11.0–15.0)

## 2023-10-23 LAB — POC GLUCOSE
~~LOC~~ BKR POC GLUCOSE: 130 mg/dL — ABNORMAL HIGH (ref 70–100)
~~LOC~~ BKR POC GLUCOSE: 151 mg/dL — ABNORMAL HIGH (ref 70–100)
~~LOC~~ BKR POC GLUCOSE: 164 mg/dL — ABNORMAL HIGH (ref 70–100)
~~LOC~~ BKR POC GLUCOSE: 179 mg/dL — ABNORMAL HIGH (ref 70–100)

## 2023-10-23 LAB — CULTURE-ANAEROBIC

## 2023-10-23 LAB — PTT (APTT)
~~LOC~~ BKR PTT: 27 s (ref 24.0–36.5)
~~LOC~~ BKR PTT: 50 s — ABNORMAL HIGH (ref 24.0–36.5)

## 2023-10-23 MED ORDER — HEPARIN (PORCINE) BOLUS FOR CONTINUOUS INFUSION (VIAL) - APTT MAIN
20-40 [IU]/kg | INTRAVENOUS | 0 refills | Status: DC | PRN
Start: 2023-10-23 — End: 2023-10-25
  Administered 2023-10-25: 12:00:00 2358 [IU] via INTRAVENOUS

## 2023-10-23 MED ORDER — POTASSIUM CHLORIDE IN WATER 10 MEQ/50 ML IV PGBK
10 meq | INTRAVENOUS | 0 refills | Status: CP
Start: 2023-10-23 — End: ?
  Administered 2023-10-23: 13:00:00 10 meq via INTRAVENOUS

## 2023-10-23 MED ORDER — HEPARIN (PORCINE) IN 5 % DEX 20,000 UNIT/500 ML (40 UNIT/ML) IV SOLP
0-2500 [IU]/h | INTRAVENOUS | 0 refills | Status: DC
Start: 2023-10-23 — End: 2023-10-25
  Administered 2023-10-23 – 2023-10-25 (×4): 1530 [IU]/h via INTRAVENOUS

## 2023-10-23 MED ORDER — XYLITOL-YERBA SANTA MM SPRP
1 | OROMUCOSAL | 0 refills | Status: DC | PRN
Start: 2023-10-23 — End: 2023-10-26
  Administered 2023-10-23: 16:00:00 1 via OROMUCOSAL

## 2023-10-23 MED ORDER — ARTIFICIAL TEARS (PF) SINGLE DOSE DROPS GROUP
2 [drp] | OPHTHALMIC | 0 refills | Status: DC | PRN
Start: 2023-10-23 — End: 2023-10-27
  Administered 2023-10-23 – 2023-10-24 (×4): 2 [drp] via OPHTHALMIC

## 2023-10-23 MED ORDER — HEPARIN (PORCINE) 1,000 UNIT/ML IJ SOLN
7000 [IU] | Freq: Once | INTRAVENOUS | 0 refills | Status: CP
Start: 2023-10-23 — End: ?
  Administered 2023-10-23: 14:00:00 7000 [IU] via INTRAVENOUS

## 2023-10-23 MED ORDER — ADULT PN CONTINUOUS-ION BASED
INTRAVENOUS | 0 refills | Status: CP
Start: 2023-10-23 — End: ?
  Administered 2023-10-24: 02:00:00 1728.0000 mL via INTRAVENOUS

## 2023-10-23 MED ORDER — POTASSIUM CHLORIDE IN WATER 10 MEQ/50 ML IV PGBK
10 meq | INTRAVENOUS | 0 refills | Status: CP
Start: 2023-10-23 — End: ?
  Administered 2023-10-23: 12:00:00 10 meq via INTRAVENOUS

## 2023-10-23 NOTE — Progress Notes
 PHYSICAL THERAPY  RE-EVALUATION / PROGRESS NOTE      Name: Blake Decker   MRN: [id number]     DOB: [birth year]      Age: 73 y.o.  Admission Date: 10/10/2023     LOS: 13 days     Date of Service: 10/23/2023    Mobility  Progressive Mobility Level: Sit on edge of bed  Level of Assistance: Assist X2  Activity Limited By: Pain;Fatigue;Weakness    Subjective  Significant Hospital Events: 73 y.o. 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 and anastomotic leak s/p ex lap, SBR, left in discontinuity, aorto-mesenteric hepatic and SMA bypass with bifurcated graft, cauterization of iatrogenic splenic capsular tear, Abthera placement (1/21), ex lap SBR washout, abthera placement (1/23), ex lap SBR x2 with anastomosis, abdominal closure (1/25)  Special Considerations: Current oxygen requirement (2L O2)  Mental / Cognitive: Alert;Cooperative;Follows commands  Pain: Complains of pain;Does not rate pain  Pain Location: Abdomen;Post-surgical  Pain Interventions: Patient agrees to participate in therapy with current pain level;Patient assisted into position of comfort  Persons Present: Occupational Therapist;Nursing Staff    Home Living Situation  Lives With: Spouse/significant other  Type of Home: House  Entry Stairs: No stairs  In-Home Stairs: Able to live on main level  Bathroom Setup: Tub only (bathroom N/A with wheelchair)  Patient Owned Equipment: Environmental consultant with wheels;Wheelchair    Prior Level of Function  Level Of Independence: Independent with ADL and household mobility with device  History of Falls in Past 3 Months: Yes  Comments: Patient had a fall at the end of December and sustained a L fibula fracture. Per spouse, he is supposed to be NWB but was walking on it some to get into the bathroom because the wheelchair would not fit into it.  Occupation/Education: Retired  Comments: Spouse reports patient broke his ankle on 12/31 and has not been maintaining his NWB very well.    Precautions  Comments: wound vac, JP  L LE Precautions: LLE non-weight bearing  Comments: recent L fibula fx (around 09/27/23). NWB per spouse. Recommend ortho consult to assess.    ROM  R LE ROM: WFL  L LE ROM: WFL    Strength  Overall Strength: Generalized weakness    Posture/Neurological  Head Control: Independent  Posture: No postural deviations    Bed Mobility/Transfer  Bed Mobility: Supine to Sit: Moderate Assist;x2 People;Assist with B LE;Assist with Trunk  Bed Mobility: Sit to Supine: Maximum Assist;x2 People;Assist with B LE;Assist with Trunk  End Of Activity Status: In Bed;Nursing Notified;Instructed Patient to Request Assist with Mobility;Instructed Patient to Use Call Light    Balance  Sitting Balance: Static Sitting Balance;Dynamic Sitting Balance;Moderate Assist;Maximal Assist (variable)    Activity/Exercise  Sit Edge Of Bed: 8 minutes  Sit Edge Of Bed Assist: Moderate  Assist;Maximum Assist;Variable    Education  Persons Educated: Patient/Family  Patient Barriers To Learning: None Noted  Teaching Methods: Verbal Instruction  Patient Response: Verbalized Understanding  Topics: Plan/Goals of PT Interventions;Mobility Progression;Importance of Increasing Activity;Up with Assist Only;Safety Awareness    Assessment/Progress  Impaired Mobility Due To: Pain;Decreased Activity Tolerance;Post Surgical Changes  Assessment/Progress: Should Improve w/ Continued PT    AM-PAC 6 Clicks  Basic Mobility Inpatient  Turning from your back to your side while in a flat bed without using bed rails: A lot  Moving from lying on your back to sitting on the side of a flat bed without using bedrails : A Lot  Moving to and from a bed to a chair (including a wheelchair): Total  Standing up from a chair using your arms (e.g. wheelchair, or bedside chair): Total  To walk in hospital room: Total  Climbing 3-5 steps with a railing: Total  Basic Mobility Inpatient Raw Score: 8  Standardized (T-scale) Score: 22.61    Goals  Goal Formulation: With Patient  Time For Goal Achievement: 7 days  Patient Will Go Supine To/From Sit: w/ Minimal Assist  Patient Will Transfer Bed/Chair: w/ Minimal Assist  Patient Will Transfer Sit to Stand: w/ Minimal Assist  Patient Will Ambulate: 1-10 Feet, w/ Dan Humphreys, w/ Minimal Assist    Plan  Treatment Interventions: Mobility training  Plan Frequency: 5 Days per Week  PT Plan for Next Visit: EOB, trial transfers to chair - squat pivot vs stand pivot vs slideboard    PT Discharge Recommendations  Recommendation: Inpatient setting  Patient Currently Requires Physical Assist With: All mobility    Therapist: Delia Chimes, PT  Date: 10/23/2023

## 2023-10-23 NOTE — Procedures
 VAT consulted for PIV placement.  Patient currently with CVC in place.  VAT RN assessed bilateral upper extremities with sono and was unable to visualize any vessels to attempt PIV placement.  Patient's primary nurse, Samara Deist, updated on the above assessment.  Samara Deist, RN stated she would update the patient's primary team.

## 2023-10-23 NOTE — Progress Notes
 CLINICAL NUTRITION                                                       Nutrition Support Service (NSS) Brief Progress Note    Recommendations:  Continue TPN until EN vs PO tolerated and meeting at least 65% estimated needs with good tolerance.    When EN desired to initiate, would start with trickle feeds with Nutren 1.5 at 20 ml/hr and as tolerated/able advance to eventual goal of 60 ml/hr +2 pk prosource protein/day (1 pk BID) to provide at goal of 2320 kcals, 137 g protein, and 1108 ml free H2O.   Additional fluids per primary team with minimum of 30 ml H2O q4hrs; estimated maintenance hydration of 150-200 ml h2O q4hrs.     PN therapy start date: 10/13/2023  PN Indication: NPO for 7 days or more (to date or expected)   Estimated PN therapy end date: unknown, when EN vs PO feasible and meeting at least 65% estimated needs consistently      Diet Order: NPO  TF: n/a  Pertinent allergies/intolerances: No known    Estimated Kcal Needs:2100-2330 (25-28 kcal/kg DBW 83.3 kg)  Estimated Protein Needs:125-140g  (1.5-1.7 g/kg DBW 83.3 kg)    Comment: Pt s/p OR 1/25 for SBR x2 - proximal jejunum up to the ligament of treitz resected (approximately 20cm) and distal jejunum 3cm resected with anastomosis created at the ligament of treitz; over 200cm of small bowel remains. Corpak placed through the anastomosis in the jejunum. Planning to maintain NPO with TPN 1/26 with planned slow PO progression through corpak.  Mixed salts at 68mL/hr discontinued (1/26). Noted Na+ 150 and phos less than optimal at 2.2. Will decrease Na+ and increase Phos within next PN.     PN Changes (to start at 20:30):  Deceasing Na+ from 132 to 16 mEq  Increasing phosphorus from 8 to 16 mmol    PN Order (to start at 20:30):  Nonstandard TPN: 72 ml/hr x 24hr (1728 ml total volume)    Macronutrients: AA  130g (goal), Dex 280 g (goal), SMOFlipids 315 ml (goal)   Lytes: Na 16 mEq, K 80 mEq, Ca 10 mEq, Mg 2 mEq, Phos 16 mmol, ZO:XWRUEAV 1:3  Additives: 10 ml multivitamin, 1ml trace elements, and 100mg  thiamine     PN to provide 2102 kcals (25 kcals/kg; ~100% estimated needs), 130 g AA (1.56 g/kg, 100% estimated needs)    On weekends/holidays, no action needs to be taken, if no changes are desired.  For PN changes/questions, NSS is available on Vo[employer] or by pager [phone] for assistance.      Willette Cluster, MA, RD, LD, CNSC  Available on [facility]

## 2023-10-23 NOTE — Progress Notes
 Daily Progress Note      Date of Service:  10/23/2023  Name:  Blake Decker                       MRN:  [id number]   Admission Date: 10/10/2023 (LOS: 13 days)                     Assessment: Blake Decker is a 73 y.o. 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 s/p lap SBR, abthera placement (1/13) s/p celiac and SMA stent placement via L radial access (1/14) s/p anastomosis, abdominal closure (1/16) c/b stent thrombosis s/p aorto-SMA hepatic bypass, SBR, splenorrhaphy, Abthera (1/21) s/p SBR, Abthera placement (1/23) s/p SBR, anastomosis, abdominal closure (1/25)    Principal Problem:    Superior mesenteric artery stenosis (HCC)  Active Problems:    Moderate malnutrition (HCC)    Abdominal pain, generalized    Diarrhea    Mesenteric ischemia (HCC)    Atrial fibrillation (HCC)    Acute blood loss anemia    Severe sepsis with septic shock (HCC)    S/P small bowel resection    Leukocytosis, unspecified      Plan:  - Patient with mild delirium this AM, not unexpected given prolonged ICU stay. Recommend delirium precautions  - Pain appears to be well controlled.   - Rate controlled afib with amio gtt   - Management of NGT and corpak/nutrition per EGS/SICU team  - ASA as able. No AC indicated from vascular surgery perspective.   - Leukocytosis in the 20s but remains afebrile. Currently on abx. Will discuss if extended duration is warranted given synthetic graft within the abdomen.   - Please page [phone] with questions, concerns, or changes in clinical status.     Seen and discussed with Dr. Maye Hides  ________________________________________________________    Subjective:  NAEO. Patient appears confused this AM but reports adequately controlled pain. Asking his weight this morning.     Objective:  BP: (164-202)/(71-84)   ABP: (115-216)/(52-81)   Temp:  [35.9 ?C (96.7 ?F)-37 ?C (98.6 ?F)]   Pulse:  [64-90]   Respirations:  [13 PER MINUTE-25 PER MINUTE]   SpO2:  [89 %-98 %]   O2 Device: High flow nasal cannula  O2 Liter Flow: 2 Lpm  Body mass index is 30.46 kg/m?Marland Kitchen    Lab Results   Component Value Date/Time    HGB 13.3 (L) 10/23/2023 03:18 AM    HCT 39.9 (L) 10/23/2023 03:18 AM    WBC 24.10 (H) 10/23/2023 03:18 AM    PLTCT 256 10/23/2023 03:18 AM    INR 1.5 (H) 10/18/2023 07:01 PM     Lab Results   Component Value Date/Time    NA 150 (H) 10/23/2023 03:18 AM    K 3.8 10/23/2023 03:18 AM    CL 111 (H) 10/23/2023 03:18 AM    CO2 26 10/23/2023 03:18 AM    BUN 67 (H) 10/23/2023 03:18 AM    CR 1.00 10/23/2023 03:18 AM    MG 2.2 10/23/2023 03:18 AM    PO4 2.2 10/23/2023 03:18 AM    CA 8.0 (L) 10/23/2023 03:18 AM     Lab Results   Component Value Date/Time    GLUPOC 151 (H) 10/23/2023 06:37 AM    GLUPOC 130 (H) 10/22/2023 10:14 PM    GLUPOC 145 (H) 10/22/2023 05:11 PM  Physical Exam  General: Alert, cooperative. No acute distress  Head: Normocephalic, without obvious abnormality, atraumatic. NGT in place with bilious output  Eyes: PERRL, EOMI, no scleral icterus  Neck: Supple, symmetrical, trachea midline  Lungs: Unlabored respirations. Symmetric chest rise. Sating well on 2 lpm HFNC  Chest wall: No tenderness or deformity  Heart: Regular rate. Hypertensive with SBP in the 170s  Abdomen: Soft, appropriately tender, distended. Prevena in place.   Skin: Skin color, texture, turgor normal. No rashes or lesions.   Extremity: No clubbing, cyanosis, or edema  Neurologic: Grossly intact.         ICD-10 code E44: Acute illness/Moderate non-severe malnutrition  Energy Intake: Less than 75% of estimated energy requirement for greater than 7days, Weight loss: 1-2% x 1 week        Loss of Subcutaneous Fat: No      Muscle Wasting: No             Malnutrition Interventions: Nutrition assessment, avoid prolonged NPO status, monitor nutrition plans.    Active Wounds          Wounds Surgical incision Medial Abdomen (Active)   10/22/23 1023   Wound Type: Surgical incision   Orientation: Medial   Location: Abdomen   Wound Location Comments:    Initial Wound Site Closure: Sutures;Staples   Initial Dressing Placed: Gauze;Negative pressure therapy (wound VAC dressing)   Initial Cycle:    Initial Suction Setting (mmHg):    Pressure Injury Stages:    Pressure Injury Present Within 24 Hours of Hospital Admission:    If This Pressure Injury Is Suspected to Be Device Related, Please Select the Device::    Is the Wound Open or Closed:    Wound Assessment Dressing not removed for assessment 10/23/23 0400   Peri-wound Assessment Dry;Pale 10/23/23 0400   Wound Drainage Amount None 10/23/23 0400   Wound Dressing Status Intact 10/23/23 0400   Wound Dressing and/or Treatment Negative pressure therapy 10/23/23 0400   Cycle Continuous 10/23/23 0400   Suction Setting (mmHg) -125 mmHg 10/23/23 0400   Number of days: 1       Wounds Pressure injury Gluteal Cleft (Active)   10/22/23 1423   Wound Type: Pressure injury   Orientation:    Location: Gluteal Cleft   Wound Location Comments:    Initial Wound Site Closure:    Initial Dressing Placed:    Initial Cycle:    Initial Suction Setting (mmHg):    Pressure Injury Stages: Stage 1   Pressure Injury Present Within 24 Hours of Hospital Admission: No   If This Pressure Injury Is Suspected to Be Device Related, Please Select the Device::    Is the Wound Open or Closed:    Wound Assessment Non-blanchable;Red;Pink 10/23/23 0400   Peri-wound Assessment Dry;Intact 10/23/23 0400   Wound Drainage Amount None 10/23/23 0400   Wound Dressing Status None/open to air 10/23/23 0400   Number of days: 1                                              ___________________________    Scherry Ran, MD   Service Pager: # [phone]

## 2023-10-23 NOTE — Progress Notes
 Surgical Critical Care Progress Note    Today's Date: 10/23/2023   Hospital Day: Hospital Day: 14    History of Present Illness:   Blake Decker is a 73 y.o. 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 and anastomotic leak s/p ex lap, SBR, left in discontinuity, aorto-mesenteric hepatic and SMA bypass with bifurcated graft, cauterization of iatrogenic splenic capsular tear, Abthera placement (1/21), ex lap SBR washout, abthera placement (1/23), ex lap SBR x2 with anastomosis, abdominal closure (1/25)    Plan for 1/26:  - Maintain NPO with TPN today, discussed slow progression through [facility]ak   - Start Heparin gtt   - End date rocephin, flagyl, micafungin 1/29     Patient Active Problem List    Diagnosis Date Noted    Leukocytosis, unspecified 10/14/2023    S/P small bowel resection 10/12/2023    Moderate malnutrition (HCC) 10/11/2023    Abdominal pain, generalized 10/11/2023    Diarrhea 10/11/2023    Mesenteric ischemia (HCC) 10/11/2023    Atrial fibrillation (HCC) 10/11/2023    Acute blood loss anemia 10/11/2023    Severe sepsis with septic shock (HCC) 10/11/2023    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    Arm pain, left 10/04/2018    Acute respiratory failure with hypoxia (HCC) 09/25/2018    Severe sepsis (HCC) 09/25/2018    Olecranon bursitis of left elbow 09/25/2018    Cellulitis 09/23/2018    Atrial fibrillation with RVR (HCC) 09/23/2018    Coronary artery disease involving native coronary artery 09/23/2018    COPD (chronic obstructive pulmonary disease) (HCC) 09/23/2018    OSA (obstructive sleep apnea) 09/23/2018     Assessment & Plan:   Neuro   Acute pain due to trauma  Multimodal pain regimen:  - Tylenol IV 1000mg  q6h   - Robaxin 1000 mg iv q8h prn   - Lidocaine topical patch daily  - Fentanyl prn    Anxiety  - PTA venlafaxine and buspar held     GCS 15    CV   Undifferentiated shock versus hypovolemic versus septic, resolved, HTN, Afib (Xarelto), CAD s/p PCI w/ stent (2014), dilated cardiomyopathy, angio SMA, hepatic artery, celiac stent (1/14) c/b occlusion of celiac and SMA stent s/p  aorto-mesenteric bypass to hepatic and SMA with bifurcated graft (1/21)   - MAP > 65 SBP < 180.   - Rectal ASA 300mg    - PTA Metoprolol 5mg  q6h   - Continue amiodarone, providing adequate rate control  - Start Heparin gtt    - Per vasc okay for xarelto and ASA at discharge    - PTA antihypertensives held   - PTA xarelto held    Pulm  Acute hypoxic respiratory failure,   Hx of COPD, OSA, tobaccoism   Extubated  93% 2LNC  Duoneb    GI/ Fluids / Electrolytes / Nutrition  Risk for constipation, Risk for electrolyte imbalance, chronic mesenteric ischemia, SMA stenosis s/p ex lap, SBR, in discontinuity ,abthera placement (1/13) s/p ex lap, primary anastomosis, abdominal wall closure (1/16) c/b leak from small bowel anastomosis s/p ex-lap SBR, cauterization of iatrogenic splenic capsular tear, left in discontinuity with abthera placement (1/21  S/p reopening recent laparotomy, SBR,  Abthera (1/23)   - NPO  - Protonix 40 mg daily  - PTA lasix 40 mg currently held  - Last BM: x1 1/24  - TPN    - Zofran prn nausea  - Mixed salts @ 70mL/hr discontinued (1/26)     Interval imaging through hospital course  - CT abdomen/pelvis from 1/20: without significant opacification of the stents suggestive of acute/subacute in-stent thrombosis. There also appears to be diffuse distension of the small and large bowel suggestive of ileus vs potential bowel ischemia  - Obtained CTA abdomen/pelvis on 1/21 with confirmed thrombosis of both celiac and SMA stents and pneumatosis will ESTAT to OR with vascular surgery for bypass, ex lap, possible SBR, possible ostomy, consent was obtained from patient's wife tina     GU    - Cr 1.0 (1.15),  UOP 2.4L  - Fluid status 24 hours: -562.9 ml Since Admit: +3528  - Maintain foley     Heme/ID  Acute blood loss anemia  - Hgb stable at 12.3 (12.3). No clinical signs of overt bleeding. Continue to trend serially. Optimize fluid balance. Monitor stools for signs of occult GI bleeding.    Leukocytosis  - WBC 24.1 (20.9),  Afebrile.   - Cultures 1/13: Urine, no growth. Fecal rotavirus, cryptosporidium, cdiff negative   - Blood cultures 1/20: Negative  - Intraoperative Wound cultures 1/21: moderate candida glabrata, heavy bacteroides thetaiotaomicron   - ABX: Rocephin; Flagyl; micafungin (End date for all 1/29)     Endo  - MD[facility]    - Glucose 131    MS  Impaired mobility and activities of daily living  L ankle fracture s/p ORIF 1/1 @ [facility]  Orthopedics consulted:  - Keep the splint CDI to the LLE, report drainage or concerns to orthopedics.     -NWB to LLE, elevate the limb and offload the heel off the end of the pillow vs prevalon to LLE to keep pressure off the heel to prevent skin breakdown.   - XR left ankle ordered s/p splint change.  Orthopedics to follow.     Daily Quality Checklist  Lines:   3 Peripheral IV   R IJ central line placed 1/14, art line removed 1/26  Foley Cath  NGT  Activity progressive mobility but NWB LLE   GI Prophylaxis: protonix    DVT Prophylaxis: heparin gtt    Nutrition: NPO, TPN (continue 1/26)  Bowel Regimen: none at this time  Lab Frequency: Daily   Daily CXR: No  Disposition: SICU    Subjective  Patient resting comfortably in bed.    Objective:  Physical Exam:  General: No acute distress  HEENT: Normocephalic, atraumatic; mucus membranes moist, NGT in place with gastric contents  Lungs: No increased work of breathing  Heart: Atrial fibrillation, regular rate  Abdomen: Soft, abthera in place holding suction with SS output  Extremities:  No edema; Warm, without cyanosis, LLE in boot from fibula fracture     Labs:    Complete Blood Counts   Recent Labs     10/21/23  0307 10/22/23  0308 10/22/23  1327 10/23/23  0318   HGB 12.3* 12.3* 12.8* 13.3*   HCT 38.7* 38.3* 39.1* 39.9*   WBC 21.50* 21.40* 20.90* 24.10*   PLTCT 155 178 200 256   MCV 87.0 84.9 85.0 84.2   MCH 27.6 27.3 27.8 28.0   MCHC 31.8* 32.2 32.7 33.3   RDW 16.5* 16.1* 16.2* 16.3*   MPV 9.2 9.1 9.0 9.1  No results for input(s): NEUT, LYMPH, MONO, POIK, OVAL, PLTEST in the last 72 hours.    Invalid input(s): EOSINOPHIL         Chemistry Panel   Recent Labs     10/21/23  0307 10/22/23  0308 10/22/23  1327 10/23/23  0318   NA 140 145 146 150*   K 4.0 3.4* 4.8 3.8   CL 108 110 109 111*   CO2 19* 26 25 26    BUN 91* 67* 70* 67*   CR 1.92* 0.98 1.15 1.00   GLU 162* 131* 253* 167*   GAP 13* 9 12 13*   GFR 37* >60 >60 >60   MG 3.3* 2.6 2.3 2.2   CA 8.1* 8.2* 8.2* 8.0*   PO4 5.5* 1.6* 4.9* 2.2     Recent Labs     10/21/23  0307 10/22/23  0308 10/22/23  1327 10/23/23  0318   ALKPHOS 111* 116* 117* 111*   AST 456* 180* 115* 63*   ALT 472* 298* 245* 176*   TOTPROT 5.1* 5.2* 5.2* 5.3*   TOTBILI 0.7 1.1 0.9 0.7   ALBUMIN 2.3* 2.2* 2.2* 2.2*          Coagulation Studies   No results for input(s): PTT, INR in the last 72 hours.         Vital Signs (24 hours)  BP: (164-202)/(71-84)   ABP: (115-216)/(49-81)   Temp:  [35.9 ?C (96.7 ?F)-37 ?C (98.6 ?F)]   Pulse:  [64-90]   Respirations:  [13 PER MINUTE-25 PER MINUTE]   SpO2:  [89 %-98 %]   O2 Device: Nasal cannula  O2 Liter Flow: 2 Lpm    Medications  Scheduled Meds:acetaminophen (OFIRMEV) 1,000 mg injection 100 mL, 1,000 mg, Intravenous, Q6H*  amitriptyline/gabapentin/baclofen(#) 2/6/2 % topical cream, , Topical, Q8H  aspirin rectal suppository 300 mg, 300 mg, Rectal, QDAY  cefTRIAXone (ROCEPHIN) IVP 2 g, 2 g, Intravenous, Q24H*  heparin (porcine) PF syringe 5,000 Units, 5,000 Units, Subcutaneous, Q8H  insulin aspart (U-100) (NOVOLOG FLEXPEN U-100 INSULIN) injection PEN 0-12 Units, 0-12 Units, Subcutaneous, ACHS (22)  lidocaine (LIDODERM) 5 % topical patch 2 patch, 2 patch, Topical, QDAY  methocarbamoL (ROBAXIN) injection 750 mg, 750 mg, Intravenous, Q6H  metoprolol (LOPRESSOR) injection 5 mg, 5 mg, Intravenous, Q6H*  metroNIDAZOLE (FLAGYL) 500 mg IVPB 100 mL, 500 mg, Intravenous, Q12H*  micafungin (MYCAMINE) 100 mg in sodium chloride 0.9% (NS) 100 mL IVPB (MB+), 100 mg, Intravenous, Q24H*  pantoprazole (PROTONIX) injection 40 mg, 40 mg, Intravenous, QDAY  potassium chloride in water IVPB 10 mEq, 10 mEq, Intravenous, Q1H*   Followed by  potassium chloride in water IVPB 10 mEq, 10 mEq, Intravenous, Q1H*  sodium bicarbonate 2% 5 mL nebulizer solution 5 mL, 5 mL, Nebulization, Q12H    Continuous Infusions:   Adult Continuous Parenteral Nutrition (PN) 72 mL/hr at 10/22/23 1958    amiodarone (CORDARONE) 360 mg/200 mL D5W IV drip (std conc) (premade) 0.5 mg/min (10/23/23 0004)     PRN and Respiratory Meds:albuterol-ipratropium Q4H PRN, dextrose 50% (D50) IV PRN, hydrOXYzine hcl QHS PRN, morphine  injection syringe Q4H PRN, [DISCONTINUED] ondansetron Q6H PRN **OR** ondansetron (ZOFRAN) IV Q6H PRN      I have personally reviewed pertinent labs, medications, radiology, and diagnostic procedures including: active problem list, medication list, allergies, family history, social history, health maintenance, notes from last encounter, lab results, imaging.     Noe Gens, DO  Electronically Signed  10/23/2023 6:57 AM  Available on Voalte

## 2023-10-23 NOTE — Progress Notes
 OCCUPATIONAL THERAPY  RE-ASSESSMENT NOTE      Name: Blake Decker   MRN: [id number]     DOB: [birth year]      Age: 73 y.o.  Admission Date: 10/10/2023     LOS: 13 days     Date of Service: 10/23/2023      Mobility  Patient Turn/Position: Supine  Progressive Mobility Level: Sit on edge of bed  Level of Assistance: Assist X2  Activity Limited By: Pain;Fatigue;Weakness    Subjective  Significant Hospital Events: 73 y.o. 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 and anastomotic leak s/p ex lap, SBR, left in discontinuity, aorto-mesenteric hepatic and SMA bypass with bifurcated graft, cauterization of iatrogenic splenic capsular tear, Abthera placement (1/21), ex lap SBR washout, abthera placement (1/23), ex lap SBR x2 with anastomosis, abdominal closure (1/25)  Special Considerations: Current oxygen requirement (2L O2)  Mental / Cognitive: Alert;Cooperative;Follows commands  Pain: Complains of pain;Does not rate pain  Pain Location: Abdomen;Post-surgical  Pain Interventions: Patient agrees to participate in therapy with current pain level;Patient assisted into position of comfort  Persons Present: Physical Therapist;Nursing Staff    Home Living Situation  Lives With: Spouse/significant other  Type of Home: House  Entry Stairs: No stairs  In-Home Stairs: Able to live on main level  Bathroom Setup: Tub only (bathroom N/A with wheelchair)  Patient Owned Equipment: Environmental consultant with wheels;Wheelchair    Prior Level of Function  Level Of Independence: Independent with ADL and household mobility with device  History of Falls in Past 3 Months: Yes  Comments: Patient had a fall at the end of December and sustained a L fibula fracture. Per spouse, he is supposed to be NWB but was walking on it some to get into the bathroom because the wheelchair would not fit into it.  Occupation/Education: Retired  Comments: Spouse reports patient broke his ankle on 12/31 and has not been maintaining his NWB very well.    Precautions  Comments: wound vac, JP  L LE Precautions: LLE non-weight bearing  Comments: recent L fibula fx (around 09/27/23). NWB per spouse. Recommend ortho consult to assess.    ADL's  Where Assessed: Edge of Bed  Eating Deficits: NPO, but hand to mouth WNL  LE Dressing Assist: Maximum Assist  LE Dressing Deficits: Don/Doff R Sock  Toileting Assist: Total Assist  Toileting Deficits:  (foley)  Comment: Tolerated sitting EOB ~8 minutes with varrying mod-max assist while nursing staff assisted with bed bath. Patient leaning posteriorly and pushing. Unsure if due to pain or weakness.    ADL Mobility  Bed Mobility: Supine to Sit: Moderate assist;x2 people  Bed Mobility: Sit to Supine: Maximum assist;x2 people  End of Activity Status: Instructed patient to request assist with mobility;In bed;Instructed patient to use call light;Nursing notified  Sitting Balance: Moderate assist;Maximum assist    Activity Tolerance  Endurance: 3/5 Tolerates 25-30 Minutes Exercise w/Multiple Rests    Cognition  Overall Cognitive Status: WFL to Adequately Complete Self Care Tasks Safely    ROM  R UE ROM: WFL   R UE ROM Method: Active  L UE ROM: WFL   L UE ROM Method: Active    Education  Persons Educated: Patient  Teaching Methods: Verbal Instruction  Patient Response: Verbalized Understanding  Topics: Role of OT, Goals for Therapy  Goal Formulation: With Patient    Assessment  Assessment: Decreased ADL Status;Decreased Endurance;Decreased Self-Care Trans;Decreased High-Level ADLs  Prognosis: Good  Goal Formulation: Pt/family    AM-PAC 6 Clicks Daily Activity Inpatient  Putting on and taking off regular lower body clothes: A Lot  Bathing (Including washing, rinsing, drying): A Lot  Toileting, which includes using toilet, bedpan, or urinal: Total  Putting on and taking off regular upper body clothing: None  Taking care of personal grooming such as brushing teeth: None  Eating meals: None  Daily Activity Raw Score: 17  Standardized (T-scale) Score: 37.26    Plan  OT Frequency: 5x/week  OT Plan for Next Visit: Toileting, commode trasnfer as able    ADL Goals  Patient Will Perform All ADL's: w/ Stand By Assist    Functional Transfer Goals  Pt Will Transfer To Bedside Commode: w/ Stand By Assist    OT Discharge Recommendations  Recommendation: Inpatient setting  Patient Currently Requires Physical Assist With: All mobility;All personal care ADLs;All home functioning ADLs    Therapist: Jeanell Sparrow, OTD, OTR/L   Date: 10/23/2023

## 2023-10-24 ENCOUNTER — Encounter: Admit: 2023-10-24 | Discharge: 2023-10-24 | Payer: MEDICARE

## 2023-10-24 LAB — CBC
~~LOC~~ BKR HEMATOCRIT: 38 % — ABNORMAL LOW (ref 40.0–50.0)
~~LOC~~ BKR HEMOGLOBIN: 12 g/dL — ABNORMAL LOW (ref 13.5–16.5)
~~LOC~~ BKR MCH: 27 pg (ref 26.0–34.0)
~~LOC~~ BKR MCHC: 31 g/dL — ABNORMAL LOW (ref 32.0–36.0)
~~LOC~~ BKR MCV: 86 fL (ref 80.0–100.0)
~~LOC~~ BKR MPV: 9.4 fL (ref 7.0–11.0)
~~LOC~~ BKR PLATELET COUNT: 372 10*3/uL (ref 150–400)
~~LOC~~ BKR RDW: 16 % — ABNORMAL HIGH (ref 11.0–15.0)
~~LOC~~ BKR WBC COUNT: 24 10*3/uL — ABNORMAL HIGH (ref 4.50–11.00)

## 2023-10-24 LAB — POC GLUCOSE
~~LOC~~ BKR POC GLUCOSE: 147 mg/dL — ABNORMAL HIGH (ref 70–100)
~~LOC~~ BKR POC GLUCOSE: 141 mg/dL — ABNORMAL HIGH (ref 70–100)
~~LOC~~ BKR POC GLUCOSE: 162 mg/dL — ABNORMAL HIGH (ref 70–100)
~~LOC~~ BKR POC GLUCOSE: 182 mg/dL — ABNORMAL HIGH (ref 70–100)

## 2023-10-24 LAB — COMPREHENSIVE METABOLIC PANEL
~~LOC~~ BKR ALK PHOSPHATASE: 122 U/L — ABNORMAL HIGH (ref 25–110)
~~LOC~~ BKR ALT: 94 U/L — ABNORMAL HIGH (ref 7–56)
~~LOC~~ BKR AST: 30 U/L — ABNORMAL HIGH (ref 7–40)

## 2023-10-24 LAB — PTT (APTT)
~~LOC~~ BKR PTT: 53 s — ABNORMAL HIGH (ref 24.0–36.5)
~~LOC~~ BKR PTT: 51 s — ABNORMAL HIGH (ref 24.0–36.5)

## 2023-10-24 LAB — SURGICAL PATHOLOGY

## 2023-10-24 MED ORDER — POTASSIUM CHLORIDE IN WATER 10 MEQ/50 ML IV PGBK
10 meq | INTRAVENOUS | 0 refills | Status: CP
Start: 2023-10-24 — End: ?
  Administered 2023-10-24: 14:00:00 10 meq via INTRAVENOUS

## 2023-10-24 MED ORDER — POTASSIUM CHLORIDE IN WATER 10 MEQ/50 ML IV PGBK
10 meq | INTRAVENOUS | 0 refills | Status: DC
Start: 2023-10-24 — End: 2023-10-24

## 2023-10-24 MED ORDER — POTASSIUM CHLORIDE IN WATER 10 MEQ/50 ML IV PGBK
10 meq | INTRAVENOUS | 0 refills | Status: CP
Start: 2023-10-24 — End: ?
  Administered 2023-10-24: 19:00:00 10 meq via INTRAVENOUS

## 2023-10-24 MED ORDER — DIPHENHYDRAMINE HCL 50 MG/ML IJ SOLN
25 mg | Freq: Once | INTRAVENOUS | 0 refills | Status: CP
Start: 2023-10-24 — End: ?
  Administered 2023-10-25: 05:00:00 25 mg via INTRAVENOUS

## 2023-10-24 MED ORDER — POTASSIUM CHLORIDE IN WATER 10 MEQ/50 ML IV PGBK
10 meq | INTRAVENOUS | 0 refills | Status: CP
Start: 2023-10-24 — End: ?
  Administered 2023-10-24: 15:00:00 10 meq via INTRAVENOUS

## 2023-10-24 MED ORDER — POTASSIUM CHLORIDE IN WATER 10 MEQ/50 ML IV PGBK
10 meq | INTRAVENOUS | 0 refills | Status: CP
Start: 2023-10-24 — End: ?
  Administered 2023-10-24: 16:00:00 10 meq via INTRAVENOUS

## 2023-10-24 MED ORDER — ADULT PN CONTINUOUS-ION BASED
INTRAVENOUS | 0 refills | Status: CP
Start: 2023-10-24 — End: ?
  Administered 2023-10-25: 03:00:00 1920.0000 mL via INTRAVENOUS

## 2023-10-24 NOTE — Progress Notes
 Surgical Critical Care Progress Note    Today's Date: 10/24/2023   Hospital Day: Hospital Day: 15    History of Present Illness:   Blake Decker is a 73 y.o. 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 and anastomotic leak s/p ex lap, SBR, left in discontinuity, aorto-mesenteric hepatic and SMA bypass with bifurcated graft, cauterization of iatrogenic splenic capsular tear, Abthera placement (1/21), ex lap SBR washout, abthera placement (1/23), ex lap SBR x2 with anastomosis, abdominal closure (1/25)    Patient Active Problem List    Diagnosis Date Noted    Leukocytosis, unspecified 10/14/2023    S/P small bowel resection 10/12/2023    Moderate malnutrition (HCC) 10/11/2023    Abdominal pain, generalized 10/11/2023    Diarrhea 10/11/2023    Mesenteric ischemia (HCC) 10/11/2023    Atrial fibrillation (HCC) 10/11/2023    Acute blood loss anemia 10/11/2023    Severe sepsis with septic shock (HCC) 10/11/2023    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    Arm pain, left 10/04/2018    Acute respiratory failure with hypoxia (HCC) 09/25/2018    Severe sepsis (HCC) 09/25/2018    Olecranon bursitis of left elbow 09/25/2018    Cellulitis 09/23/2018    Atrial fibrillation with RVR (HCC) 09/23/2018    Coronary artery disease involving native coronary artery 09/23/2018    COPD (chronic obstructive pulmonary disease) (HCC) 09/23/2018    OSA (obstructive sleep apnea) 09/23/2018     Assessment & Plan:   Neuro   Acute pain due to trauma  Multimodal pain regimen:  - Tylenol IV 1000mg  q6h   - Robaxin 750 q6h  - Lidocaine topical patch daily  - Morphine prn    Anxiety  - PTA venlafaxine and buspar held     GCS 15    CV   Undifferentiated shock versus hypovolemic versus septic, resolved, HTN, Afib (Xarelto), CAD s/p PCI w/ stent (2014), dilated cardiomyopathy, angio SMA, hepatic artery, celiac stent (1/14) c/b occlusion of celiac and SMA stent s/p  aorto-mesenteric bypass to hepatic and SMA with bifurcated graft (1/21)   - MAP > 65 SBP < 200  - Permissive hypertension due to pale bowel mucosa    - Rectal ASA 300mg    - PTA Metoprolol 5mg  q6h held  - Continue amiodarone, providing adequate rate control, will taper to PO amio vs PTA metoprolol once able to have enteral input  - Heparin gtt    - Per vasc okay for xarelto and ASA at discharge    - PTA antihypertensives held   - PTA xarelto held    Pulm  Hx of COPD, OSA, tobaccoism   Extubated  94% RA  Duoneb    GI/ Fluids / Electrolytes / Nutrition  Risk for constipation, Risk for electrolyte imbalance, chronic mesenteric ischemia, SMA stenosis s/p ex lap, SBR, in discontinuity ,abthera placement (1/13) s/p ex lap, primary anastomosis, abdominal wall closure (1/16) c/b leak from small bowel anastomosis s/p ex-lap SBR, cauterization of iatrogenic splenic capsular tear, left in discontinuity with abthera placement (1/21  S/p reopening recent laparotomy, SBR, Abthera (1/23) ex lap SBR x2 with anastomosis, abdominal closure (1/25)  - NPO strict  - AROBF, must have persistent  bowel function prior to initiating any enteral nutrition or medication, and once has bowel function will start enteral feeds very slowly at 10cc/hr and maintain that for days since the inside of the bowel mucosa appeared pale   - PTA Protonix 40 mg daily   - PTA lasix 40 mg currently held  - Last BM: x1 1/24 (Suppositories ?)  - TPN    - Zofran prn nausea  - NGT to LIWS: 1875 ml output gastric contents (1250)  - Maintain prevena: 250 ml sanguinous output   - Maintain JP to bulb suction: 80ml SS (170)     Interval imaging through hospital course  - CT abdomen/pelvis from 1/20: without significant opacification of the stents suggestive of acute/subacute in-stent thrombosis. There also appears to be diffuse distension of the small and large bowel suggestive of ileus vs potential bowel ischemia  - Obtained CTA abdomen/pelvis on 1/21 with confirmed thrombosis of both celiac and SMA stents and pneumatosis will ESTAT to OR with vascular surgery for bypass, ex lap, possible SBR, possible ostomy, consent was obtained from patient's wife tina     GU    - Cr 1.2 (1.0),  UOP 2.2L  - Fluid status 24 hours: -863 ml Since Admit: +2279  - Remove foley, watch for void    Heme/ID  Acute blood loss anemia  - Hgb stable at 12.2 (13.3). No clinical signs of overt bleeding. Continue to trend serially. Optimize fluid balance. Monitor stools for signs of occult GI bleeding.    Leukocytosis  - WBC 24.2 (24.1),  Afebrile.   - Cultures 1/13: Urine, no growth. Fecal rotavirus, cryptosporidium, cdiff negative   - Blood cultures 1/20: Negative  - Intraoperative Wound cultures 1/21: Moderate candida glabrata (Nakaseomyces glabrata) , heavy bacteroides thetaiotaomicron   - ABX: Rocephin; Flagyl; micafungin (End date for all 1/29)     Endo  - [facility]      MS  Impaired mobility and activities of daily living  L ankle fracture s/p ORIF 1/1 @ [facility]  Orthopedics consulted:  - Keep the splint CDI to the LLE, report drainage or concerns to orthopedics.     -NWB to LLE, elevate the limb and offload the heel off the end of the pillow vs prevalon to LLE to keep pressure off the heel to prevent skin breakdown.   - XR left ankle ordered s/p splint change.  Orthopedics to follow.   - LLE re splinted and staples removed per ortho    Daily Quality Checklist  Lines:   3 Peripheral IV   R IJ central line placed 1/14, art line removed 1/26  Foley Cath  NGT  Activity progressive mobility but NWB LLE   GI Prophylaxis: protonix    DVT Prophylaxis: heparin gtt    Nutrition: TPN   Bowel Regimen: none at this time  Lab Frequency: Daily   Daily CXR: No  Disposition: SICU    Subjective  Patient resting comfortably in bed.    Objective:  Physical Exam:  General: No acute distress  HEENT: Normocephalic, atraumatic; mucus membranes moist, NGT in place with gastric contents  Lungs: No increased work of breathing  Heart: Atrial fibrillation, regular rate  Abdomen: Soft, abthera in place holding suction with Sanguinous output, JP in place with SS output  Extremities:  No edema; Warm, without cyanosis, LLE in splint from fibula fracture and staples removed    Labs:    Complete Blood Counts   Recent Labs     10/22/23  0308 10/22/23  1327 10/23/23  0318 10/24/23  0417   HGB 12.3* 12.8* 13.3* 12.2*   HCT 38.3* 39.1* 39.9* 38.8*   WBC 21.40* 20.90* 24.10* 24.20*   PLTCT 178 200 256 372   MCV 84.9 85.0 84.2 86.3   MCH 27.3 27.8 28.0 27.1   MCHC 32.2 32.7 33.3 31.4*   RDW 16.1* 16.2* 16.3* 16.2*   MPV 9.1 9.0 9.1 9.4     No results for input(s): NEUT, LYMPH, MONO, POIK, OVAL, PLTEST in the last 72 hours.    Invalid input(s): EOSINOPHIL         Chemistry Panel   Recent Labs     10/22/23  0308 10/22/23  1327 10/23/23  0318 10/24/23  0417   NA 145 146 150* 150*   K 3.4* 4.8 3.8 3.5   CL 110 109 111* 111*   CO2 26 25 26 24    BUN 67* 70* 67* 87*   CR 0.98 1.15 1.00 1.20   GLU 131* 253* 167* 153*   GAP 9 12 13* 15*   GFR >60 >60 >60 >60   MG 2.6 2.3 2.2 2.1   CA 8.2* 8.2* 8.0* 8.2*   PO4 1.6* 4.9* 2.2 3.0     Recent Labs     10/22/23  0308 10/22/23  1327 10/23/23  0318 10/24/23  0417   ALKPHOS 116* 117* 111* 122*   AST 180* 115* 63* 30   ALT 298* 245* 176* 94*   TOTPROT 5.2* 5.2* 5.3* 5.2*   TOTBILI 1.1 0.9 0.7 0.6   ALBUMIN 2.2* 2.2* 2.2* 2.4*          Coagulation Studies   Recent Labs     10/23/23  0809 10/23/23  1359 10/23/23  2015 10/24/23  0417   PTT 27.3 50.6* 53.4* 51.9*            Vital Signs (24 hours)  BP: (94-157)/(67-98)   ABP: (173)/(69)   Temp:  [36.4 ?C (97.6 ?F)-36.9 ?C (98.4 ?F)]   Pulse:  [85-108]   Respirations:  [17 PER MINUTE-27 PER MINUTE] SpO2:  [92 %-99 %]   O2 Device: None (Room air)  O2 Liter Flow: 2 Lpm    Medications  Scheduled Meds:acetaminophen (OFIRMEV) 1,000 mg injection 100 mL, 1,000 mg, Intravenous, Q6H*  amitriptyline/gabapentin/baclofen(#) 2/6/2 % topical cream, , Topical, Q8H  aspirin rectal suppository 300 mg, 300 mg, Rectal, QDAY  cefTRIAXone (ROCEPHIN) IVP 2 g, 2 g, Intravenous, Q24H*  insulin aspart (U-100) (NOVOLOG FLEXPEN U-100 INSULIN) injection PEN 0-12 Units, 0-12 Units, Subcutaneous, ACHS (22)  lidocaine (LIDODERM) 5 % topical patch 2 patch, 2 patch, Topical, QDAY  methocarbamoL (ROBAXIN) injection 750 mg, 750 mg, Intravenous, Q6H  metroNIDAZOLE (FLAGYL) 500 mg IVPB 100 mL, 500 mg, Intravenous, Q12H*  micafungin (MYCAMINE) 100 mg in sodium chloride 0.9% (NS) 100 mL IVPB (MB+), 100 mg, Intravenous, Q24H*  pantoprazole (PROTONIX) injection 40 mg, 40 mg, Intravenous, QDAY  potassium chloride in water IVPB 10 mEq, 10 mEq, Intravenous, Q30 MIN   Followed by  potassium chloride in water IVPB 10 mEq, 10 mEq, Intravenous, Q30 MIN   Followed by  potassium chloride in water IVPB 10 mEq, 10 mEq, Intravenous, Q30 MIN   Followed by  potassium chloride in water IVPB 10 mEq, 10 mEq, Intravenous, Q30 MIN   Followed by  potassium chloride in water IVPB 10 mEq, 10 mEq, Intravenous, Q30 MIN  sodium bicarbonate 2% 5 mL nebulizer solution 5 mL, 5 mL, Nebulization, Q12H  Continuous Infusions:   Adult Continuous Parenteral Nutrition (PN) 72 mL/hr at 10/23/23 2012    amiodarone (CORDARONE) 360 mg/200 mL D5W IV drip (std conc) (premade) 0.5 mg/min (10/23/23 2354)    heparin (porcine) 20,000 units/D5W 500 mL infusion (std conc)(premade) 1,530 Units/hr (10/24/23 0720)     PRN and Respiratory Meds:albuterol-ipratropium Q4H PRN, artificial tears (PF) single dose PRN, dextrose 50% (D50) IV PRN, heparin (porcine) TITRATE **AND** heparin (porcine) Q6H PRN, hydrOXYzine hcl QHS PRN, morphine  injection syringe Q4H PRN, [DISCONTINUED] ondansetron Q6H PRN **OR** ondansetron (ZOFRAN) IV Q6H PRN, saliva, artificial PRN      I have personally reviewed pertinent labs, medications, radiology, and diagnostic procedures including: active problem list, medication list, allergies, family history, social history, health maintenance, notes from last encounter, lab results, imaging.     Lanae Boast, MD  Electronically Signed  10/24/2023 7:57 AM  Available on [employer]

## 2023-10-24 NOTE — Progress Notes
 RT Adult Assessment Note    NAME:Blake Decker             MRN: [id number]             DOB:[birth year]          AGE: 73 y.o.  ADMISSION DATE: 10/10/2023             DAYS ADMITTED: LOS: 14 days    Additional Comments:  Impressions of the patient: pt resting in bed, coarse crackles and I&E wheezing and SOA prior to RT interventions. Pt coarse with unlaborbed respiratory pattern post treatment  Intervention(s)/outcome(s): evaluated for RT needs  Patient education that was completed: NA  Recommendations to the care team: NA    Vital Signs:  Pulse: 101  RR: 25 PER MINUTE  SpO2: 98 %  O2 Device: None (Room air)  Liter Flow:    O2%:      Breath Sounds:   All Breath Sounds: Coarse crackles  Respiratory Effort:   Respiratory Effort/Pattern: Unlabored  Comments:

## 2023-10-24 NOTE — Case Management (ED)
 Case Management Progress Note    NAME:Arel Nicasio Barlowe                          MRN: [id number]              DOB:September 20, 1951          AGE: 73 y.o.  ADMISSION DATE: 10/10/2023             DAYS ADMITTED: LOS: 14 days      Today's Date: 10/24/2023    PLAN: Discharge to inpatient setting anticipated.     Expected Discharge Date: 10/27/2023   Is Patient Medically Stable: No, Please explain: ICU  Are there Barriers to Discharge? no    INTERVENTION/DISPOSITION:  Discharge Planning    SW attended team huddle and reviewed EMR. Pt not ready for discharge until at least the end of the week.     SW met with pt at bedside to discuss likely need for SNF vs IPR at discharge. He voiced understanding. Per EMR, pt has been to [facility] in the past - pt tells SW that he would not return to this facility.    SW tasked CMA to deliver SNF and IPR lists to pt at bedside.     Will follow up when closer to dc to send referrals.                  Transportation              Does the Patient Need Case Management to Arrange Discharge Transport? (ex: facility, ambulance, wheelchair/stretcher, Medicaid, cab, other): No  Will the Patient Use Family Transport?: Yes  Transportation Name, Phone and Availability #1:  ([name] - [phone])  Support                 Info or Referral                 Positive SDOH Domains and Potential Barriers                   Medication Needs                                                                                                                                                         Financial                 Legal                 Other                 Discharge Disposition  Selected Continued Care - Admitted Since 10/10/2023    No services have been selected for the patient.           [name]    [employer]

## 2023-10-24 NOTE — Progress Notes
 Nutrition Support Service (NSS) Progress Note    Recommendations:  Continue TPN until EN vs PO tolerated and meeting at least 65% estimated needs with good tolerance.     When EN desired to initiate, would start with trickle feeds with Nutren 1.5 at 10 ml/hr and as tolerated/able advance to eventual goal of 60 ml/hr +2 pk prosource protein/day (1 pk BID) to provide at goal of 2320 kcals, 137 g protein, and 1108 ml free H2O.   Additional fluids per primary team with minimum of 30 ml H2O q4hrs; estimated maintenance hydration of 150-200 ml h2O q4hrs.       PN therapy start date: 10/13/2023  PN Indication: NPO for 7 days or more (to date or expected)   Estimated PN therapy end date: unknown, when EN vs PO feasible and meeting at least 65% estimated needs consistently     Diet Order: NPO  TF: n/a  Pertinent allergies/intolerances: No known    I/O:  3586 ml/ 4450 ml  NGT: 1875 ml  UOP: 2245 ml  SD: 80 ml Abd Wound: 250 ml     Meds: Scheduled Meds:acetaminophen (OFIRMEV) 1,000 mg injection 100 mL, 1,000 mg, Intravenous, Q6H*  amitriptyline/gabapentin/baclofen(#) 2/6/2 % topical cream, , Topical, Q8H  aspirin rectal suppository 300 mg, 300 mg, Rectal, QDAY  cefTRIAXone (ROCEPHIN) IVP 2 g, 2 g, Intravenous, Q24H*  insulin aspart (U-100) (NOVOLOG FLEXPEN U-100 INSULIN) injection PEN 0-12 Units, 0-12 Units, Subcutaneous, ACHS (22)  lidocaine (LIDODERM) 5 % topical patch 2 patch, 2 patch, Topical, QDAY  methocarbamoL (ROBAXIN) injection 750 mg, 750 mg, Intravenous, Q6H  metroNIDAZOLE (FLAGYL) 500 mg IVPB 100 mL, 500 mg, Intravenous, Q12H*  micafungin (MYCAMINE) 100 mg in sodium chloride 0.9% (NS) 100 mL IVPB (MB+), 100 mg, Intravenous, Q24H*  pantoprazole (PROTONIX) injection 40 mg, 40 mg, Intravenous, QDAY  potassium chloride in water IVPB 10 mEq, 10 mEq, Intravenous, Q30 MIN   Followed by  potassium chloride in water IVPB 10 mEq, 10 mEq, Intravenous, Q30 MIN   Followed by  potassium chloride in water IVPB 10 mEq, 10 mEq, Intravenous, Q30 MIN  sodium bicarbonate 2% 5 mL nebulizer solution 5 mL, 5 mL, Nebulization, Q12H    Continuous Infusions:   Adult Continuous Parenteral Nutrition (PN) 72 mL/hr at 10/23/23 2012    amiodarone (CORDARONE) 360 mg/200 mL D5W IV drip (std conc) (premade) 0.5 mg/min (10/23/23 2354)    heparin (porcine) 20,000 units/D5W 500 mL infusion (std conc)(premade) 1,530 Units/hr (10/24/23 0957)     PRN and Respiratory Meds:albuterol-ipratropium Q4H PRN, artificial tears (PF) single dose PRN, dextrose 50% (D50) IV PRN, heparin (porcine) TITRATE **AND** heparin (porcine) Q6H PRN, morphine  injection syringe Q4H PRN, [DISCONTINUED] ondansetron Q6H PRN **OR** ondansetron (ZOFRAN) IV Q6H PRN, saliva, artificial PRN    Electrolyte Treatments: 20 mEq IV KCl provided 1/26 and 50 mEq IV KCl scheduled so far 1/27    Pertinent Labs:   Comprehensive Metabolic Profile    Lab Results   Component Value Date/Time    NA 150 (H) 10/24/2023 04:17 AM    K 3.5 10/24/2023 04:17 AM    CL 111 (H) 10/24/2023 04:17 AM    CO2 24 10/24/2023 04:17 AM    GAP 15 (H) 10/24/2023 04:17 AM    BUN 87 (H) 10/24/2023 04:17 AM    CR 1.20 10/24/2023 04:17 AM    GLU 153 (H) 10/24/2023 04:17 AM    Lab Results   Component Value Date/Time    CA 8.2 (L) 10/24/2023 04:17  AM    PO4 3.0 10/24/2023 04:17 AM    ALBUMIN 2.4 (L) 10/24/2023 04:17 AM    TOTPROT 5.2 (L) 10/24/2023 04:17 AM    ALKPHOS 122 (H) 10/24/2023 04:17 AM    AST 30 10/24/2023 04:17 AM    ALT 94 (H) 10/24/2023 04:17 AM    TOTBILI 0.6 10/24/2023 04:17 AM    GFR >60 10/24/2023 04:17 AM    GFRAA >60 05/28/2019 06:06 PM        Lab Results   Component Value Date    MG 2.1 10/24/2023     Lab Results   Component Value Date    TRIG 255 (H) 10/21/2023     Lab Results   Component Value Date    GLUPOC 162 (H) 10/24/2023    GLUPOC 147 (H) 10/23/2023    GLUPOC 164 (H) 10/23/2023    GLUPOC 179 (H) 10/23/2023    GLUPOC 151 (H) 10/23/2023       Admit Weight (Kg): Weight: 117.9 kg (260 lb)   Current Weight (Kg): Weight: 101.9 kg (224 lb 10.4 oz)  TPN Dose Weight (Kg): 83.3 kg (desired wt at BMI=24.9)    Estimated Kcal Needs:2100-2330 (25-28 kcal/kg DBW 83.3 kg)  Estimated Protein Needs:125-140g  (1.5-1.7 g/kg DBW 83.3 kg)    Malnutrition Details:  Malnutrition present on admission  ICD-10 code E44: Acute illness/Moderate non-severe malnutrition  Energy Intake: Less than 75% of estimated energy requirement for greater than 7days, Weight loss: 1-2% x 1 week            Loss of Subcutaneous Fat: No      Muscle Wasting: No                   Malnutrition Interventions: TPN.     A/P:   Arville Lime is a 73 y.o. male with a PMH that includes HTN, Afib, CAD s/p PCI w/ stent (2014), dilated cardiomyopathy, PVD, COPD, OSA, tobaccoism who presented with increasing abdominal pain. 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  Now, s/p re-opening of recent laparotomy, enterectomy resection and anastomosis on 1/16.  Started on PN 1/16 due to current nutrition status and anticipated continued NPO status.  (Refer to NSS consult 1/16 for nutrition/wt hx details).      CT obtained 1/20 with decreased opacification of the venous mesenteric vessels, concerning for hypoperfusion. A CTA abdomen was obtained 1/21 showing occlusion of the SMA and celiac axis stents as well as possible pneumatosis of the colon. E-stat to OR 1/21 for reopening laparotomy, found to have failure of small bowel anastomosis, s/p resection, aortomesenteric bypass to hepatic and SMA with 12x6 bifurcated graft; left in discontinuity with abthera placemeent.  S/p OR 1/23 for reopening of recent lap with enterectomy and noted to have ~200 cm bowel remaining, remains in discontinuity with Abtherra replaced.       1/27:  Pt intubated 1/21-1/24, successfully extubated 1/25. Pt s/p OR 1/25 for SBR x2 - proximal jejunum up to the ligament of treitz resected (approximately 20cm) and distal jejunum 3cm resected with anastomosis created at the ligament of treitz; over 200cm of small bowel remains. Corpak placed through the anastomosis in the jejunum. Requiring Day 12 of central PN.   Planning to maintain NPO with TPN with plan for slow PO progression through corpak once regular  BMs; only want trickle feeds at 10 ml/hr for possibly several days to start maybe tomorrow (1/28).  Mixed salts at 14mL/hr discontinued (1/26). Noted Na+ 150 yesterday and again today (1/27); decreased Na+ 1/26 and will proceed with removing today. Will slightly increase PN rate/volume, increase K+ and Mg in next PN.     PN Changes (to start at 20:30):  Increased PN rate from 73 to 80 ml/hr--total volume from 1728 to 1920 ml  Decreased Na+ from 16 to 0 mEq  Increased K+ from 80 to 90 mEq  Increased Mg from 2 to 4 mEq     PN Order (to start at 20:30):  Nonstandard TPN: 80 ml/hr x 24hr (1920 ml total volume)    Macronutrients: AA  130g (goal), Dex 280 g (goal), SMOFlipids 315 ml (goal)   Lytes: Na 0 mEq, K 90 mEq, Ca 10 mEq, Mg 4 mEq, Phos 16 mmol, VH:QIONGEX 1:3  Additives: 10 ml multivitamin, 1ml trace elements, and 100mg  thiamine     PN to provide 2102 kcals (25 kcals/kg; ~100% estimated needs), 130 g AA (1.56 g/kg, 100% estimated needs)    Willette Cluster, MA, RD, LD, CNSC  Available on [facility]

## 2023-10-24 NOTE — Progress Notes
 PHYSICAL THERAPY  PROGRESS NOTE          Name: Blake Decker   MRN: [id number]     DOB: 03-17-1951      Age: 73 y.o.  Admission Date: 10/10/2023     LOS: 14 days     Date of Service: 10/24/2023        Mobility  Patient Turn/Position: Supine  Progressive Mobility Level: Active transfer to chair  Level of Assistance: Assist X2  Assistive Device: None  Activity Limited By: Patient Refused;Patient request to stop;Weakness;Nausea    Subjective  Significant Hospital Events: 73 y.o. 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 and anastomotic leak s/p ex lap, SBR, left in discontinuity, aorto-mesenteric hepatic and SMA bypass with bifurcated graft, cauterization of iatrogenic splenic capsular tear, Abthera placement (1/21), ex lap SBR washout, abthera placement (1/23), ex lap SBR x2 with anastomosis, abdominal closure (1/25)  Mental / Cognitive: Alert;Cooperative;Follows commands  Pain: No complaint of pain  Pain Interventions: Patient agrees to participate in therapy with current pain level;Patient assisted into position of comfort  Persons Present: Rehabilitation technician    Home Living Situation  Lives With: Spouse/significant other  Type of Home: House  Entry Stairs: No stairs  In-Home Stairs: Able to live on main level  Bathroom Setup: Tub only (bathroom N/A with wheelchair)  Patient Owned Equipment: Environmental consultant with wheels;Wheelchair    Prior Level of Function  Level Of Independence: Independent with ADL and household mobility with device  History of Falls in Past 3 Months: Yes  Comments: Patient had a fall at the end of December and sustained a L fibula fracture. Per spouse, he is supposed to be NWB but was walking on it some to get into the bathroom because the wheelchair would not fit into it.  Occupation/Education: Retired  Comments: Spouse reports patient broke his ankle on 12/31 and has not been maintaining his NWB very well.    Precautions  Comments: wound vac, JP  L LE Precautions: LLE non-weight bearing (pt non-compliant)  Comments: recent L fibula fx (around 09/27/23). NWB per spouse. Recommend ortho consult to assess.    Bed Mobility/Transfer  Bed Mobility: Rolling: Minimal Assist  Bed Mobility: Supine to Sit: Moderate Assist;x2 People  Bed Mobility: Sit to Supine: Moderate Assist;x2 People;Assist with Trunk  Comments: pt able to manipulate legs on/off bed with minimal assist  End Of Activity Status: In Bed;Nursing Notified;Instructed Patient to Request Assist with Mobility;Instructed Patient to Use Call Light    Balance  Sitting Balance: Static Sitting Balance;2 UE Support;Minimal Assist;Standby Assist    Activity/Exercise  Sit Edge Of Bed:  (3-5 sec)  Sit Edge Of Bed Assist: Minimal Assist;Stand By Assist  Comments: pt declines to sit EOB for longer than 3-5 sec but unable to state reasoning, declines ther ex or assisting with repositioning    Education  Persons Educated: Patient/Family  Patient Barriers To Learning: None Noted  Interventions: Repetition of Instructions  Teaching Methods: Verbal Instruction  Patient Response: Verbalized Understanding  Topics: Plan/Goals of PT Interventions;Mobility Progression;Importance of Increasing Activity;Up with Assist Only;Safety Awareness    Assessment/Progress  Impaired Mobility Due To: Pain;Decreased Activity Tolerance;Post Surgical Changes  Assessment/Progress: Should Improve w/ Continued  PT    AM-PAC 6 Clicks Basic Mobility Inpatient  Turning from your back to your side while in a flat bed without using bed rails: A lot  Moving from lying on your back to sitting on the side of a flat bed without using bedrails : A Lot  Moving to and from a bed to a chair (including a wheelchair): Total  Standing up from a chair using your arms (e.g. wheelchair, or bedside chair): Total  To walk in hospital room: Total  Climbing 3-5 steps with a railing: Total  Basic Mobility Inpatient Raw Score: 8  Standardized (T-scale) Score: 22.61  Functional Stages - Basic Mobility Score Interpretation  11.95 - 33 Limited Movement: Your score suggests you may have a lot of difficulty or are unable to get out of your bed, to stand for several minutes and/or to walk short distances.   You might have some difficulty completing the most basic mobility tasks including repositioning yourself in bed.    Goals  Goal Formulation: With Patient  Time For Goal Achievement: 7 days  Patient Will Go Supine To/From Sit: w/ Minimal Assist  Patient Will Transfer Bed/Chair: w/ Minimal Assist  Patient Will Transfer Sit to Stand: w/ Minimal Assist  Patient Will Ambulate: 1-10 Feet, w/ Dan Humphreys, w/ Minimal Assist    Plan  Treatment Interventions: Mobility training  Plan Frequency: 5 Days per Week  PT Plan for Next Visit: EOB, trial transfers to chair - squat pivot vs stand pivot vs slideboard    PT Discharge Recommendations  Recommendation: Inpatient setting  Patient Currently Requires Physical Assist With: All mobility      Therapist: Dalphine Handing, PT, DPT  Date: 10/24/2023

## 2023-10-24 NOTE — Progress Notes
 Brief ortho note:      73 y.o.  L ankle fracture s/p ORIF 1/1 @ [facility]     XR obtained on 1/24 of left ankle reviewed with Dr. Cherene Julian, stable.     EXAM: ANKLE MIN 3 VIEWS LEFT    CLINICAL INDICATION: 72 years left ankle films s/p splint exchange:  L  ankle fracture s/p ORIF 1/1 @ [facility].    COMPARISON: 10/16/2023, TIBIA & FIBULA 2 VIEWS LEFT.    IMPRESSION      1.  Overlying splint limits osseous detail. Plate and screw fixation  across oblique distal fibular fracture with tightrope fixation across the  syndesmosis. Near-anatomic alignment.     -NWB LLE in splint  -Keep your surgical dressings and splint clean, dry, intact until follow up.  Report drainage, redness, fevers, or increased pain to our office.  -Do not get the splint wet. Notify orthopedics if this occurs.     -Leave dressings in place until follow up appointment. If any new drainage seen or concerns for infection, please contact Dr. Launa Grill office @ [phone].    -Sponge bathe until follow up appointment.    -Follow up appointment needed with the orthopedics clinic with Dr. Ellery Plunk or his PA Geni Bers, please call [phone] to schedule/confirm this appointment.     -Lovenox for DVT ppx while inpatient unless medically contraindicated, continue this medication until follow up as well.     Should you have a question or concern regarding your orthopedic care or injuries please call our nurse's line at [phone].  Our clinic is located on the 2nd floor of the [facility] on [address], across from the [employer]. The clinic address is [address] [place], [place] [address]    Call [phone] to schedule a 2 week follow up visit from now with Dr. Cherene Julian. I will request an appointment at this time. Call to confirm if the appointment is not made by the time of discharge.       Pilar Corrales, APRN  Nurse Practitioner, Orthopedic Surgery  [facility] ([facility], 6-4)

## 2023-10-24 NOTE — Progress Notes
 OCCUPATIONAL THERAPY  PROGRESS NOTE      Name: Blake Decker   MRN: [id number]     DOB: March 02, 1951      Age: 73 y.o.  Admission Date: 10/10/2023     LOS: 14 days     Date of Service: 10/24/2023    Mobility  Patient Turn/Position: Left  Progressive Mobility Level: Sit on edge of bed  Level of Assistance: Assist X2  Assistive Device: None  Activity Limited By: Brendia Sacks    Subjective  Significant Hospital Events: 74 y.o. 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 and anastomotic leak s/p ex lap, SBR, left in discontinuity, aorto-mesenteric hepatic and SMA bypass with bifurcated graft, cauterization of iatrogenic splenic capsular tear, Abthera placement (1/21), ex lap SBR washout, abthera placement (1/23), ex lap SBR x2 with anastomosis, abdominal closure (1/25)  Mental / Cognitive: Alert;Cooperative;Follows commands  Pain: Complains of pain  Pain level: 8/10  Pain Location: Abdomen;Post-surgical  Pain Interventions: Patient agrees to participate in therapy with current pain level;Patient assisted into position of comfort  Persons Present: Rehabilitation technician    Home Living Situation  Lives With: Spouse/significant other  Type of Home: House  Entry Stairs: No stairs  In-Home Stairs: Able to live on main level  Bathroom Setup: Tub only (bathroom N/A with wheelchair)  Patient Owned Equipment: Environmental consultant with wheels;Wheelchair    Prior Level of Function  Level Of Independence: Independent with ADL and household mobility with device  History of Falls in Past 3 Months: Yes  Comments: Patient had a fall at the end of December and sustained a L fibula fracture. Per spouse, he is supposed to be NWB but was walking on it some to get into the bathroom because the wheelchair would not fit into it.  Occupation/Education: Retired  Comments: Spouse reports patient broke his ankle on 12/31 and has not been maintaining his NWB very well.    Precautions  Comments: wound vac, JP  L LE Precautions: LLE non-weight bearing (pt non-compliant)  Comments: recent L fibula fx (around 09/27/23). NWB per spouse. Recommend ortho consult to assess.    ADL's  Where Assessed: Edge of Bed  Eating Deficits: NPO, but hand to mouth WNL  LE Dressing Assist: Total Assist  LE Dressing Deficits: Don/Doff R Sock    ADL Mobility  Bed Mobility: Supine to Sit: Maximum assist;x2 people  Bed Mobility: Sit to Supine: Maximum assist;x2 people  End of Activity Status: In bed;Instructed patient to request assist with mobility;Instructed patient to use call light;Nursing notified  Transfer Comments: Pt able to scoot along EOB with moderate assist a couple of times, fatigues very quickly.  Sitting Balance: Standby assist;2 UE support    Activity Tolerance  Endurance: 3/5 Tolerates 25-30 Minutes Exercise w/Multiple Rests    Cognition  Overall Cognitive Status: WFL to Adequately Complete Self Care Tasks Safely    Education  Persons Educated: Patient  Teaching Methods: Verbal Instruction  Patient Response: Verbalized Understanding  Topics: Role of OT, Goals for Therapy  Goal Formulation: With Patient    Assessment  Assessment: Decreased ADL Status;Decreased Endurance;Decreased Self-Care Trans;Decreased High-Level ADLs  Prognosis: Good  Goal Formulation: Pt/family    AM-PAC 6 Clicks Daily Activity  Inpatient  Putting on and taking off regular lower body clothes: A Lot  Bathing (Including washing, rinsing, drying): A Lot  Toileting, which includes using toilet, bedpan, or urinal: Total  Putting on and taking off regular upper body clothing: None  Taking care of personal grooming such as brushing teeth: None  Eating meals: None  Daily Activity Raw Score: 17  Standardized (T-scale) Score: 37.26    Plan  OT Frequency: 5x/week  OT Plan for Next Visit: slideboard vs. stand/pivot; toileting on commode    ADL Goals  Patient Will Perform All ADL's: w/ Stand By Assist    Functional Transfer Goals  Pt Will Transfer To Bedside Commode: w/ Stand By Assist    OT Discharge Recommendations  Recommendation: Inpatient setting    Therapist: Nelma Rothman, OTR/L [id number]  Date: 10/24/2023

## 2023-10-24 NOTE — Case Management (ED)
 CMA Note:       Delivered IPR and SNF list to pt bedside per the request from [name], [facility]     Carlynn Spry  Case Management Assistant  For additional assistance please contact SWCM  *

## 2023-10-24 NOTE — Progress Notes
 END OF SHIFT SUMMARY [facility]    Admission Date: 10/10/2023  Length of Stay: LOS: 14 days        Acute events and nursing interventions: Pt restless NOC. Calling out for swabs every 5-10 minutes.      Communication with providers (include name/title): Dr. Roe Rutherford      Did patient sleep overnight? No- slept only in very short periods, PRN administered for insomnia. Eye mask and ear plugs offered.      Patient demeanor and cognition: A & O x4, intermittently restless/irritable.     Pain  Was the patient's pain controlled this shift? Yes  If no, what new interventions were implemented?            Is the patient intubated? No      Patient Education  Patient education provided: Discharge planning and goals and Post procedure care  Other patient education provided:  Learners: Patient  Method/materials used: Verbal teaching  Response to learning: Some Evidence of Learning, Needs Reinforcement    Patient Goal(s):  Patient will Achieve timely healing, Report progressive increase in activity tolerance , and Verbalize understanding of individual dietary and fluid recommendations by discharge date

## 2023-10-25 ENCOUNTER — Encounter: Admit: 2023-10-25 | Discharge: 2023-10-25 | Payer: MEDICARE

## 2023-10-25 ENCOUNTER — Inpatient Hospital Stay: Admit: 2023-10-25 | Discharge: 2023-10-25 | Payer: MEDICARE

## 2023-10-25 LAB — SURGICAL PATHOLOGY

## 2023-10-25 LAB — ECG 12-LEAD
Q-T INTERVAL: 376 ms
QRS DURATION: 106 ms — ABNORMAL HIGH (ref 24.0–36.5)
QTC CALCULATION (BAZETT): 444 ms
R AXIS: -24 degrees
T AXIS: 88 degrees
VENTRICULAR RATE: 84 {beats}/min

## 2023-10-25 LAB — TRIGLYCERIDE: ~~LOC~~ BKR TRIGLYCERIDES: 160 — ABNORMAL HIGH (ref 70–<150)

## 2023-10-25 LAB — PTT (APTT): ~~LOC~~ BKR PTT: 43 s — ABNORMAL HIGH (ref 24.0–36.5)

## 2023-10-25 LAB — POC GLUCOSE
~~LOC~~ BKR POC GLUCOSE: 149 mg/dL — ABNORMAL HIGH (ref 70–100)
~~LOC~~ BKR POC GLUCOSE: 125 mg/dL — ABNORMAL HIGH (ref 70–100)
~~LOC~~ BKR POC GLUCOSE: 134 mg/dL — ABNORMAL HIGH (ref 70–100)
~~LOC~~ BKR POC GLUCOSE: 148 mg/dL — ABNORMAL HIGH (ref 70–100)

## 2023-10-25 LAB — COMPREHENSIVE METABOLIC PANEL
~~LOC~~ BKR CHLORIDE: 112 — ABNORMAL HIGH (ref 98–110)
~~LOC~~ BKR POTASSIUM: 3.3 — ABNORMAL LOW (ref 3.5–5.1)
~~LOC~~ BKR SODIUM, SERUM: 150 — ABNORMAL HIGH (ref 137–147)

## 2023-10-25 LAB — PHOSPHORUS: ~~LOC~~ BKR PHOSPHORUS: 3.8 — ABNORMAL HIGH (ref 2.0–4.5)

## 2023-10-25 MED ORDER — VENLAFAXINE 75 MG PO TAB
75 mg | Freq: Two times a day (BID) | NASOGASTRIC | 0 refills | Status: DC
Start: 2023-10-25 — End: 2023-10-26
  Administered 2023-10-25 (×2): 75 mg via NASOGASTRIC

## 2023-10-25 MED ORDER — ALBUMIN, HUMAN 5 % 250 ML IV SOLP (AN)(OSM)
INTRAVENOUS | 0 refills | Status: DC
Start: 2023-10-25 — End: 2023-10-26

## 2023-10-25 MED ORDER — METHYLPREDNISOLONE SOD SUC(PF) 125 MG/2 ML IJ SOLR
50 mg | INTRAVENOUS | 0 refills | Status: DC
Start: 2023-10-25 — End: 2023-10-26
  Administered 2023-10-26 (×3): 50 mg via INTRAVENOUS

## 2023-10-25 MED ORDER — POTASSIUM CHLORIDE IN WATER 10 MEQ/50 ML IV PGBK
10 meq | INTRAVENOUS | 0 refills | Status: CP
Start: 2023-10-25 — End: ?
  Administered 2023-10-25: 13:00:00 10 meq via INTRAVENOUS

## 2023-10-25 MED ORDER — POTASSIUM CHLORIDE IN WATER 10 MEQ/50 ML IV PGBK
10 meq | INTRAVENOUS | 0 refills | Status: CP
Start: 2023-10-25 — End: ?
  Administered 2023-10-25: 16:00:00 10 meq via INTRAVENOUS

## 2023-10-25 MED ORDER — POTASSIUM CHLORIDE IN WATER 10 MEQ/50 ML IV PGBK
10 meq | INTRAVENOUS | 0 refills | Status: CP
Start: 2023-10-25 — End: ?
  Administered 2023-10-25: 17:00:00 10 meq via INTRAVENOUS

## 2023-10-25 MED ORDER — HEPARIN (PORCINE) IN 5 % DEX 20,000 UNIT/500 ML (40 UNIT/ML) IV SOLP
500 [IU]/h | INTRAVENOUS | 0 refills | Status: DC
Start: 2023-10-25 — End: 2023-10-26
  Administered 2023-10-26: 06:00:00 500 [IU]/h via INTRAVENOUS

## 2023-10-25 MED ORDER — VENLAFAXINE 150 MG PO CP24
150 mg | Freq: Every day | ORAL | 0 refills | Status: DC
Start: 2023-10-25 — End: 2023-10-25

## 2023-10-25 MED ORDER — AMIODARONE 200 MG PO TAB
400 mg | Freq: Two times a day (BID) | NASOGASTRIC | 0 refills | Status: DC
Start: 2023-10-25 — End: 2023-10-26
  Administered 2023-10-25: 16:00:00 400 mg via NASOGASTRIC

## 2023-10-25 MED ORDER — MICAFUNGIN 100MG/100ML NS IVPB (MB+)
100 mg | INTRAVENOUS | 0 refills | Status: DC
Start: 2023-10-25 — End: 2023-10-26
  Administered 2023-10-26 (×2): 100 mg via INTRAVENOUS

## 2023-10-25 MED ORDER — POTASSIUM CHLORIDE IN WATER 10 MEQ/50 ML IV PGBK
10 meq | INTRAVENOUS | 0 refills | Status: CP
Start: 2023-10-25 — End: ?
  Administered 2023-10-25: 14:00:00 10 meq via INTRAVENOUS

## 2023-10-25 MED ORDER — AMIODARONE 200 MG PO TAB
200 mg | Freq: Every day | NASOGASTRIC | 0 refills | Status: DC
Start: 2023-10-25 — End: 2023-10-26

## 2023-10-25 MED ORDER — POTASSIUM CHLORIDE IN WATER 10 MEQ/50 ML IV PGBK
10 meq | INTRAVENOUS | 0 refills | Status: DC
Start: 2023-10-25 — End: 2023-10-25

## 2023-10-25 MED ORDER — ASPIRIN 300 MG RE SUPP
300 mg | Freq: Every day | RECTAL | 0 refills | Status: DC
Start: 2023-10-25 — End: 2023-10-26
  Administered 2023-10-26: 15:00:00 300 mg via RECTAL

## 2023-10-25 MED ORDER — CEFEPIME 2G/100ML NS IVPB (MB+)
2 g | INTRAVENOUS | 0 refills | Status: DC
Start: 2023-10-25 — End: 2023-10-26
  Administered 2023-10-26 (×4): 2 g via INTRAVENOUS

## 2023-10-25 MED ORDER — IMS MIXTURE TEMPLATE
200 mg | Freq: Three times a day (TID) | ORAL | 0 refills | Status: DC
Start: 2023-10-25 — End: 2023-10-25

## 2023-10-25 MED ORDER — ATORVASTATIN 40 MG PO TAB
40 mg | Freq: Every day | ORAL | 0 refills | Status: DC
Start: 2023-10-25 — End: 2023-10-25

## 2023-10-25 MED ORDER — NOREPINEPHRINE IV DRIP D5W STD CONC (AM)(OR)
INTRAVENOUS | 0 refills | Status: DC
Start: 2023-10-25 — End: 2023-10-26
  Administered 2023-10-26: 03:00:00 .5 ug/kg/min via INTRAVENOUS

## 2023-10-25 MED ORDER — LACTATED RINGERS IV SOLP
INTRAVENOUS | 0 refills | Status: DC
Start: 2023-10-25 — End: 2023-10-26

## 2023-10-25 MED ORDER — PANCRELIPASE-SODIUM BICARBONATE 20,880 K UNIT-650 MG
GASTROSTOMY | 0 refills | Status: DC | PRN
Start: 2023-10-25 — End: 2023-10-26

## 2023-10-25 MED ORDER — ACETAMINOPHEN 1,000 MG/100 ML (10 MG/ML) IV SOLN
1000 mg | INTRAVENOUS | 0 refills | Status: DC
Start: 2023-10-25 — End: 2023-10-27
  Administered 2023-10-26 – 2023-10-27 (×4): 1000 mg via INTRAVENOUS

## 2023-10-25 MED ORDER — ROPINIROLE 0.5 MG PO TAB
.5 mg | Freq: Every evening | GASTROSTOMY | 0 refills | Status: DC
Start: 2023-10-25 — End: 2023-10-26

## 2023-10-25 MED ORDER — ONDANSETRON HCL (PF) 4 MG/2 ML IJ SOLN
INTRAVENOUS | 0 refills | Status: DC
Start: 2023-10-25 — End: 2023-10-26

## 2023-10-25 MED ORDER — ASPIRIN 81 MG PO CHEW
81 mg | Freq: Every day | GASTROSTOMY | 0 refills | Status: DC
Start: 2023-10-25 — End: 2023-10-26

## 2023-10-25 MED ORDER — SODIUM CHLORIDE 0.9% IV SOLP
INTRAVENOUS | 0 refills | Status: DC
Start: 2023-10-25 — End: 2023-10-26

## 2023-10-25 MED ORDER — MIDAZOLAM 1 MG/ML IJ SOLN
INTRAVENOUS | 0 refills | Status: DC
Start: 2023-10-25 — End: 2023-10-26

## 2023-10-25 MED ORDER — METHOCARBAMOL 100 MG/ML IJ SOLN
750 mg | INTRAVENOUS | 0 refills | Status: DC | PRN
Start: 2023-10-25 — End: 2023-10-26

## 2023-10-25 MED ORDER — ROCURONIUM 10 MG/ML IV SOLN
INTRAVENOUS | 0 refills | Status: DC
Start: 2023-10-25 — End: 2023-10-26

## 2023-10-25 MED ORDER — DEXAMETHASONE SODIUM PHOSPHATE 4 MG/ML IJ SOLN
INTRAVENOUS | 0 refills | Status: DC
Start: 2023-10-25 — End: 2023-10-26

## 2023-10-25 MED ORDER — LIDOCAINE (PF) 10 MG/ML (1 %) IJ SOLN
Freq: Once | SUBCUTANEOUS | 0 refills | Status: CP | PRN
Start: 2023-10-25 — End: ?

## 2023-10-25 MED ORDER — ROPINIROLE 0.5 MG PO TAB
1 mg | Freq: Every evening | ORAL | 0 refills | Status: DC
Start: 2023-10-25 — End: 2023-10-25

## 2023-10-25 MED ORDER — EPINEPHRINE 0.1MG NS 10ML SYR (AM)(OR)
INTRAVENOUS | 0 refills | Status: DC
Start: 2023-10-25 — End: 2023-10-26
  Administered 2023-10-26 (×2): 20 ug via INTRAVENOUS

## 2023-10-25 MED ORDER — ASPIRIN 300 MG RE SUPP
300 mg | Freq: Every day | RECTAL | 0 refills | Status: DC
Start: 2023-10-25 — End: 2023-10-26

## 2023-10-25 MED ORDER — ARTIFICIAL TEARS (PF) SINGLE DOSE DROPS GROUP
OPHTHALMIC | 0 refills | Status: DC
Start: 2023-10-25 — End: 2023-10-26

## 2023-10-25 MED ORDER — SODIUM BICARBONATE 1 MEQ/ML (8.4 %) IV SOLN
INTRAVENOUS | 0 refills | Status: DC
Start: 2023-10-25 — End: 2023-10-26

## 2023-10-25 MED ORDER — AMIODARONE IN DEXTROSE,ISO-OSM 360 MG/200 ML (1.8 MG/ML) IV SOLN
1 mg/min | INTRAVENOUS | 0 refills | Status: DC
Start: 2023-10-25 — End: 2023-10-26
  Administered 2023-10-26 (×3): 1 mg/min via INTRAVENOUS

## 2023-10-25 MED ORDER — ADULT PN CONTINUOUS-ION BASED
INTRAVENOUS | 0 refills | Status: DC
Start: 2023-10-25 — End: 2023-10-26
  Administered 2023-10-26: 07:00:00 1920.0000 mL via INTRAVENOUS

## 2023-10-25 MED ORDER — HYDROCORTISONE SOD SUCC (PF) 100 MG/2 ML IJ SOLR
INTRAVENOUS | 0 refills | Status: DC
Start: 2023-10-25 — End: 2023-10-26

## 2023-10-25 MED ORDER — IOHEXOL 350 MG IODINE/ML IV SOLN
80 mL | Freq: Once | INTRAVENOUS | 0 refills | Status: CP
Start: 2023-10-25 — End: ?
  Administered 2023-10-25: 80 mL via INTRAVENOUS

## 2023-10-25 MED ORDER — MELATONIN 5 MG PO TAB
10 mg | Freq: Every evening | ORAL | 0 refills | Status: DC
Start: 2023-10-25 — End: 2023-10-25

## 2023-10-25 MED ORDER — IMS MIXTURE TEMPLATE
15 mg | Freq: Three times a day (TID) | GASTROSTOMY | 0 refills | Status: DC
Start: 2023-10-25 — End: 2023-10-26
  Administered 2023-10-25: 21:00:00 15 mg via GASTROSTOMY

## 2023-10-25 MED ORDER — PROPOFOL 10 MG/ML IV EMUL
5-50 ug/kg/min | INTRAVENOUS | 0 refills | Status: DC
Start: 2023-10-25 — End: 2023-10-26
  Administered 2023-10-26 (×2): 20 ug/kg/min via INTRAVENOUS
  Administered 2023-10-26: 09:00:00 30 ug/kg/min via INTRAVENOUS

## 2023-10-25 MED ORDER — MELATONIN 5 MG PO TAB
10 mg | Freq: Every evening | GASTROSTOMY | 0 refills | Status: DC
Start: 2023-10-25 — End: 2023-10-26

## 2023-10-25 MED ORDER — PROPOFOL 10 MG/ML IV EMUL 100 ML (INFUSION)(AM)(OR)
INTRAVENOUS | 0 refills | Status: DC
Start: 2023-10-25 — End: 2023-10-26
  Administered 2023-10-26: 05:00:00 20 ug/kg/min via INTRAVENOUS

## 2023-10-25 MED ORDER — ENOXAPARIN 100 MG/ML SC SYRG
1 mg/kg | Freq: Two times a day (BID) | SUBCUTANEOUS | 0 refills | Status: DC
Start: 2023-10-25 — End: 2023-10-26
  Administered 2023-10-25: 16:00:00 100 mg via SUBCUTANEOUS

## 2023-10-25 MED ORDER — ACETAMINOPHEN 500 MG PO TAB
1000 mg | GASTROSTOMY | 0 refills | Status: DC
Start: 2023-10-25 — End: 2023-10-26

## 2023-10-25 MED ORDER — VASOPRESSIN 20 UNITS IN D5 100ML IV INFUSION
INTRAVENOUS | 0 refills | Status: DC
Start: 2023-10-25 — End: 2023-10-26
  Administered 2023-10-26 (×2): 2.4 [IU]/h via INTRAVENOUS

## 2023-10-25 MED ORDER — VASOPRESSIN IN 0.9 % SOD CHLOR 20 UNIT/100 ML (0.2 UNIT/ML) IV SOLN
1.8 [IU]/h | INTRAVENOUS | 0 refills | Status: DC
Start: 2023-10-25 — End: 2023-10-26
  Administered 2023-10-26 (×2): 1.8 [IU]/h via INTRAVENOUS

## 2023-10-25 MED ORDER — LIDOCAINE (PF) 200 MG/10 ML (2 %) IJ SYRG
INTRAVENOUS | 0 refills | Status: DC
Start: 2023-10-25 — End: 2023-10-26

## 2023-10-25 MED ORDER — ATORVASTATIN 40 MG PO TAB
40 mg | Freq: Every day | GASTROSTOMY | 0 refills | Status: DC
Start: 2023-10-25 — End: 2023-10-26
  Administered 2023-10-25: 18:00:00 40 mg via GASTROSTOMY

## 2023-10-25 MED ORDER — ADULT PN CONTINUOUS-ION BASED
INTRAVENOUS | 0 refills | Status: CN
Start: 2023-10-25 — End: ?

## 2023-10-25 MED ORDER — ETOMIDATE 2 MG/ML IV SOLN
INTRAVENOUS | 0 refills | Status: DC
Start: 2023-10-25 — End: 2023-10-26

## 2023-10-25 MED ORDER — LANSOPRAZOLE 30 MG PO TBLD
30 mg | Freq: Every day | GASTROSTOMY | 0 refills | Status: DC
Start: 2023-10-25 — End: 2023-10-26

## 2023-10-25 MED ORDER — TRAZODONE 50 MG PO TAB
50 mg | Freq: Every evening | ORAL | 0 refills | Status: DC | PRN
Start: 2023-10-25 — End: 2023-10-26

## 2023-10-25 MED ORDER — POTASSIUM CHLORIDE IN WATER 10 MEQ/50 ML IV PGBK
10 meq | INTRAVENOUS | 0 refills | Status: CP
Start: 2023-10-25 — End: ?
  Administered 2023-10-25: 15:00:00 10 meq via INTRAVENOUS

## 2023-10-25 MED ORDER — METHOCARBAMOL 750 MG PO TAB
750 mg | GASTROSTOMY | 0 refills | Status: DC
Start: 2023-10-25 — End: 2023-10-25
  Administered 2023-10-25: 18:00:00 750 mg via GASTROSTOMY

## 2023-10-25 MED ORDER — FENTANYL CITRATE (PF) 50 MCG/ML IJ SOLN
INTRAVENOUS | 0 refills | Status: DC
Start: 2023-10-25 — End: 2023-10-26

## 2023-10-25 MED ORDER — TUBE FEED (ENAR)
0 refills | Status: DC
Start: 2023-10-25 — End: 2023-10-26

## 2023-10-25 MED ORDER — IMS MIXTURE TEMPLATE
15 mg | Freq: Three times a day (TID) | ORAL | 0 refills | Status: DC
Start: 2023-10-25 — End: 2023-10-25

## 2023-10-25 MED ORDER — NOREPINEPHRINE BITARTRATE-D5W 4 MG/250 ML (16 MCG/ML) IV SOLN
0-.5 ug/kg/min | INTRAVENOUS | 0 refills | Status: DC
Start: 2023-10-25 — End: 2023-10-26
  Administered 2023-10-26: 07:00:00 0.16 ug/kg/min via INTRAVENOUS
  Administered 2023-10-26: 06:00:00 0.2 ug/kg/min via INTRAVENOUS

## 2023-10-25 MED ORDER — IMS MIXTURE TEMPLATE
200 mg | Freq: Three times a day (TID) | GASTROSTOMY | 0 refills | Status: DC
Start: 2023-10-25 — End: 2023-10-26
  Administered 2023-10-25 (×2): 200 mg via GASTROSTOMY

## 2023-10-25 MED ORDER — PANTOPRAZOLE 40 MG IV SOLR
40 mg | Freq: Every day | INTRAVENOUS | 0 refills | Status: DC
Start: 2023-10-25 — End: 2023-10-26
  Administered 2023-10-26: 15:00:00 40 mg via INTRAVENOUS

## 2023-10-25 MED ORDER — SODIUM CHLORIDE 0.9 % IJ SOLN
50 mL | Freq: Once | INTRAVENOUS | 0 refills | Status: CP
Start: 2023-10-25 — End: ?
  Administered 2023-10-25: 50 mL via INTRAVENOUS

## 2023-10-25 NOTE — Progress Notes
 PHYSICAL THERAPY  PROGRESS NOTE          Name: Blake Decker   MRN: [id number]     DOB: 02-02-1951      Age: 73 y.o.  Admission Date: 10/10/2023     LOS: 15 days     Date of Service: 10/25/2023        Mobility  Patient Turn/Position: Chair  Progressive Mobility Level: Active transfer to chair  Level of Assistance: Assist X3 or more  Assistive Device: Friction reducing slide sheet  Activity Limited By: Fatigue;Pain;Patient Refused;Shortness of air;Weakness    Subjective  Significant Hospital Events: 73 y.o. 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 and anastomotic leak s/p ex lap, SBR, left in discontinuity, aorto-mesenteric hepatic and SMA bypass with bifurcated graft, cauterization of iatrogenic splenic capsular tear, Abthera placement (1/21), ex lap SBR washout, abthera placement (1/23), ex lap SBR x2 with anastomosis, abdominal closure (1/25)  Mental / Cognitive: Alert;Uncooperative  Pain: Complains of pain;Does not rate pain  Pain level: During activity  Pain Location: Abdomen;Post-surgical  Pain Interventions: Patient agrees to participate in therapy with current pain level;Patient assisted into position of comfort  Persons Present: Rehabilitation technician;Nursing Staff    Home Living Situation  Lives With: Spouse/significant other  Type of Home: House  Entry Stairs: No stairs  In-Home Stairs: Able to live on main level  Bathroom Setup: Tub only (bathroom N/A with wheelchair)  Patient Owned Equipment: Environmental consultant with wheels;Wheelchair    Prior Level of Function  Level Of Independence: Independent with ADL and household mobility with device  History of Falls in Past 3 Months: Yes  Comments: Patient had a fall at the end of December and sustained a L fibula fracture. Per spouse, he is supposed to be NWB but was walking on it some to get into the bathroom because the wheelchair would not fit into it.  Occupation/Education: Retired  Comments: Spouse reports patient broke his ankle on 12/31 and has not been maintaining his NWB very well.    Precautions  L LE Precautions: LLE non-weight bearing (pt non-compliant)  Comments: recent L fibula fx (around 09/27/23). NWB    Bed Mobility/Transfer  Bed Mobility: Rolling: Maximum Assist;x2 People (rolled on/off bedpan, on/off transfer board, encouraged to reach with R arm and push with RLE to assist in roll)  Transfer Type: Horizontal  Transfer: Assistance Level: From;Bed;To;Bed Side Chair;Dependent Assist;x3 People  Transfer: Assistive Device: Sliding Board (transfer board utilized to transfer horizontally across in supine and then chair positioned upright in full erect position)  Transfers: Type Of Assistance: Verbal Cues;Elevated Bed;For Balance;To Maintain Precautions;For Strength Deficit;For Safety Considerations  End Of Activity Status: In Bed;Nursing Notified;Instructed Patient to Request Assist with Mobility;Instructed Patient to Use Call Light    Activity/Exercise  Comments: pt declines to assist with lifting UE or LEs to mobilize or reposition, RR 30-40s throughout session    Education  Persons Educated: Patient/Family  Patient Barriers To Learning: None Noted  Interventions: Repetition of Instructions  Teaching Methods: Verbal Instruction  Patient Response: Verbalized Understanding  Topics: Plan/Goals of PT Interventions;Mobility Progression;Importance of Increasing Activity;Up with Assist Only;Safety Awareness    Assessment/Progress  Impaired Mobility Due To: Decreased Strength;Pain;Weight Bearing Restrictions;Impaired  Balance;Cognitive Deficits;Safety Concerns;Decreased Activity Tolerance;Limited Cooperation;Deconditioning;Post Surgical Changes  Assessment/Progress: Should Improve w/ Continued PT;Expect Slow Progress    AM-PAC 6 Clicks Basic Mobility Inpatient  Turning from your back to your side while in a flat bed without using bed rails: Total  Moving from lying on your back to sitting on the side of a flat bed without using bedrails : Total  Moving to and from a bed to a chair (including a wheelchair): Total  Standing up from a chair using your arms (e.g. wheelchair, or bedside chair): Total  To walk in hospital room: Total  Climbing 3-5 steps with a railing: Total  Basic Mobility Inpatient Raw Score: 6  Standardized (T-scale) Score: 16.59  Functional Stages - Basic Mobility Score Interpretation  11.95 - 33 Limited Movement: Your score suggests you may have a lot of difficulty or are unable to get out of your bed, to stand for several minutes and/or to walk short distances.   You might have some difficulty completing the most basic mobility tasks including repositioning yourself in bed.    Goals  Goal Formulation: With Patient  Time For Goal Achievement: 7 days  Patient Will Go Supine To/From Sit: w/ Minimal Assist  Patient Will Transfer Bed/Chair: w/ Minimal Assist  Patient Will Transfer Sit to Stand: w/ Minimal Assist  Patient Will Ambulate: 1-10 Feet, w/ Dan Humphreys, w/ Minimal Assist    Plan  Treatment Interventions: Mobility training  Plan Frequency: 3-5 Days per Week  PT Plan for Next Visit: out of bed to cardiac chair, active ther ex, sit to stand vs slideboard transfers when able    PT Discharge Recommendations  Recommendation: Inpatient setting  Patient Currently Requires Physical Assist With: All mobility;All personal care ADLs;All home functioning ADLs    Therapist: Dalphine Handing, PT, DPT  Date: 10/25/2023

## 2023-10-25 NOTE — Anesthesia Procedure Notes
Anesthesia Procedure: Arterial Line Placement    A-LINE INSERTION    Date/Time: 10/25/2023 9:04 PM    Patient location: OR  Indications: hemodynamic monitoring      Preprocedure checklist performed: 2 patient identifiers, risks & benefits discussed, patient evaluated, timeout performed, consent obtained and patient being monitored    Sterile technique:  - Proper hand washing  - Cap, mask  - Sterile gloves  - Skin prep for antisepsis        Arterial Line Procedure   Patient sedated: no    Artery prepped with chlorhexidine; skin prep agent completely dried prior to procedure.  Location: radial artery  Laterality: right  Technique: ultrasound  Needle gauge: 20 G  Number of attempts: 1    Procedure Medications    Local Anesthesia: lidocaine PF 1% (10 mg/mL) injection - Subcutaneous   0.5 mL - 10/25/2023 9:04:00 PM    Procedure Outcome  Catheter secured with adhesive dressing applied  Events: no complications noted during insertion and skin intact, warm, and dry    Observation: pt tolerated well        Performed by: Calvert Cantor, DO  Authorized by: Jackquline Bosch, DO

## 2023-10-25 NOTE — Progress Notes
 Surgical Critical Care Progress Note    Today's Date: 10/25/2023   Hospital Day: Hospital Day: 16    History of Present Illness:   Blake Decker is a 73 y.o. 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 and anastomotic leak s/p ex lap, SBR, left in discontinuity, aorto-mesenteric hepatic and SMA bypass with bifurcated graft, cauterization of iatrogenic splenic capsular tear, Abthera placement (1/21), ex lap SBR washout, abthera placement (1/23), ex lap SBR x2 with anastomosis, abdominal closure (1/25)    Patient Active Problem List    Diagnosis Date Noted    Leukocytosis, unspecified 10/14/2023    S/P small bowel resection 10/12/2023    Moderate malnutrition (HCC) 10/11/2023    Abdominal pain, generalized 10/11/2023    Diarrhea 10/11/2023    Mesenteric ischemia (HCC) 10/11/2023    Atrial fibrillation (HCC) 10/11/2023    Acute blood loss anemia 10/11/2023    Severe sepsis with septic shock (HCC) 10/11/2023    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    Arm pain, left 10/04/2018    Acute respiratory failure with hypoxia (HCC) 09/25/2018    Severe sepsis (HCC) 09/25/2018    Olecranon bursitis of left elbow 09/25/2018    Cellulitis 09/23/2018    Atrial fibrillation with RVR (HCC) 09/23/2018    Coronary artery disease involving native coronary artery 09/23/2018    COPD (chronic obstructive pulmonary disease) (HCC) 09/23/2018    OSA (obstructive sleep apnea) 09/23/2018     Assessment & Plan:   Neuro   Acute pain due to trauma  Multimodal pain regimen:  - Tylenol IV 1000mg  q6h   - Robaxin 750 q6h  - Lidocaine topical patch daily  - Morphine prn    Anxiety  - PTA venlafaxine and buspar held     GCS 15    CV   Undifferentiated shock versus hypovolemic versus septic, resolved, HTN, Afib (Xarelto), CAD s/p PCI w/ stent (2014), dilated cardiomyopathy, angio SMA, hepatic artery, celiac stent (1/14) c/b occlusion of celiac and SMA stent s/p  aorto-mesenteric bypass to hepatic and SMA with bifurcated graft (1/21)   - MAP > 65 SBP < 200  - Permissive hypertension due to pale bowel mucosa    - ASA 81  - PTA Metoprolol 5mg  q6h held  - Transition to PO amiodarone load   - Discontinue Heparin gtt    - Start therapeutic lovenox  - Per vasc okay for xarelto and ASA at discharge    - PTA antihypertensives held   - PTA xarelto held    Pulm  Hx of COPD, OSA, tobaccoism   Extubated  94% RA  Duoneb    GI/ Fluids / Electrolytes / Nutrition  Risk for constipation, Risk for electrolyte imbalance, chronic mesenteric ischemia, SMA stenosis s/p ex lap, SBR, in discontinuity ,abthera placement (1/13) s/p ex lap, primary anastomosis, abdominal wall closure (1/16) c/b leak from small bowel anastomosis s/p ex-lap SBR, cauterization of iatrogenic splenic capsular tear, left in discontinuity with abthera placement (1/21  S/p reopening recent laparotomy, SBR, Abthera (1/23) ex lap SBR x2 with anastomosis, abdominal closure (1/25)  - NPO strict  - Will start enteral feeds very slowly at 10cc/hr and maintain that for days since  the inside of the bowel mucosa appeared pale   - Ok for meds per corpak  - PTA Protonix 40 mg daily   - PTA lasix 40 mg currently held  - Last BM: 1/27, has had 3 liquid loose BM  - TPN    - Zofran prn nausea  - Maintain NGT to LIWS: 600 ml output gastric contents (1875)  - Maintain prevena: has had no output   - Maintain JP to bulb suction: 25 ml SS (80)     Interval imaging through hospital course  - CT abdomen/pelvis from 1/20: without significant opacification of the stents suggestive of acute/subacute in-stent thrombosis. There also appears to be diffuse distension of the small and large bowel suggestive of ileus vs potential bowel ischemia  - Obtained CTA abdomen/pelvis on 1/21 with confirmed thrombosis of both celiac and SMA stents and pneumatosis will ESTAT to OR with vascular surgery for bypass, ex lap, possible SBR, possible ostomy, consent was obtained from patient's wife tina     GU    - Cr 1.25 (1.2),  UOP 1.0L  - Fluid status 24 hours: +2655 ml Since Admit: +2297  - Removed foley 1/27, Voiding spontaneously    Heme/ID  Acute blood loss anemia  - Hgb stable at 12.2 (13.3). No clinical signs of overt bleeding. Continue to trend serially. Optimize fluid balance. Monitor stools for signs of occult GI bleeding.    Leukocytosis  - WBC 24.7 (24.2),  Afebrile.   - Cultures 1/13: Urine, no growth. Fecal rotavirus, cryptosporidium, cdiff negative   - Blood cultures 1/20: Negative  - Intraoperative Wound cultures 1/21: Moderate candida glabrata (Nakaseomyces glabrata) , heavy bacteroides thetaiotaomicron   - ABX: Rocephin; Flagyl; micafungin (End date for all 1/29)     Endo  - [facility]      MS  Impaired mobility and activities of daily living  L ankle fracture s/p ORIF 1/1 @ [facility]  Orthopedics consulted:  - Keep the splint CDI to the LLE, report drainage or concerns to orthopedics.     -NWB to LLE, elevate the limb and offload the heel off the end of the pillow vs prevalon to LLE to keep pressure off the heel to prevent skin breakdown.   - XR left ankle ordered s/p splint change.  Orthopedics to follow.   - LLE re splinted and staples removed per ortho  -PT/OT recommend inpatient setting, consulting rehab    Daily Quality Checklist  Lines:   3 Peripheral IV   R IJ central line placed 1/14, art line removed 1/26  Foley Cath  NGT  Activity progressive mobility but NWB LLE   GI Prophylaxis: protonix    DVT Prophylaxis: heparin gtt    Nutrition: TPN   Bowel Regimen: none at this time  Lab Frequency: Daily   Daily CXR: No  Disposition: downgrade to tele status    Subjective  Patient resting comfortably in bed.    Objective:  Physical Exam:  General: No acute distress  HEENT: Normocephalic, atraumatic; mucus membranes moist, NGT in place with gastric contents  Lungs: No increased work of breathing  Heart: Atrial fibrillation, regular rate  Abdomen: Soft, abthera in place holding suction with Sanguinous output, JP in place with SS output  Extremities:  No edema; Warm, without cyanosis, LLE in splint from fibula fracture and staples removed    Labs:    Complete Blood Counts   Recent Labs     10/22/23  1327 10/23/23  0318 10/24/23  0417  10/25/23  0352   HGB 12.8* 13.3* 12.2* 11.8*   HCT 39.1* 39.9* 38.8* 36.8*   WBC 20.90* 24.10* 24.20* 24.70*   PLTCT 200 256 372 446*   MCV 85.0 84.2 86.3 85.3   MCH 27.8 28.0 27.1 27.3   MCHC 32.7 33.3 31.4* 32.0   RDW 16.2* 16.3* 16.2* 16.6*   MPV 9.0 9.1 9.4 9.3     No results for input(s): NEUT, LYMPH, MONO, POIK, OVAL, PLTEST in the last 72 hours.    Invalid input(s): EOSINOPHIL         Chemistry Panel   Recent Labs     10/22/23  1327 10/23/23  0318 10/24/23  0417 10/25/23  0352   NA 146 150* 150* 150*   K 4.8 3.8 3.5 3.3*   CL 109 111* 111* 112*   CO2 25 26 24 23    BUN 70* 67* 87* 99*   CR 1.15 1.00 1.20 1.25*   GLU 253* 167* 153* 118*   GAP 12 13* 15* 15*   GFR >60 >60 >60 >60   MG 2.3 2.2 2.1 2.0   CA 8.2* 8.0* 8.2* 8.4*   PO4 4.9* 2.2 3.0 3.8     Recent Labs     10/22/23  1327 10/23/23  0318 10/24/23  0417 10/25/23  0352   ALKPHOS 117* 111* 122* 100   AST 115* 63* 30 20   ALT 245* 176* 94* 61*   TOTPROT 5.2* 5.3* 5.2* 5.3*   TOTBILI 0.9 0.7 0.6 0.5   ALBUMIN 2.2* 2.2* 2.4* 2.3*          Coagulation Studies   Recent Labs     10/23/23  1359 10/23/23  2015 10/24/23  0417 10/25/23  0352   PTT 50.6* 53.4* 51.9* 43.4*            Vital Signs (24 hours)  BP: (94-150)/(64-93)   Temp:  [36.3 ?C (97.4 ?F)-36.7 ?C (98.1 ?F)]   Pulse:  [91-107]   Respirations:  [17 PER MINUTE-27 PER MINUTE]   SpO2:  [93 %-98 %]   O2 Device: None (Room air)    Medications  Scheduled Meds:acetaminophen (OFIRMEV) 1,000 mg injection 100 mL, 1,000 mg, Intravenous, Q6H*  amitriptyline/gabapentin/baclofen(#) 2/6/2 % topical cream, , Topical, Q8H  aspirin rectal suppository 300 mg, 300 mg, Rectal, QDAY  cefTRIAXone (ROCEPHIN) IVP 2 g, 2 g, Intravenous, Q24H*  insulin aspart (U-100) (NOVOLOG FLEXPEN U-100 INSULIN) injection PEN 0-12 Units, 0-12 Units, Subcutaneous, ACHS (22)  lidocaine (LIDODERM) 5 % topical patch 2 patch, 2 patch, Topical, QDAY  methocarbamoL (ROBAXIN) injection 750 mg, 750 mg, Intravenous, Q6H  metroNIDAZOLE (FLAGYL) 500 mg IVPB 100 mL, 500 mg, Intravenous, Q12H*  micafungin (MYCAMINE) 100 mg in sodium chloride 0.9% (NS) 100 mL IVPB (MB+), 100 mg, Intravenous, Q24H*  pantoprazole (PROTONIX) injection 40 mg, 40 mg, Intravenous, QDAY  potassium chloride in water IVPB 10 mEq, 10 mEq, Intravenous, Q30 MIN   Followed by  potassium chloride in water IVPB 10 mEq, 10 mEq, Intravenous, Q30 MIN   Followed by  potassium chloride in water IVPB 10 mEq, 10 mEq, Intravenous, Q30 MIN   Followed by  potassium chloride in water IVPB 10 mEq, 10 mEq, Intravenous, Q30 MIN   Followed by  potassium chloride in water IVPB 10 mEq, 10 mEq, Intravenous, Q30 MIN   Followed by  potassium chloride in water IVPB 10 mEq, 10 mEq, Intravenous, Q30 MIN   Followed by  potassium chloride in water IVPB 10 mEq,  10 mEq, Intravenous, Q30 MIN   Followed by  potassium chloride in water IVPB 10 mEq, 10 mEq, Intravenous, Q30 MIN  sodium bicarbonate 2% 5 mL nebulizer solution 5 mL, 5 mL, Nebulization, Q12H  SODIUM CHLORIDE 0.9% IV SOLP (Cabinet Override), , , NOW    Continuous Infusions:   Adult Continuous Parenteral Nutrition (PN) 80 mL/hr at 10/24/23 2036    amiodarone (CORDARONE) 360 mg/200 mL D5W IV drip (std conc) (premade) 0.5 mg/min (10/24/23 2355)    heparin (porcine) 20,000 units/D5W 500 mL infusion (std conc)(premade) 1,630 Units/hr (10/25/23 0534)     PRN and Respiratory Meds:albuterol-ipratropium Q4H PRN, artificial tears (PF) single dose PRN, dextrose 50% (D50) IV PRN, heparin (porcine) TITRATE **AND** heparin (porcine) Q6H PRN, morphine  injection syringe Q4H PRN, [DISCONTINUED] ondansetron Q6H PRN **OR** ondansetron (ZOFRAN) IV Q6H PRN, saliva, artificial PRN      I have personally reviewed pertinent labs, medications, radiology, and diagnostic procedures including: active problem list, medication list, allergies, family history, social history, health maintenance, notes from last encounter, lab results, imaging.     Lanae Boast, MD  Electronically Signed  10/25/2023 6:34 AM  Available on Voalte

## 2023-10-25 NOTE — Consults
 Physical Medicine & Rehabilitation Consult Service    Name: Blake Decker   MRN: [id number]     DOB: [birth year]      Age: 73 y.o.  Admission Date: 10/10/2023     LOS: 15 days     Date of Service: 10/25/2023        Date of Service: 10/25/2023  Financial Class: Payor: MEDICARE / Plan: MEDICARE PART A AND B / Product Type: Medicare /   Referring Physician: Trenda Moots, MD  Reason for Consult: evaluate for Post-Acute Rehab/Placement  Precautions: Fall, NPO   Weight Bearing Precautions: LLE non-weight bearing (pt non-compliant)     Assessment & Plan:  Principal Problem:    Superior mesenteric artery stenosis (HCC)  Active Problems:    Moderate malnutrition (HCC)    Abdominal pain, generalized    Diarrhea    Mesenteric ischemia (HCC)    Atrial fibrillation (HCC)    Acute blood loss anemia    Severe sepsis with septic shock (HCC)    S/P small bowel resection    Leukocytosis, unspecified    Closed fracture of shaft of left fibula    Gait abnormality  Impaired mobility/ADLs  Impaired transfers         Blake Decker is a 73 y.o. year old male admitted to [facility] on  10/10/2023 with the following issues:    debility due to acute on chronic mesenteric ischemia s/p ex lap with multiple surgical procedures     Impairments: loss of coordination, pain, poor activity tolerance, and weakness  Activity Limitations:  eating, grooming, bathing, dressing - upper, dressing - lower, bowel control, transfers, ambulation, wheelchair, and stairs  Participation Restrictions: unable to return home safely  Family / Patient Dispositional Goals: return home to previous level of function    Overall Functional Goals  Gait and mobility Min A   Transfers Min A   Upper body dressing SBA   Lower body dressing SBA   Toileting SBA   Bathing SBA   Cognition / Communication Cognition grossly intact          Recommendations:  Post-acute care rehabilitation needs: subacute rehabilitation to continue with physical therapy and occupational therapy for therapy needs along with medical oversight.       Barriers/Facilitators:  Barriers: High burden of care, Medical complexity, Medication education, Patient motivation, and Wound care education  Facilitators: good home setup and good family / social support  Rehabilitation Prognosis: Fair    Tolerance for three hours of therapy a day: Fair      ____________________________________________________________________________    Impaired gait/mobility/transfers:  The patient will benefit from continued work with PT to address mobility deficits    Impaired ADLs:  The patient will benefit from ongoing OT to address functional deficits    Thank you for this consultation.  Please call our consult pager with questions or concerns.    Althea Grimmer, MD  PGY-3 PM&R  Rehab Consult Pager  Available on Voalte    History of Present Illness:    CC: Abdominal pain     Hospital Course: Blake Decker is a pleasant 73 y.o. male with PMH of coronary artery disease, COPD, hypertension, OSA, A-fib, PVD who presented to [facility] with acute on chronic abdominal pain.  Per review, the patient had been experiencing this abdominal pain with fluctuating severity for at least 1 year.  It was reportedly becoming more severe since Christmas.  On the date of admission, he stated the  pain was a 9 out of 10.  It was reported the patient was having 5-10 bowel movements a day.  He also had a decreased appetite in the setting of the pain.  The patient had a hospital course of stay of losing hospital prior to admission to [facility], but was discharged home.  The surgical team was consulted and the patient is now status post ex lap, SBR, in discontinuity, abthera placement (1/13) s/p angio SMA, hepatic artery, celiac stent (1/14) s/p ex lap, primary anastomosis, abdominal wall closure (1/16) c/b occlusion of celiac and SMA stent and anastomotic leak s/p ex lap, SBR, left in discontinuity, aorto-mesenteric hepatic and SMA bypass with bifurcated graft, cauterization of iatrogenic splenic capsular tear, Abthera placement (1/21), ex lap SBR washout, abthera placement (1/23), ex lap SBR x2 with anastomosis, abdominal closure (1/25) in the setting of acute on chronic mesenteric ischemia and SMA stenosis. OF note, the patient has L ankle fracture s/p ORIF 1/1 @ [facility]. He is NWB to LLE, but per chart review does not always comply with this.     The patient was seen alone on encounter.  He stated prior to admission he lived with his wife and she takes good care of him.  He stated that he lives on the main floor of his home.  He would utilize a walker for mobility.  He stated his goal is to get back home with his wife and cats.  He did mention he has had several falls in the last 6 months.  When inquiring about these falls.  The patient did not want to answer my question, but denied that they were due to dizziness.    The primary team has consulted PT and OT, and will continue working with therapies to address functional and mobility deficits, rehab is now consulted for post-acute rehab/placement recommendations.       The patient's family/social support consists of: wife.    Past Medical History:    Abnormal heart rhythms    Atrial fibrillation (HCC)    CAD (coronary artery disease)    COPD (chronic obstructive pulmonary disease) (HCC)    Dilated cardiomyopathy (HCC)    Former tobacco use    Heart disease    Hypertension    Lung disease    Obesity, Class II, BMI 35-39.9    OSA (obstructive sleep apnea)    PVD (peripheral vascular disease) (HCC)    Tobacco abuse        Surgical History:   Procedure Laterality Date    HX CORONARY STENT PLACEMENT  2014    DEBRIDEMENT WOUND DEEP 20 SQ CM OR LESS -UPPER EXTREMITY Left 09/24/2018    Performed by Dorthula Matas, MD at [facility] OR    DEBRIDEMENT WOUND MEDIUM 20 SQ CM OR LESS - UPPER EXTREMITY Left 09/26/2018    Performed by Azzie Glatter, MD at [facility] OR    NEGATIVE PRESSURE WOUND THERAPY FOR WOUND AREA 50 SQ CM OR LESS Left 09/26/2018    Performed by Azzie Glatter, MD at [facility] OR    DEBRIDEMENT WOUND DEEP 20 SQ CM OR LESS -UPPER EXTREMITY, SECONDARY WOUND CLOSURE Left 09/29/2018    Performed by Azzie Glatter, MD at [facility] OR    DIAGNOSTIC LAPAROSCOPY, LAPAROSCOPIC ENTERECTOMY CONVERTED TO LAPAROTOMY N/A 10/10/2023    Performed by Trenda Moots, MD at [facility] OR    REOPENING RECENT LAPAROTOMY N/A 10/13/2023    Performed by Ronn Melena, MD at [facility] OR  ENTERECTOMY RESECTION AND ANASTOMOSIS N/A 10/13/2023    Performed by Ronn Melena, MD at [facility] OR    REPAIR RUPTURED SPLEEN N/A 10/18/2023    Performed by Deatra Canter, DO at [facility] OR    ENTERECTOMY RESECTION AND ANASTOMOSIS N/A 10/18/2023    Performed by Deatra Canter, DO at [facility] OR    AORTOMESENTERIC BYPASS TO HEPATIC AND SMA WITH 12X6 BIFURCATED GRAFT  10/18/2023    Performed by Dewitt Rota, DO at [facility] OR    REOPENING RECENT LAPAROTOMY, enterectomy N/A 10/20/2023    Performed by Deatra Canter, DO at [facility] OR    ENTERECTOMY RESECTION AND ANASTOMOSIS N/A 10/22/2023    Performed by Deatra Canter, DO at [facility] OR    ENTERECTOMY RESECTION AND ANASTOMOSIS - EACH ADDITIONAL  10/22/2023    Performed by Deatra Canter, DO at [facility] OR    REMOVAL FOREIGN BODY MUSCLE/ TENDON SHEATH - SIMPLE  10/22/2023    Performed by Deatra Canter, DO at [facility] OR        Social History     Socioeconomic History    Marital status: Married   Tobacco Use    Smoking status: Former     Current packs/day: 0.00     Average packs/day: 1 pack/day for 34.0 years (34.0 ttl pk-yrs)     Types: Cigarettes     Start date: 06/09/1984     Quit date: 06/09/2018     Years since quitting: 5.3    Smokeless tobacco: Never   Vaping Use    Vaping status: Never Used   Substance and Sexual Activity    Alcohol use: Not Currently    Drug use: Never       Family history reviewed; non-contributory     Scheduled Meds:acetaminophen (TYLENOL EXTRA STRENGTH) tablet 1,000 mg, 1,000 mg, Feeding Tube, Q6H*  amiodarone (CORDARONE) tablet 400 mg, 400 mg, Per NG tube, BID   Followed by  Melene Muller ON 10/29/2023] amiodarone (CORDARONE) tablet 200 mg, 200 mg, Per NG tube, QDAY  amitriptyline/gabapentin/baclofen(#) 2/6/2 % topical cream, , Topical, Q8H  [START ON 10/26/2023] aspirin chewable tablet 81 mg, 81 mg, Feeding Tube, QDAY  atorvastatin (LIPITOR) tablet 40 mg, 40 mg, Feeding Tube, QDAY  busPIRone (BUSPAR) tablet 15 mg, 15 mg, Feeding Tube, TID  cefTRIAXone (ROCEPHIN) IVP 2 g, 2 g, Intravenous, Q24H*  enoxaparin (LOVENOX) syringe 100 mg, 1 mg/kg, Subcutaneous, BID  insulin aspart (U-100) (NOVOLOG FLEXPEN U-100 INSULIN) injection PEN 0-12 Units, 0-12 Units, Subcutaneous, ACHS (22)  [START ON 10/26/2023] lansoprazole (PREVACID SOLUTAB) disintegrating tablet 30 mg, 30 mg, Feeding Tube, QDAY(07)  lidocaine (LIDODERM) 5 % topical patch 2 patch, 2 patch, Topical, QDAY  melatonin tablet 10 mg, 10 mg, Feeding Tube, QHS  methocarbamoL (ROBAXIN) tablet 750 mg, 750 mg, Feeding Tube, Q6H  metroNIDAZOLE (FLAGYL) 500 mg IVPB 100 mL, 500 mg, Intravenous, Q12H*  pregabalin (LYRICA) capsule 200 mg, 200 mg, Feeding Tube, TID  rOPINIRole (REQUIP) tablet 0.5 mg, 0.5 mg, Feeding Tube, QHS  sodium bicarbonate 2% 5 mL nebulizer solution 5 mL, 5 mL, Nebulization, Q12H  venlafaxine (EFFEXOR) tablet 75 mg, 75 mg, Per Corpak Tube, BID w/meals    Continuous Infusions:   Adult Continuous Parenteral Nutrition (PN)      Adult Continuous Parenteral Nutrition (PN) 80 mL/hr at 10/24/23 2036    Diet Trophic Feeding 10 mL/hr at 10/25/23 1135     PRN and Respiratory Meds:albuterol-ipratropium Q4H PRN, artificial tears (PF) single dose PRN, dextrose 50% (  D50) IV PRN, morphine  injection syringe Q4H PRN, [DISCONTINUED] ondansetron Q6H PRN **OR** ondansetron (ZOFRAN) IV Q6H PRN, pancrelipase 20,880 Units/sodium bicarbonate 650 mg ([place] CLOG DESTROYER) PRN (On Call from Rx), saliva, artificial PRN, traZODone QHS PRN       Allergies   Allergen Reactions    Dilaudid [Hydromorphone] AGITATION     WIFE REPORTS PT BECOMING HOSTILE AND COMBATIVE P DILAUDID ADMINISTRATION.  DID OKAY C MORPHINE AND FENTANYL    Hydrochlorothiazide SEE COMMENTS     Acute renal insufficiency                 Prior Level of Function:     Independent with ADL and household mobility with device      Home Environment:  No data recorded  No data recorded  Type of Home: House (10/25/2023 11:00 AM)    Entry Stairs: No stairs (10/25/2023 11:00 AM)    In-Home Stairs: Able to live on main level (10/25/2023 11:00 AM)    No data recorded  No data recorded  @ADDRESSFULL @      Current Level Of Function:  PT Gait:       Bed Mobility/Transfers  Bed Mobility: Rolling: Maximum Assist, x2 People (rolled on/off bedpan, on/off transfer board, encouraged to reach with R arm and push with RLE to assist in roll)  Bed Mobility: Supine to Sit: Moderate Assist, x2 People  Bed Mobility: Sit to Supine: Moderate Assist, x2 People, Assist with Trunk  Comments: pt able to manipulate legs on/off bed with minimal assist  Transfer Type: Horizontal  Transfer: Assistance Level: From, Bed, To, Bed Side Chair, Dependent Assist, x3 People  Transfer: Assistive Device: Sliding Board (transfer board utilized to transfer horizontally across in supine and then chair positioned upright in full erect position)  Transfers: Type Of Assistance: Verbal Cues, Elevated Bed, For Balance, To Maintain Precautions, For Strength Deficit, For Safety Considerations  End Of Activity Status: In Bed, Nursing Notified, Instructed Patient to Request Assist with Mobility, Instructed Patient to Use Call Light   OT ADL's  Where Assessed: Edge of Bed  Eating Deficits: NPO, but hand to mouth WNL  LE Dressing Assist: Total Assist  LE Dressing Deficits: Don/Doff R Sock  Toileting Assist: Total Assist  Toileting Deficits:  (foley)  Comment: Tolerated sitting EOB ~8 minutes with varrying mod-max assist while nursing staff assisted with bed bath. Patient leaning posteriorly and pushing. Unsure if due to pain or weakness.   SLP COGNITIVE EVALUATION SUMMARY     PRAGMATICS:    BEHAVIOR:    AUDITORY COMPREHENSION:      ORIENTATION:    AUDITORY ATTENTION/WORKING MEMORY:    AUDITORY MEMORY/SUSTAINED ATTENTION:    NEW LEARNING:    SEQUENCING/ORGANIZATION:    PROBLEM SOLVING:    REASONING:      MATH/MONEY SKILLS:      VISUAL PERCEPTUAL:    SWALLOW EVALUATION SUMMARY                       Review of Systems:    A 14 point review of systems was negative except for: that noted in the HPI      Physical Exam:    BP: 125/83 (01/28 0900)  Temp: 36.6 ?C (97.9 ?F) (01/28 0800)  Pulse: 101 (01/28 0900)  Respirations: 24 PER MINUTE (01/28 0900)  SpO2: 88 % (01/28 0900)  O2 Device: None (Room air) (01/28 0900)  Body mass index is 30.46 kg/m?.     Gen: Alert &  Oriented to name DOB, location, month year. The patient occasionally would opt out of exam maneuvers asked of him.   HEENT: EOMI, NG tube and corpak in place  Neck: Supple  Heart: Extremities well perfused  Lungs: non labored breathing  Abdomen: surgical drains   GU:  Foley  Skin: no gross lesions appreciated  Ext: purposeful movement of extremities   MS:   Root Right Left   Shoulder Abduction C5 5 5   Elbow Flexion C5 5 5   Elbow Extension C7 5 5   Wrist Extension C6 5 5     At least antigravity bilateral Lower extremities. LLE with splint wrapped with ace bandage.       Neuro:  Upper Extremity Tone Normal   Lower Extremity Tone Normal   Upper Extremity Sensation Intact to light touch bilaterally   Lower Extremity Sensation Intact to light touch bilaterally   Memory/Cognition/Speech Alert & Oriented to name DOB, location, month year.            Intake/Output Summary (Last 24 hours) at 10/25/2023 1220  Last data filed at 10/25/2023 0930  Gross per 24 hour   Intake 3435.84 ml   Output 1626 ml   Net 1809.84 ml        Hematology:    Lab Results   Component Value Date    HGB 11.8 10/25/2023    HGB 14.2 05/28/2019    HCT 36.8 10/25/2023    HCT 43.3 05/28/2019    PLTCT 446 10/25/2023    PLTCT 227 05/28/2019    WBC 24.70 10/25/2023    WBC 6.1 05/28/2019    NEUT 85 10/10/2023    NEUT 63 05/28/2019    ANC 20.3 10/17/2023    ANC 3.84 05/28/2019    LYMPH 1 10/17/2023    LYMPH 22 10/01/2018    ALC 1.00 10/10/2023    ALC 1.26 05/28/2019    MONA 9 10/10/2023    MONA 12 05/28/2019    AMC 1.60 10/10/2023    AMC 0.73 05/28/2019    EOSA 0 10/10/2023    EOSA 3 05/28/2019    ABC 0.10 10/10/2023    ABC 0.05 05/28/2019    BASOPHILS 1 09/29/2018    MCV 85.3 10/25/2023    MCV 86.5 05/28/2019    MCH 27.3 10/25/2023    MCH 28.3 05/28/2019    MCHC 32.0 10/25/2023    MCHC 32.7 05/28/2019    MPV 9.3 10/25/2023    MPV 7.4 05/28/2019    RDW 16.6 10/25/2023    RDW 16.7 05/28/2019   , Coagulation:    Lab Results   Component Value Date    PT 16.1 10/18/2023    PTT 43.4 10/25/2023    PTT 33.3 05/28/2019    INR 1.5 10/18/2023    INR 1.1 05/28/2019    and General Chemistry:    Lab Results   Component Value Date    NA 150 10/25/2023    NA 148 10/18/2023    NA 142 05/28/2019    K 3.3 10/25/2023    K 4.1 10/18/2023    K 4.1 05/28/2019    CL 112 10/25/2023    CL 102 05/28/2019    CO2 23 10/25/2023    CO2 31 05/28/2019    GAP 15 10/25/2023    GAP 9 05/28/2019    BUN 99 10/25/2023    BUN 7 05/28/2019    CR 1.25 10/25/2023    CR 1.0 05/28/2019    CR  1.00 05/28/2019    GLU 118 10/25/2023    GLU 231 10/18/2023    GLU 95 05/28/2019    CA 8.4 10/25/2023    CA 9.4 05/28/2019    ALBUMIN 2.3 10/25/2023    ALBUMIN 3.6 05/28/2019    LACTIC 1.6 10/22/2023    LACTIC 0.8 09/24/2018    OBSCA 1.09 10/22/2023    MG 2.0 10/25/2023    MG 2.0 10/01/2018    TOTBILI 0.5 10/25/2023    TOTBILI 0.6 05/28/2019    PO4 3.8 10/25/2023    PO4 4.0 09/24/2018        Radiology:      CT Abdomen and Pelvis 1/23:    IMPRESSION       1. Gas and fluid distention of small and large bowel loops throughout the   abdomen with mild dilatation of several small bowel loops in the left   abdomen, nonspecific and could be seen with acute   gastrointestinal/diarrheal illness or ileus. However, given questionable   early pneumatosis involving a left abdominal small bowel loop, ischemia is   an additional consideration. No hypoenhancing bowel segment or   pneumoperitoneum. Clinical and laboratory correlation recommended.     2.  Progression of now severe stenosis of the proximal SMA and moderate to   severe stenosis of the more distal SMA with preserved downstream   opacification. No discrete thrombus. Similar severe stenosis of the celiac   origin. At least mild stenosis of the IMA origin.     3.  Development of small intermediate density right renal lesion which   could represent hemorrhagic/proteinaceous cyst or hypovascular solid renal   mass. Follow up CT or MRI abdomen (renal mass protocol) in 3-6 months   recommended for reevaluation.     4.  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     5.  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      Althea Grimmer, MD

## 2023-10-25 NOTE — Progress Notes
 Update:  Discussed with patient, wife, daughter, and sister in person. Explained my concerns for perforation and my recommendation that we return to the OR urgently to get source control. Discussed that from my review of the operative notes as well as my discussion with Dr. Beryl Meager, his anastomosis (if its the source of the leak) is fairly proximal. If it needs resection then reconstruction might be difficult as we progress more proximally. At first the patient wanted to wait until tomorrow for surgery. On my assessment he is A/O x 3 and understands the risks. I explained the risks of waiting including worsening clinical status, more difficult operation in the future, and death. We also discussed the option of turning this into a controlled enterocutaneous fistula. I counseled them that I did not recommend this as he still has surgical options but that it was an option that we could consider. I answered their questions to the best of my ability and left him to discuss matters with this family.     I returned about 20-30 minutes later and the patient had decided to pursue operative intervention this evening. I discussed the risks of surgery including bleeding, infection, damage to adjacent structures or to other bowel while re-entering his abdomen. Likely abthera depending on findings.     ESTAT to OR for exploration and bowel resection.     Weber Cooks, MD

## 2023-10-25 NOTE — Progress Notes
 Update:  Was informed of new onset bilious output from the patient's drain. Stat CTA demonstrates that the graft is patent and I confirmed there were no issues with Dr. Naomie Dean. On my exam, the patient is awake / alert and off pressors. His abdomen is appropriately tender to palpation and the drain is obviously bilious (pictures in media tab). Clearly I'm concerned that he is leaking from his anastomosis or has another proximal perforation of some sort. Discussed my concerns with the patient. When I spoke to him about going to the OR again, he simply shook his head and asked where is my wife? and then asked me to call her.  I called his wife and told her my concerns.  She is on her way to the hospital and will be here in about an hour.      I am willing to take him to the OR tonight but I think it is reasonable to allow the patient and his wife to discuss since he has had a complicated course requiring multiple operations. I think the relatively short delay will have minimal impact on his outcome. The drain seems to be controlling the leak relatively well for now.     Weber Cooks, MD

## 2023-10-25 NOTE — Progress Notes
 Nutrition Support Service (NSS) Progress Note    Recommendations:  Continue TPN until EN vs PO tolerated and meeting at least 65% estimated needs with good tolerance.     Initiate trickle feeds with Nutren 1.5 at 10 ml/hr; when able and as tolerated advance to eventual goal of 60 ml/hr +2 pk prosource protein/day (1 pk BID) to provide at goal of 2320 kcals, 137 g protein, and 1108 ml free H2O.   Additional fluids per primary team with minimum of 30 ml H2O q4hrs; estimated maintenance hydration of 150-200 ml h2O q4hrs.       PN therapy start date: 10/13/2023  PN Indication: NPO for 7 days or more (to date or expected)   Estimated PN therapy end date: unknown, when EN vs PO feasible and meeting at least 65% estimated needs consistently     Diet Order: NPO  TF: Trophic feeds with Nutren 1.5 at 10 ml/hr  Pertinent allergies/intolerances: No known    I/O:  4346 ml/ 1691 ml  NGT: 600 ml  UOP: 1045 ml, x9 occurrences  SD: 45 ml Abd Wound: none documented BM x5     Meds: Scheduled Meds:acetaminophen (TYLENOL EXTRA STRENGTH) tablet 1,000 mg, 1,000 mg, Feeding Tube, Q6H*  amiodarone (CORDARONE) tablet 400 mg, 400 mg, Per NG tube, BID   Followed by  Melene Muller ON 10/29/2023] amiodarone (CORDARONE) tablet 200 mg, 200 mg, Per NG tube, QDAY  amitriptyline/gabapentin/baclofen(#) 2/6/2 % topical cream, , Topical, Q8H  [START ON 10/26/2023] aspirin chewable tablet 81 mg, 81 mg, Feeding Tube, QDAY  cefTRIAXone (ROCEPHIN) IVP 2 g, 2 g, Intravenous, Q24H*  enoxaparin (LOVENOX) syringe 100 mg, 1 mg/kg, Subcutaneous, BID  insulin aspart (U-100) (NOVOLOG FLEXPEN U-100 INSULIN) injection PEN 0-12 Units, 0-12 Units, Subcutaneous, ACHS (22)  [START ON 10/26/2023] lansoprazole (PREVACID SOLUTAB) disintegrating tablet 30 mg, 30 mg, Feeding Tube, QDAY(07)  lidocaine (LIDODERM) 5 % topical patch 2 patch, 2 patch, Topical, QDAY  methocarbamoL (ROBAXIN) tablet 750 mg, 750 mg, Feeding Tube, Q6H  metroNIDAZOLE (FLAGYL) 500 mg IVPB 100 mL, 500 mg, Intravenous, Q12H*  micafungin (MYCAMINE) 100 mg in sodium chloride 0.9% (NS) 100 mL IVPB (MB+), 100 mg, Intravenous, Q24H*  potassium chloride in water IVPB 10 mEq, 10 mEq, Intravenous, Q30 MIN   Followed by  potassium chloride in water IVPB 10 mEq, 10 mEq, Intravenous, Q30 MIN  sodium bicarbonate 2% 5 mL nebulizer solution 5 mL, 5 mL, Nebulization, Q12H    Continuous Infusions:   Adult Continuous Parenteral Nutrition (PN)      Adult Continuous Parenteral Nutrition (PN) 80 mL/hr at 10/24/23 2036    Diet Trophic Feeding       PRN and Respiratory Meds:albuterol-ipratropium Q4H PRN, artificial tears (PF) single dose PRN, dextrose 50% (D50) IV PRN, morphine  injection syringe Q4H PRN, [DISCONTINUED] ondansetron Q6H PRN **OR** ondansetron (ZOFRAN) IV Q6H PRN, pancrelipase 20,880 Units/sodium bicarbonate 650 mg ([place] CLOG DESTROYER) PRN (On Call from Rx), saliva, artificial PRN    Electrolyte Treatments: 50 mEq IV KCl 1/27; 80 mEq IV KCl  1/28    Pertinent Labs:   Comprehensive Metabolic Profile    Lab Results   Component Value Date/Time    NA 150 (H) 10/25/2023 03:52 AM    K 3.3 (L) 10/25/2023 03:52 AM    CL 112 (H) 10/25/2023 03:52 AM    CO2 23 10/25/2023 03:52 AM    GAP 15 (H) 10/25/2023 03:52 AM    BUN 99 (H) 10/25/2023 03:52 AM    CR 1.25 (H)  10/25/2023 03:52 AM    GLU 118 (H) 10/25/2023 03:52 AM    Lab Results   Component Value Date/Time    CA 8.4 (L) 10/25/2023 03:52 AM    PO4 3.8 10/25/2023 03:52 AM    ALBUMIN 2.3 (L) 10/25/2023 03:52 AM    TOTPROT 5.3 (L) 10/25/2023 03:52 AM    ALKPHOS 100 10/25/2023 03:52 AM    AST 20 10/25/2023 03:52 AM    ALT 61 (H) 10/25/2023 03:52 AM    TOTBILI 0.5 10/25/2023 03:52 AM    GFR >60 10/25/2023 03:52 AM    GFRAA >60 05/28/2019 06:06 PM        Lab Results   Component Value Date    MG 2.0 10/25/2023     Lab Results   Component Value Date    TRIG 160 (H) 10/25/2023     Lab Results   Component Value Date    GLUPOC 125 (H) 10/25/2023    GLUPOC 149 (H) 10/24/2023    GLUPOC 141 (H) 10/24/2023    GLUPOC 182 (H) 10/24/2023    GLUPOC 162 (H) 10/24/2023       Admit Weight (Kg): Weight: 117.9 kg (260 lb)   Current Weight (Kg): Weight: 101.9 kg (224 lb 10.4 oz)  TPN Dose Weight (Kg): 83.3 kg (desired wt at BMI=24.9)    Estimated Kcal Needs:2100-2330 (25-28 kcal/kg DBW 83.3 kg)  Estimated Protein Needs:125-140g  (1.5-1.7 g/kg DBW 83.3 kg)    Malnutrition Details:  Malnutrition present on admission  ICD-10 code E44: Acute illness/Moderate non-severe malnutrition  Energy Intake: Less than 75% of estimated energy requirement for greater than 7days, Weight loss: 1-2% x 1 week            Loss of Subcutaneous Fat: No      Muscle Wasting: No                   Malnutrition Interventions: TPN.     A/P:   Blake Decker is a 73 y.o. male with a PMH that includes HTN, Afib, CAD s/p PCI w/ stent (2014), dilated cardiomyopathy, PVD, COPD, OSA, tobaccoism who presented with increasing abdominal pain. 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  Now, s/p re-opening of recent laparotomy, enterectomy resection and anastomosis on 1/16.  Started on PN 1/16 due to current nutrition status and anticipated continued NPO status.  (Refer to NSS consult 1/16 for nutrition/wt hx details).      CT obtained 1/20 with decreased opacification of the venous mesenteric vessels, concerning for hypoperfusion. A CTA abdomen was obtained 1/21 showing occlusion of the SMA and celiac axis stents as well as possible pneumatosis of the colon. E-stat to OR 1/21 for reopening laparotomy, found to have failure of small bowel anastomosis, s/p resection, aortomesenteric bypass to hepatic and SMA with 12x6 bifurcated graft; left in discontinuity with abthera placemeent.  S/p OR 1/23 for reopening of recent lap with enterectomy and noted to have ~200 cm bowel remaining, remains in discontinuity with Abtherra replaced.       1/28:  Pt intubated 1/21-1/24, successfully extubated 1/25. Pt s/p OR 1/25 for SBR x2 - proximal jejunum up to the ligament of treitz resected (approximately 20cm) and distal jejunum 3cm resected with anastomosis created at the ligament of treitz; over 200cm of small bowel remains. Corpak placed through  the anastomosis in the jejunum. Requiring Day 13 of central PN.   Planing to start PO meds and trickle feeds at 10 ml/hr today (1/28) with plan for very slow, cautious advancement.  Mixed salts at 49mL/hr discontinued (1/26). Noted Na+ in PN stable at 150 (1/26-1/28); decreased Na+ 1/26 and removed 1/27 with slight increase PN rate/volume. Will maintain no sodium and PN volume the same 1/28. Will increase K+ and Mg slightly and decrease phosphorus.  Will adjust to max acetate and remove thiamine.      PN Changes (to start at 20:30):  Increased K+ from 90 to 100 mEq  Increased Mg from 4 to 8 mEq  Decreased phosphorus from 16 to 12 mmol  Adjusted to Max acetate additives  Removed supplemental thiamine     PN Order (to start at 20:30):  Nonstandard TPN: 80 ml/hr x 24hr (1920 ml total volume)    Macronutrients: AA  130g (goal), Dex 280 g (goal), SMOFlipids 315 ml (goal)   Lytes: Na 0 mEq, K 100 mEq, Ca 10 mEq, Mg 8 mEq, Phos 12 mmol, Max acetate additives  Additives: 10 ml multivitamin, 1ml trace elements     PN to provide 2102 kcals (25 kcals/kg; ~100% estimated needs), 130 g AA (1.56 g/kg, 100% estimated needs)    Willette Cluster, MA, RD, LD, CNSC  Available on [facility]

## 2023-10-25 NOTE — Anesthesia Pre-Procedure Evaluation
Anesthesia Pre-Procedure Evaluation    Name: Blake Decker      MRN: [id number]     DOB: 18-May-1951     Age: 73 y.o.     Sex: male   _________________________________________________________________________     Procedure Info:   Procedure Information       Date/Time: 10/25/23 2100    Procedure: EXPLORATORY LAPAROTOMY, POSSIBLE BOWEL RESECTION, POSSIBLE ABTHERA, POSSIBLE CLOSURE , POSSIBLE EGD, POSSIBLE OSTOMY    Location: MAIN OR 06 / Main OR/Periop    Surgeons: Weber Cooks, MD            Physical Assessment  Vital Signs (last filed in past 24 hours):  BP: 91/58 (01/28 2000)  Temp: 36.8 ?C (98.2 ?F) (01/28 1600)  Pulse: 98 (01/28 2032)  Respirations: 23 PER MINUTE (01/28 2032)  SpO2: 97 % (01/28 1750)  O2 Device: None (Room air) (01/28 2032)     Glucose, POC   Date/Time Value Ref Range Status   10/25/2023 1627 148 (H) 70 - 100 mg/dL Final      Patient History   Allergies   Allergen Reactions    Dilaudid [Hydromorphone] AGITATION     WIFE REPORTS PT BECOMING HOSTILE AND COMBATIVE P DILAUDID ADMINISTRATION.  DID OKAY C MORPHINE AND FENTANYL    Hydrochlorothiazide SEE COMMENTS     Acute renal insufficiency        Current Medications    Medication Directions   amLODIPine (NORVASC) 5 mg tablet Take one tablet by mouth daily.   aspirin EC 81 mg tablet Take one tablet by mouth daily. Take with food.   atorvastatin (LIPITOR) 40 mg tablet Take one tablet by mouth daily.   busPIRone (BUSPAR) 15 mg tablet Take one tablet by mouth three times daily.   digoxin (LANOXIN) 125 mcg (0.125 mg) tablet Take one tablet by mouth daily.   furosemide (LASIX) 40 mg tablet Take one tablet by mouth every morning.   lisinopril (PRINIVIL; ZESTRIL) 20 mg tablet Take one tablet by mouth twice daily.   metoprolol tartrate (LOPRESSOR) 50 mg tablet Take one tablet by mouth twice daily.   MULTIVITAMIN PO Take 1 tablet by mouth daily.   nitroglycerin (NITROSTAT) 0.4 mg tablet Place one tablet under tongue every 5 minutes as needed for Chest Pain. Max of 3 tablets, call 911.   oxyCODONE 10 mg tablet Take one tablet by mouth three times daily as needed for Pain.   pantoprazole DR (PROTONIX) 40 mg tablet Take one tablet by mouth daily before breakfast.   potassium chloride (K-DUR) 10 mEq tablet Take one tablet by mouth daily.   pregabalin (LYRICA) 200 mg capsule Take one capsule by mouth three times daily.   rivaroxaban (XARELTO) 20 mg tablet Take one tablet by mouth daily. Take with food.   rOPINIRole (REQUIP) 1 mg tablet Take one tablet by mouth at bedtime daily.   tadalafiL (CIALIS) 20 mg tablet Take one tablet by mouth as Needed.   tamsulosin (FLOMAX) 0.4 mg capsule Take one capsule by mouth daily. Do not crush, chew or open capsules. Take 30 minutes following the same meal each day.   venlafaxine XR (EFFEXOR XR) 150 mg capsule Take one capsule by mouth daily. Take with food.       Review of Systems/Medical History      Patient summary reviewed  Pertinent labs reviewed    PONV Screening: Non-smoker    No history of anesthetic complications    No family history of anesthetic complications  Pulmonary       Current smoker        COPD       Home oxygen use      Acute hypoxic resp failure      Cardiovascular       Recent diagnostic studies:          echocardiogram          ?  The left ventricular size is normal. Wall thickness is increased. Concentric hypertrophy. The left ventricular systolic function is normal. The visually estimated ejection fraction is 55%. There are segmental wall motion abnormalities, as described below.  ?  The right ventricle is mildly dilated. The right ventricular systolic function is normal.  ?  Right Atrium: Mildly dilated.  ?  No significant valvular disease.  ?  No pericardial effusion.  ?  Compared with study dated 09/25/2018, global LV function appears slightly improved.            Exercise tolerance: <4 METS       Beta Blocker therapy: Yes      Hypertension          Coronary artery disease (PCI to PDA in 2014)            Dysrhythmias (AF with RVR on admission - rate contro. Patient was diagnosed with afib 6 mo ago.)      PVD (?subclavian stenosis)      CHF (HFrEF, EF 47% by TTE, 29% MPI, current exacerbation)        Dyspnea on exertion      GI/Hepatic/Renal             No GERD        No liver disease:         Renal disease (Unknown baseline):         Electrolyte problem (hypokalemia)        Recent mesenteric revascularization with concerns for reperfusion injury      Neuro/Psych       No seizures        No CVA      Chronic opioid use      Musculoskeletal         L elbow MRSA septic bursitis, LUE cellulitis        Endocrine/Other           Anemia        Obesity        Sepsis    Constitution       Fever        Concern for intraabdominal anastomotic leak causing sepsis         Physical Exam    Airway Findings      Mallampati: II      TM distance: <3 FB      Neck ROM: full      Mouth opening: good    Dental Findings:         Comments: No teeth    Cardiovascular Findings:       Rhythm: irregular      Rate: abnormal      Comments: A fib with RVR    Pulmonary Findings:    Rhonchi and decreased breath sounds.      Comments: Bilateral crackles on end expiration    Abdominal Findings:       Obese    Neurological Findings:         Altered mental status      Sedated  Comments: Patient not alert upon arrival to OR. Could not answer yes or no questions.    Constitutional findings:       Well-nourished       Previous Airway Procedure Notes Displaying the 3 most recent records      Date Mask Difficulty Techninque used for succesful ETT placement Blade Type Blade Size View with successful placement Number of attempts    10/22/23 0 - not attempted video laryngoscopy Glide Scope 4 Not documented 1    10/18/23 0 - not attempted video laryngoscopy Glide Scope 4 grade I - full view of glottis 1    10/10/23 0 - not attempted video laryngoscopy Glide Scope 3 grade IIa - partial view of glottis 1            Patient Lines/Drains/Airways Status       Active Lines:       Name Placement date Placement time Site Days    CVC Triple Non-tunneled Internal jugular, right 10/11/23  1622  -- 14    Surgical Drain Bulb (e.g. JP) Upper Abdomen #1 10/22/23  1245  -- 3    Temporary GI Tube Nasogastric Nare, right 18 FR 10/22/23  1126  -- 3    Temporary GI Tube Small bore feeding tube Nare, right Post-pyloric 10/22/23  --  -- 3    External Urinary Catheter Male urinary catheter to suction (e.g. Qivi) 10/25/23  0947  -- less than 1                  Diagnostic Tests  Hematology:   Lab Results   Component Value Date    HGB 11.8 (L) 10/25/2023    HCT 36.8 (L) 10/25/2023    PLTCT 446 (H) 10/25/2023    WBC 24.70 (H) 10/25/2023    NEUT 85 (H) 10/10/2023    ANC 20.3 (H) 10/17/2023    LYMPH 1 (L) 10/17/2023    ALC 1.00 10/10/2023    MONA 9 10/10/2023    AMC 1.60 (H) 10/10/2023    EOSA 0 10/10/2023    ABC 0.10 10/10/2023    BASOPHILS 1 09/29/2018    MCV 85.3 10/25/2023    MCH 27.3 10/25/2023    MCHC 32.0 10/25/2023    MPV 9.3 10/25/2023    RDW 16.6 (H) 10/25/2023         General Chemistry:   Lab Results   Component Value Date    NA 150 (H) 10/25/2023    K 3.3 (L) 10/25/2023    CL 112 (H) 10/25/2023    CO2 23 10/25/2023    GAP 15 (H) 10/25/2023    BUN 99 (H) 10/25/2023    CR 1.25 (H) 10/25/2023    GLU 118 (H) 10/25/2023    CA 8.4 (L) 10/25/2023    ALBUMIN 2.3 (L) 10/25/2023    LACTIC 1.6 10/22/2023    OBSCA 1.09 10/22/2023    MG 2.0 10/25/2023    TOTBILI 0.5 10/25/2023    PO4 3.8 10/25/2023      Coagulation:   Lab Results   Component Value Date    PT 16.1 (H) 10/18/2023    PTT 43.4 (H) 10/25/2023    INR 1.5 (H) 10/18/2023       PAC Plan    Anesthesia Plan    ASA score: 4 emergent   Plan: general, invasive monitoring and RSI  Special equipment/procedures: Art line and CVC  NPO status: acceptable      Informed Consent  Anesthetic plan  and risks discussed with patient.        Plan discussed with: surgeon/proceduralist, CRNA, anesthesiologist, resident and SRNA.      Alerts

## 2023-10-25 NOTE — Progress Notes
 END OF SHIFT SUMMARY [facility]28    Admission Date: 10/10/2023  Length of Stay: LOS: 15 days        Acute events and nursing interventions: Attempted to promote sleep for pt. TV turned off before 2200, lights off, clustered cares, provided w/ eye mask    Communication with providers (include name/title): J. [place] for medication d/t pt feeling itchy, anxious, and inability to sleep      Did patient sleep overnight? No      Patient demeanor and cognition: Pt restless, endorsing feeling hot/cold (pt afebrile), endorses feeling anxious, itchy. Demanding of swabs in excess of hourly limit.       Pain  Was the patient's pain controlled this shift? Yes  If no, what new interventions were implemented?            Is the patient intubated? No      Patient Education  Patient education provided: Mobility goals and Skin integrity and wound care  Other patient education provided: Sleep hygiene; pt needs to sleep however continues resist sleep  Learners: Patient  Method/materials used: Verbal teaching  Response to learning: Some Evidence of Learning, Needs Reinforcement    Patient Goal(s):  Patient will Achieve timely healing, Decreased episodes of delirium, and Demonstrate improved self-care by discharge date

## 2023-10-25 NOTE — Anesthesia Post-Procedure Evaluation
Post-Anesthesia Evaluation    Name: Flint Hakeem      MRN: [id number]     DOB: 29-Apr-1951     Age: 73 y.o.     Sex: male   __________________________________________________________________________     Procedure Information       Anesthesia Start Date/Time: 10/25/23 2054    Procedures:       ENTERECTOMY RESECTION AND ANASTOMOSIS      SUTURE SMALL INTESTINE FOR PERFORATED ULCER/ DIVERTICULUM/ WOUND/ INJURY/ RUPTURE - SINGLE PERFORATION    Location: MAIN OR 06 / Main OR/Periop    Surgeons: Weber Cooks, MD            Post-Anesthesia Vitals  ABP: 141/54 (01/28 2302) No vitals data found for the desired time range.  HR 97  SA02 96  RR16  Temp 98.31F      Post Anesthesia Evaluation Note    Evaluation location: ICU  Patient participation: patient intubated, unable to assess; expectation of recovery by ICU physician  Level of consciousness: intubated & sedated    Ventilator settings  Mode: VC  FIO2: 100  Tidal Volume: 500  Vent rate: 16  PEEP: 7    Pain score: 0  Pain management: adequate    Hydration: normovolemia  Temperature: 36.0?C - 38.4?C  Airway patency: adequate    Perioperative Events       Post-op nausea and vomiting: no PONV    Postoperative Status  Cardiovascular status: hemodynamically stable  Respiratory status: ETT and supplemental oxygen  Follow-up needed: none  ICU Information  VasoactiveDrips:norepinephrine infusion and vasopressin  Blood Products Given-no                Staff involved in transport include: CRNA, OR nurse, SRNA and surg resident      Perioperative Events  There were no known complications for this encounter.

## 2023-10-25 NOTE — Care Coordination-Inpatient
 Discussed with primary team. Palliative will plan to sign off at this time. Goals are clear and remain aggressive. Plan to d/c to rehab setting.     If patient needing to be seen or needing further assistance, please contact us.      Palliative Care Team 5  Dr. Collie Siad  Nila Nephew, NP  Truddie Coco, SW     Lequita Halt, APRN-NP  Palliative Care  Available on Voalte  Phone: [phone]  Nights/Weekends - Page [phone] for Palliative Care On-Call      Thank you for allowing [place] to assist in the care of this patient. Please call with questions/concerns.

## 2023-10-25 NOTE — Unmapped
 Brief Operative Note    Name: Alias Villagran is a 73 y.o. 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     Specimen(s) Removed/Disposition:   ID Type Source Tests Collected by Time Destination   1 : JEJUNUM Tissue Jejunum SURGICAL PATHOLOGY Weber Cooks, MD 10/25/2023 2229    A :  Blood Venous TYPE & CROSSMATCH Weber Cooks, MD 10/25/2023 2128    B :  Blood Arterial BLOOD GASES, ARTERIAL Weber Cooks, MD 10/25/2023 2156    C :  Blood Venous HEMOGLOBIN & HEMATOCRIT Weber Cooks, MD 10/25/2023 2156    D :  Blood Venous LACTIC ACID (BG - RAPID LACTATE) Weber Cooks, MD 10/25/2023 2156    E :  Blood Arterial POTASSIUM, BG Weber Cooks, MD 10/25/2023 2156    F :  Blood Arterial SODIUM,BG Weber Cooks, MD 10/25/2023 2156    G :  Blood Arterial BASIC METABOLIC PANEL, CBC, LACTIC ACID (BG - RAPID LACTATE), POTASSIUM, BG, SODIUM,BG, POC BLOOD GAS ARTERIAL Weber Cooks, MD 10/25/2023 2156        Complications:  None      Implants: * No implants in log *    Drains: Details on drains available on LDA report    Disposition:  ICU -unstable    Weber Cooks, MD  Pager

## 2023-10-26 ENCOUNTER — Inpatient Hospital Stay: Admit: 2023-10-26 | Discharge: 2023-10-26 | Payer: MEDICARE

## 2023-10-26 LAB — COMPREHENSIVE METABOLIC PANEL
~~LOC~~ BKR ALBUMIN: 2.3 g/dL — ABNORMAL LOW (ref 3.5–5.0)
~~LOC~~ BKR ALK PHOSPHATASE: 87 U/L (ref 25–110)
~~LOC~~ BKR ALT: 37 U/L (ref 7–56)
~~LOC~~ BKR ANION GAP: 14 — ABNORMAL HIGH (ref 3–12)
~~LOC~~ BKR AST: 18 U/L (ref 7–40)
~~LOC~~ BKR BLD UREA NITROGEN: 109 mg/dL — ABNORMAL HIGH (ref 7–25)
~~LOC~~ BKR CO2: 21 mmol/L (ref 21–30)
~~LOC~~ BKR CREATININE: 1.8 mg/dL — ABNORMAL HIGH (ref 0.40–1.24)
~~LOC~~ BKR GLOMERULAR FILTRATION RATE (GFR): 40 mL/min — ABNORMAL LOW (ref >60)
~~LOC~~ BKR POTASSIUM: 4.7 mmol/L — ABNORMAL LOW (ref 3.5–5.1)

## 2023-10-26 LAB — BLOOD GASES, ARTERIAL
~~LOC~~ BKR BASE DEFICIT-ART: 5.8 mmol/L
~~LOC~~ BKR BASE DEFICIT-ART: 7.6 mmol/L
~~LOC~~ BKR BASE DEFICIT-ART: 5 mmol/L
~~LOC~~ BKR BICARB, ART(CAL): 19 mmol/L — ABNORMAL LOW (ref 21.0–28.0)
~~LOC~~ BKR BICARB, ART(CAL): 18 mmol/L — ABNORMAL LOW (ref 21.0–28.0)
~~LOC~~ BKR BICARB, ART(CAL): 20 mmol/L — ABNORMAL LOW (ref 21.0–28.0)
~~LOC~~ BKR FIO2 VALUE-ART: 100 %
~~LOC~~ BKR FIO2 VALUE-ART: 40 %
~~LOC~~ BKR O2 SAT-ART: 93 % — ABNORMAL LOW (ref 95.0–99.0)
~~LOC~~ BKR O2 SAT-ART: 98 % (ref 95.0–99.0)
~~LOC~~ BKR O2 SAT-ART: 97 % (ref 95.0–99.0)
~~LOC~~ BKR PCO2-ART: 55 mmHg — ABNORMAL HIGH (ref 35–45)
~~LOC~~ BKR PCO2-ART: 57 mmHg — ABNORMAL HIGH (ref 35–45)
~~LOC~~ BKR PCO2-ART: 33 mmHg — ABNORMAL LOW (ref 35–45)
~~LOC~~ BKR PH-ART: 7.2 — ABNORMAL LOW (ref 7.35–7.45)
~~LOC~~ BKR PH-ART: 7.1 — CL (ref 7.35–7.45)
~~LOC~~ BKR PH-ART: 7.3 (ref 7.35–7.45)
~~LOC~~ BKR PO2-ART: 87 mmHg (ref 80–100)
~~LOC~~ BKR PO2-ART: 226 mmHg — ABNORMAL HIGH (ref 80–100)
~~LOC~~ BKR PO2-ART: 97 mmHg (ref 80–100)

## 2023-10-26 LAB — LACTIC ACID(LACTATE): ~~LOC~~ BKR LACTIC ACID: 1.2 mmol/L — ABNORMAL HIGH (ref 0.5–2.0)

## 2023-10-26 LAB — CBC
~~LOC~~ BKR HEMATOCRIT: 34 % — ABNORMAL LOW (ref 40.0–50.0)
~~LOC~~ BKR HEMATOCRIT: 34 % — ABNORMAL LOW (ref 40.0–50.0)
~~LOC~~ BKR HEMOGLOBIN: 10 g/dL — ABNORMAL LOW (ref 13.5–16.5)
~~LOC~~ BKR MCH: 27 pg (ref 26.0–34.0)
~~LOC~~ BKR MCHC: 31 g/dL — ABNORMAL LOW (ref 32.0–36.0)
~~LOC~~ BKR MCV: 86 fL (ref 80.0–100.0)
~~LOC~~ BKR MPV: 9.3 fL (ref 7.0–11.0)
~~LOC~~ BKR MPV: 9.4 fL (ref 7.0–11.0)
~~LOC~~ BKR PLATELET COUNT: 500 10*3/uL — ABNORMAL HIGH (ref 150–400)
~~LOC~~ BKR RBC COUNT: 3.9 10*6/uL — ABNORMAL LOW (ref 4.40–5.50)
~~LOC~~ BKR RBC COUNT: 3.9 10*6/uL — ABNORMAL LOW (ref 4.40–5.50)
~~LOC~~ BKR RDW: 16 % — ABNORMAL HIGH (ref 11.0–15.0)
~~LOC~~ BKR RDW: 16 % — ABNORMAL HIGH (ref 11.0–15.0)
~~LOC~~ BKR WBC COUNT: 33 10*3/uL — ABNORMAL HIGH (ref 4.50–11.00)
~~LOC~~ BKR WBC COUNT: 35 10*3/uL — ABNORMAL HIGH (ref 4.50–11.00)

## 2023-10-26 LAB — POC GLUCOSE
~~LOC~~ BKR POC GLUCOSE: 214 mg/dL — ABNORMAL HIGH (ref 70–100)
~~LOC~~ BKR POC GLUCOSE: 202 mg/dL — ABNORMAL HIGH (ref 70–100)
~~LOC~~ BKR POC GLUCOSE: 156 mg/dL — ABNORMAL HIGH (ref 70–100)
~~LOC~~ BKR POC GLUCOSE: 224 mg/dL — ABNORMAL HIGH (ref 70–100)
~~LOC~~ BKR POC GLUCOSE: 266 mg/dL — ABNORMAL HIGH (ref 70–100)
~~LOC~~ BKR POC GLUCOSE: 272 mg/dL — ABNORMAL HIGH (ref 70–100)
~~LOC~~ BKR POC GLUCOSE: 297 mg/dL — ABNORMAL HIGH (ref 70–100)
~~LOC~~ BKR POC GLUCOSE: 304 mg/dL — ABNORMAL HIGH (ref 70–100)
~~LOC~~ BKR POC GLUCOSE: 307 mg/dL — ABNORMAL HIGH (ref 70–100)
~~LOC~~ BKR POC GLUCOSE: 223 mg/dL — ABNORMAL HIGH (ref 70–100)
~~LOC~~ BKR POC GLUCOSE: 155 mg/dL — ABNORMAL HIGH (ref 70–100)
~~LOC~~ BKR POC GLUCOSE: 165 mg/dL — ABNORMAL HIGH (ref 70–100)

## 2023-10-26 LAB — TYPE & CROSSMATCH
~~LOC~~ BKR ANTIBODY SCREEN: NEGATIVE
~~LOC~~ BKR ANTIBODY SCREEN: NEGATIVE
~~LOC~~ BKR UNITS ORDERED: 0
~~LOC~~ BKR UNITS ORDERED: 0

## 2023-10-26 LAB — POTASSIUM, BG: POTASSIUM BLOOD GAS: 4.2 mmol/L (ref 3.5–5.1)

## 2023-10-26 LAB — PTT (APTT)
~~LOC~~ BKR PTT: 29 s — ABNORMAL HIGH (ref 24.0–36.5)
~~LOC~~ BKR PTT: 29 s (ref 24.0–36.5)

## 2023-10-26 LAB — SODIUM,BG: SODIUM BLOOD GAS: 146 mmol/L (ref 137–147)

## 2023-10-26 MED ORDER — METRONIDAZOLE IN NACL (ISO-OS) 500 MG/100 ML IV PGBK
500 mg | Freq: Two times a day (BID) | INTRAVENOUS | 0 refills | Status: DC
Start: 2023-10-26 — End: 2023-10-26
  Administered 2023-10-26: 12:00:00 500 mg via INTRAVENOUS

## 2023-10-26 MED ORDER — HALOPERIDOL LACTATE 5 MG/ML IJ SOLN
2 mg | INTRAVENOUS | 0 refills | Status: DC | PRN
Start: 2023-10-26 — End: 2023-10-27
  Administered 2023-10-26 – 2023-10-27 (×2): 2 mg via INTRAVENOUS

## 2023-10-26 MED ORDER — INSULIN REGULAR IN 0.9 % NACL 100 UNIT/100 ML (1 UNIT/ML) IV SOLN
1-32 [IU]/h | INTRAVENOUS | 0 refills | Status: DC
Start: 2023-10-26 — End: 2023-10-26
  Administered 2023-10-26: 10:00:00 3 [IU]/h via INTRAVENOUS

## 2023-10-26 MED ORDER — LORAZEPAM 2 MG/ML IJ SOLN GROUP
1 mg | Freq: Once | INTRAVENOUS | 0 refills | Status: CP
Start: 2023-10-26 — End: ?
  Administered 2023-10-26: 21:00:00 1 mg via INTRAVENOUS

## 2023-10-26 MED ORDER — MORPHINE 4 MG/ML IV SYRG
4 mg | Freq: Once | INTRAVENOUS | 0 refills | Status: CP
Start: 2023-10-26 — End: ?
  Administered 2023-10-26: 21:00:00 4 mg via INTRAVENOUS

## 2023-10-26 MED ORDER — NOREPINEPHRINE BITARTRATE-D5W 4 MG/250 ML (16 MCG/ML) IV SOLN
0-.5 ug/kg/min | INTRAVENOUS | 0 refills | Status: DC
Start: 2023-10-26 — End: 2023-10-26
  Administered 2023-10-26: 15:00:00 0.08 ug/kg/min via INTRAVENOUS
  Administered 2023-10-26: 11:00:00 0.16 ug/kg/min via INTRAVENOUS
  Administered 2023-10-26: 11:00:00 0.18 ug/kg/min via INTRAVENOUS

## 2023-10-26 MED ORDER — LORAZEPAM 2 MG/ML IJ SOLN GROUP
1 mg | INTRAVENOUS | 0 refills | Status: DC | PRN
Start: 2023-10-26 — End: 2023-10-27
  Administered 2023-10-26 – 2023-10-27 (×5): 1 mg via INTRAVENOUS

## 2023-10-26 MED ORDER — GLYCOPYRROLATE 0.2 MG/ML IJ SOLN
.4 mg | INTRAVENOUS | 0 refills | Status: DC | PRN
Start: 2023-10-26 — End: 2023-10-27
  Administered 2023-10-26 – 2023-10-27 (×2): 0.4 mg via INTRAVENOUS

## 2023-10-26 MED ORDER — MAGNESIUM SULFATE IN D5W 1 GRAM/100 ML IV PGBK
1 g | INTRAVENOUS | 0 refills | Status: CP
Start: 2023-10-26 — End: ?
  Administered 2023-10-26: 14:00:00 1 g via INTRAVENOUS

## 2023-10-26 MED ORDER — LACTATED RINGERS IV SOLP
1000 mL | INTRAVENOUS | 0 refills | Status: CP
Start: 2023-10-26 — End: ?
  Administered 2023-10-26: 12:00:00 1000 mL via INTRAVENOUS

## 2023-10-26 MED ORDER — MAGNESIUM SULFATE IN D5W 1 GRAM/100 ML IV PGBK
1 g | INTRAVENOUS | 0 refills | Status: CP
Start: 2023-10-26 — End: ?
  Administered 2023-10-26: 13:00:00 1 g via INTRAVENOUS

## 2023-10-26 MED ORDER — MORPHINE 2 MG/ML IV SYRG
1-2 mg | INTRAVENOUS | 0 refills | Status: DC | PRN
Start: 2023-10-26 — End: 2023-10-27
  Administered 2023-10-26 – 2023-10-27 (×6): 2 mg via INTRAVENOUS

## 2023-10-26 MED ORDER — BISACODYL 10 MG RE SUPP
10 mg | Freq: Every day | RECTAL | 0 refills | Status: DC | PRN
Start: 2023-10-26 — End: 2023-10-27

## 2023-10-26 MED ADMIN — AMIODARONE IN DEXTROSE,ISO-OSM 360 MG/200 ML (1.8 MG/ML) IV SOLN [307603]: 0.5 mg/min | INTRAVENOUS | Stop: 2023-10-26 | NDC 43066036020

## 2023-10-26 NOTE — Progress Notes
 END OF SHIFT SUMMARY [facility]28    Admission Date: 10/10/2023  Length of Stay: LOS: 16 days        Acute events and nursing interventions:  OR with Dr. Vernie Shanks for leak. Recovered after OR. Titrated pressors (levo and vaso) to maintain MAP >65. Heparin gtt initiated, Insulin gtt initiated for hyperglycemia, 2 L LR given. Ventilator settings adjusted based on ABG results. See doc flowsheets.       Communication with providers (include name/title):  Dr. Vernie Shanks, Dr. Lendell Caprice, Dr. Briant Cedar    Did patient sleep overnight? N/A      Patient demeanor and cognition: Sedated       Pain  Was the patient's pain controlled this shift? Yes  If no, what new interventions were implemented?            Is the patient intubated? Yes   Did patient get a breathing trial? No      Patient Education  Patient education provided: Glucose management and Post procedure care  Other patient education provided:  Learners: Patient and Family  Method/materials used: Verbal teaching  Response to learning: No Evidence of Learning    Patient Goal(s):  Patient will Achieve timely healing and Improve nutritional intake by discharge date

## 2023-10-26 NOTE — Progress Notes
 OCCUPATIONAL THERAPY  NOTE   Name: Blake Decker   MRN: [id number]     DOB: 1951/09/17      Age: 73 y.o.  Admission Date: 10/10/2023     LOS: 16 days     Date of Service: 10/26/2023      Patient not medically stable for therapy at this time, abdomen in discontinuation. OT will continue to follow.    Therapist: Nelma Rothman, OTR/L [id number]  Date: 10/26/2023

## 2023-10-26 NOTE — Progress Notes
 Nutrition Support Service (NSS) Sign off Note        Blake Decker is a 73 y.o. 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 and anastomotic leak s/p ex lap, SBR, left in discontinuity, aorto-mesenteric hepatic and SMA bypass with bifurcated graft, cauterization of iatrogenic splenic capsular tear, Abthera placement (1/21), ex lap SBR washout, abthera placement (1/23), ex lap SBR x2 with anastomosis, abdominal closure (1/25) c/b leak s/p ex lap, SBR, in discontinuity, repair enterotomy x3, abthera (1/28).      1/29: Pt NPO, on day 14 of PN. Palliative Care on board for goals of care discussions- Family meeting this morning with plans for DNAR-Full intervention- no further escalation; likely transition to CMO in next 24 hrs after all family has had time to be with him.      Primary team confirmed that TPN to be discontinued after current bag completed tonight. NSS will sign off.        [place] Pink, RD, LD, CNSC  Available on Voalte

## 2023-10-26 NOTE — Progress Notes
 Adult Mechanical Ventilator Liberation    Name: Blake Decker   MRN: [id number]     DOB: [birth year]      Age: 72 y.o.  Admission Date: 10/10/2023     LOS: 16 days     Date of Service: 10/26/2023        Adult Mechanical Ventilator Liberation: Twice daily     Weaning Readiness Screen Met (RT Only):: No, no evidence of improvement of reversal of cause of respiratory failure (e.g. fluid overload, pneumonia, ARDS, etc.);No, patient failed SAT and RASS is >1 or <-2  SAT Safety Screen (Nursing Only): Pass-Perform spontaneous awakening trial      Pt failed nursing SAT due to respiratory distress and increase WOB.

## 2023-10-26 NOTE — Progress Notes
 PALLIATIVE CARE INPATIENT NOTE     Name: Blake Decker            MRN: [id number]                DOB: 01-18-1951          Age: 73 y.o.  Admission Date: 10/10/2023             LOS: 16 days    ASSESSMENT/PLAN     Blake Decker is a 73 y.o. 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  Palliative Care consulted on 1/21 for complex medical decision making.     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  In short updating CODE STATUS to DNAR-FI with a likely transition to comfort measures only in the next 24 hours.    RECOMMENDATIONS:   CODE STATUS changed to DNAR-FI, no escalation of care  Anticipate transition to comfort measures only within the next 24 hours    If patient and family decide to pursue a comfort plan of care would recommend the following:  CODE STATUS change to comfort measures only with supporting ACP documentation  Pain/dyspnea management: IV Morphine Hydromorphone 0.5-1 mg every 15 minutes as needed  Anxiety management: IV Ativan 1 mg every 2 hours as needed  Restlessness/agitation/delirium management: IV Haldol 2 mg every 4 hours as needed  Secretion management: Glycopyrrolate as needed  Palliative extubation, would pretreat with 1 mg of IV hydromorphone and 1 mg of IV Ativan.  Discontinue propofol  After extubation would stop pressors  Disposition: Anticipate end-of-life in hospital    Collie Siad, MD  Palliative Attending  Available on Voalte      PALLIATIVE CARE PLANNING     Advance Care Planning:   Identified Health Care Decision Maker -  Wife Blake Decker   DPOA - NA  TPOPP/OSHDNAR - Full Code-Not Applicable    PC Clinic - NA    Medication safety - NA    Disposition planning - Too soon to determine    SUBJECTIVE     CC/Reason for Visit: Complex medical decision making   Additional history from: Chart, team  Interval events: Patient declined significantly overnight having to go back to the OR found to have leak and perforation.  Patient returned ventilated and on multiple pressors.    OBJECTIVE     Blood pressure 91/58, pulse 85, temperature 37.3 ?C (99.1 ?F), height 182.9 cm (6' 0.01), weight 101.9 kg (224 lb 10.4 oz), SpO2 99%.  Physical Exam  PHYSICAL EXAM  Vital signs reviewed.  General: Older gentleman, intubated, comfortable appearing  HEENT: ET tube in place, NG tube in place.  Normocephalic and atraumatic.   Pulm: Synchronous with the ventilator, no accessory muscle use, initiating own breath  Extremeties: No cyanosis or clubbing.   Skin: No rashes on exposed skin.   Neuro: No myoclonus or tremor.   Psych: No agitation or restlessness    Lab  Results:  CBC   Lab Results   Component Value Date/Time    WBC 35.20 (H) 10/26/2023 03:30 AM    HGB 10.8 (L) 10/26/2023 03:30 AM    PLTCT 500 (H) 10/26/2023 03:30 AM     Lab Results   Component Value Date/Time    NEUT 85 (H) 10/10/2023 01:50 AM    ANC 20.3 (H) 10/17/2023 01:59 AM      Chemistries   Lab Results   Component Value Date/Time    NA 143 10/26/2023 03:27 AM    K 5.0 10/26/2023 03:27 AM    BUN 110 (H) 10/26/2023 03:27 AM    CR 1.97 (H) 10/26/2023 03:27 AM    GLU 317 (H) 10/26/2023 03:27 AM     Lab Results   Component Value Date/Time    CA 7.7 (L) 10/26/2023 03:27 AM    PO4 8.4 (H) 10/26/2023 03:27 AM    ALBUMIN 2.3 (L) 10/26/2023 03:27 AM    TOTPROT 5.0 (L) 10/26/2023 03:27 AM    ALKPHOS 96 10/26/2023 03:27 AM    AST 27 10/26/2023 03:27 AM    ALT 41 10/26/2023 03:27 AM TOTBILI 0.5 10/26/2023 03:27 AM    GFR 36 (L) 10/26/2023 03:27 AM    GFRAA >60 05/28/2019 06:06 PM        Other Pertinent Diagnostic Results:   Reviewed    High medical decision making due to the following:  1 acute or chronic illness that poses a threat to life or bodily function  IV controlled substances and decision not to resuscitate or de-escalate care due to poor prognosis      Palliative Care Data:  Patient Location at Time of Consultation: Hospital - ICU (includes MICU, SICU, TICU, CICU, Neuro ICU, PICU)  Primary Diagnosis: Gastrointestinal: 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

## 2023-10-26 NOTE — Progress Notes
 Patient palliatively extubated to RA @ 1500

## 2023-10-26 NOTE — Progress Notes
 Chaplain Note:    Admit Date: 10/10/2023         Reason for Visit:  Chaplain met with patient's family per request from the medical team.    Faith/Religion: Pt is listed as non-denominational Christian. [name] had asked about a priest coming, yet pt is not [employer].  Daughter Herbert Seta later shared that pt is [place].    Source of Purpose/Meaning:  I was not able to discuss with pt as he is intubated.  It appears that family may be important as pt had several family members and more arriving.    Worries/Concerns/Struggles:    [name] shared that pt had been through many surgeries and was not improving.    Method(s) of Coping: Prayer and family support appear to be helpful.    Support System:  Pt has family members including spouse, children Kathlene November and Herbert Seta, and some step children.    Interventions/Plan: I introduced myself and my role as a chaplain to pt's [name].  She asked me to pray for pt and I did so.  As I prayed, son Kathlene November arrived.  We talked about the difficult road that pt had been on for awhile.     I met with other family members including daughter Herbert Seta in the waiting room and introduced myself and offered them support.  Chaplaincy will remain available for future spiritual and emotional support.    The On-Call Chaplain is available on Voalte or can be paged via the switchboard [phone] for urgent and emergent needs.   The Spiritual Care team responds to other requests within 24-hours when submitted as a Chaplain Consult in O2.            Date/Time:                      User:                                      10/26/2023 1:17 PM Antony Salmon, M.Div, [facility]      PCU 4 PCU

## 2023-10-26 NOTE — Progress Notes
 Surgical Critical Care Progress Note    Today's Date: 10/26/2023   Hospital Day: Hospital Day: 17    History of Present Illness:   Blake Decker is a 73 y.o. 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 and anastomotic leak s/p ex lap, SBR, left in discontinuity, aorto-mesenteric hepatic and SMA bypass with bifurcated graft, cauterization of iatrogenic splenic capsular tear, Abthera placement (1/21), ex lap SBR washout, abthera placement (1/23), ex lap SBR x2 with anastomosis, abdominal closure (1/25) c/b leak s/p ex lap, SBR, in discontinuity, repair enterotomy x3, abthera (1/28)    Patient Active Problem List    Diagnosis Date Noted    Closed fracture of shaft of left fibula 10/25/2023    Leukocytosis, unspecified 10/14/2023    S/P small bowel resection 10/12/2023    Moderate malnutrition (HCC) 10/11/2023    Abdominal pain, generalized 10/11/2023    Diarrhea 10/11/2023    Mesenteric ischemia (HCC) 10/11/2023    Atrial fibrillation (HCC) 10/11/2023    Acute blood loss anemia 10/11/2023    Severe sepsis with septic shock (HCC) 10/11/2023    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    Arm pain, left 10/04/2018    Acute respiratory failure with hypoxia (HCC) 09/25/2018    Severe sepsis (HCC) 09/25/2018    Olecranon bursitis of left elbow 09/25/2018    Cellulitis 09/23/2018    Atrial fibrillation with RVR (HCC) 09/23/2018    Coronary artery disease involving native coronary artery 09/23/2018    COPD (chronic obstructive pulmonary disease) (HCC) 09/23/2018    OSA (obstructive sleep apnea) 09/23/2018     Assessment & Plan:   Neuro   Acute pain due to trauma  Multimodal pain regimen:  - Tylenol IV 1000mg  q6h   - Robaxin 750 q6h  - Lidocaine topical patch daily  - Morphine prn    Anxiety  - PTA venlafaxine and buspar held     GCS 15    CV   Undifferentiated shock versus hypovolemic versus septic, resolved, HTN, Afib (Xarelto), CAD s/p PCI w/ stent (2014), dilated cardiomyopathy, angio SMA, hepatic artery, celiac stent (1/14) c/b occlusion of celiac and SMA stent s/p  aorto-mesenteric bypass to hepatic and SMA with bifurcated graft (1/21)   - MAP > 65 SBP < 200   - On 0.08 levophed   - On 1.8 vaso   - On stress dose steroids  - ASA 300 rectal   - PTA Metoprolol 5mg  q6h held  - IV amiodarone  - Heparin gtt at 500  - Per vasc okay for xarelto and ASA at discharge    - PTA antihypertensives held   - PTA xarelto held    Pulm  Acute hypoxic respiratory failure  Hx of COPD, OSA, tobaccoism   - Switch to MMV  TV: 500 RR 24 PEEP 6 FiO2: 40  Reintubated for OR, remained intubated post op   - SBT BID  - Extubate if passing trial     GI/ Fluids / Electrolytes / Nutrition  Risk for constipation, Risk for electrolyte imbalance, chronic mesenteric ischemia, SMA stenosis s/p ex lap, SBR, in discontinuity ,abthera placement (1/13) s/p ex lap, primary anastomosis, abdominal wall closure (1/16) c/b leak from  small bowel anastomosis s/p ex-lap SBR, cauterization of iatrogenic splenic capsular tear, left in discontinuity with abthera placement (1/21  S/p reopening recent laparotomy, SBR, Abthera (1/23) ex lap SBR x2 with anastomosis, abdominal closure (1/25) c/b leak s/p ex lap, SBR, in discontinuity, repair enterotomy x3, abthera (1/28)  - NPO strict  - Family discussion regarding prognosis   - Potential RTOR 1/30 pending discussion with family  - Corpak removed intra op on 1/28  - PTA Protonix 40 mg daily   - PTA lasix 40 mg currently held  - Last BM: 1/28  - TPN, pending family discussion will either continue or discontinue   - Zofran prn nausea  - Maintain NGT to LIWS: 200 ml output gastric contents (600)  - Maintain abthera   - Maintain JP to bulb suction: 360 ml SS   - Obtaining BMP at 1400 due to K being 5.0 this morning and TPN containing of K     Interval imaging through hospital course  - CT abdomen/pelvis from 1/20: without significant opacification of the stents suggestive of acute/subacute in-stent thrombosis. There also appears to be diffuse distension of the small and large bowel suggestive of ileus vs potential bowel ischemia  - Obtained CTA abdomen/pelvis on 1/21 with confirmed thrombosis of both celiac and SMA stents and pneumatosis will ESTAT to OR with vascular surgery for bypass, ex lap, possible SBR, possible ostomy, consent was obtained from patient's wife tina   - Obtained CTA abdomen/pelvis 1/28 due to JP drain becoming bilious with increased output. Imaging revealed patent graft but evidence of thickened bowel. Given the concern for an anastomotic leak decision was made to proceed to OR overnight    GU    - Cr 1.97 (1.2),  UOP 1030  - Fluid status 24 hours: +5701 ml Since Admit: +7446  - Maintain foley   - Will continue to monitor closely    Heme/ID  Acute blood loss anemia  - Hgb stable at 10.8 (11.8). No clinical signs of overt bleeding. Continue to trend serially. Optimize fluid balance. Monitor stools for signs of occult GI bleeding.    Leukocytosis  - WBC 35.2 (24),  Afebrile.   - Cultures 1/13: Urine, no growth. Fecal rotavirus, cryptosporidium, cdiff negative   - Blood cultures 1/20: Negative  - Intraoperative Wound cultures 1/21: Moderate candida glabrata (Nakaseomyces glabrata) , heavy bacteroides thetaiotaomicron   - ABX: Cefepime, Flagyl, micafungin (continuous until abdomen is closed)    Endo  - Insulin gtt    MS  Impaired mobility and activities of daily living  L ankle fracture s/p ORIF 1/1 @ [facility]  Orthopedics consulted:  - Keep the splint CDI to the LLE, report drainage or concerns to orthopedics.     -NWB to LLE, elevate the limb and offload the heel off the end of the pillow vs prevalon to LLE to keep pressure off the heel to prevent skin breakdown.   - XR left ankle ordered s/p splint change.  Orthopedics to follow.   - LLE re splinted and staples removed per ortho  -PT/OT recommend inpatient setting, consulted rehab    Daily Quality Checklist  Lines:   ETT  R IJ central line placed 1/14  JP  Abthera  Arterial line   Foley Cath  NGT  Activity progressive mobility but NWB LLE   GI Prophylaxis: protonix    DVT Prophylaxis: heparin gtt at 500  Nutrition: TPN   Bowel Regimen: none at this time  Lab Frequency: Daily  Daily CXR: No  Disposition: SICU ongoing care    Subjective  Patient resting comfortably in bed.    Objective:  Physical Exam:  General: ill appearing, ETT in place, sedated   HEENT: Normocephalic, atraumatic; mucus membranes moist, NGT in place with gastric contents, ETT in place  Lungs: No increased work of breathing on the ventilator  Heart: Atrial fibrillation, regular rate  Abdomen: Soft, abthera in place holding suction with SS output, JP in place with SS output  Extremities:  No edema; Warm, without cyanosis, LLE in splint from fibula fracture and staples removed    Labs:    Complete Blood Counts   Recent Labs     10/24/23  0417 10/25/23  0352 10/25/23  2315 10/26/23  0330   HGB 12.2* 11.8* 11.0* 10.8*   HCT 38.8* 36.8* 34.3* 34.2*   WBC 24.20* 24.70* 33.00* 35.20*   PLTCT 372 446* 478* 500*   MCV 86.3 85.3 86.4 86.9   MCH 27.1 27.3 27.7 27.3   MCHC 31.4* 32.0 32.1 31.4*   RDW 16.2* 16.6* 16.6* 16.9*   MPV 9.4 9.3 9.3 9.4     No results for input(s): NEUT, LYMPH, MONO, POIK, OVAL, PLTEST in the last 72 hours.    Invalid input(s): EOSINOPHIL         Chemistry Panel   Recent Labs     10/24/23  0417 10/25/23  0352 10/25/23  2156 10/25/23  2315 10/26/23  0327   NA 150* 150* 146 147 143   K 3.5 3.3* 4.2 4.7 5.0   CL 111* 112*  --  112* 108   CO2 24 23  --  21 19*   BUN 87* 99*  --  109* 110*   CR 1.20 1.25*  --  1.80* 1.97*   GLU 153* 118*  --  191* 317*   GAP 15* 15*  --  14* 16*   GFR >60 >60  --  40* 36*   MG 2.1 2.0  --  2.0 1.9   CA 8.2* 8.4*  --  7.7* 7.7*   PO4 3.0 3.8  --  7.3* 8.4*     Recent Labs     10/24/23  0417 10/25/23  0352 10/25/23  2315 10/26/23  0327   ALKPHOS 122* 100 87 96   AST 30 20 18 27    ALT 94* 61* 37 41   TOTPROT 5.2* 5.3* 4.9* 5.0*   TOTBILI 0.6 0.5 0.5 0.5   ALBUMIN 2.4* 2.3* 2.3* 2.3*          Coagulation Studies   Recent Labs     10/24/23  0417 10/25/23  0352 10/25/23  2315 10/26/23  0828   PTT 51.9* 43.4* 29.6 29.0   INR  --   --  1.4*  --             Vital Signs (24 hours)  BP: (91-125)/(58-81)   ABP: (92-144)/(49-65)   Temp:  [36.7 ?C (98.1 ?F)-37.4 ?C (99.3 ?F)]   Pulse:  [74-109]   Respirations:  [14 PER MINUTE-30 PER MINUTE]   SpO2:  [90 %-100 %]   O2%:  [40 %-100 %]   O2 Device: Ventilator    Medications  Scheduled Meds:acetaminophen (OFIRMEV) 1,000 mg injection 100 mL, 1,000 mg, Intravenous, Q6H*  amitriptyline/gabapentin/baclofen(#) 2/6/2 % topical cream, , Topical, Q8H  aspirin rectal suppository 300 mg, 300 mg, Rectal, QDAY  cefepime (MAXIPIME) 2 g in sodium chloride 0.9% (NS) 100 mL IVPB (MB+), 2 g,  Intravenous, Q8H*  lidocaine (LIDODERM) 5 % topical patch 2 patch, 2 patch, Topical, QDAY  methylPREDNISolone (SOLU-MEDROL PF) injection 50 mg, 50 mg, Intravenous, Q6H*  metroNIDAZOLE (FLAGYL) 500 mg IVPB 100 mL, 500 mg, Intravenous, Q12H*  micafungin (MYCAMINE) 100 mg in sodium chloride 0.9% (NS) 100 mL IVPB (MB+), 100 mg, Intravenous, Q24H*  pantoprazole (PROTONIX) injection 40 mg, 40 mg, Intravenous, QDAY  sodium bicarbonate 2% 5 mL nebulizer solution 5 mL, 5 mL, Nebulization, Q12H    Continuous Infusions:   Adult Continuous Parenteral Nutrition (PN) 80 mL/hr at 10/26/23 0729    amiodarone (CORDARONE) 360 mg/200 mL D5W IV drip (std conc) (premade) 1 mg/min (10/26/23 0640)    heparin (porcine) 20,000 units/D5W 500 mL infusion (std conc)(premade) 500 Units/hr (10/26/23 0728)    insulin regular 100 units/NS 100 mL IV drip (premade) 8 Units/hr (10/26/23 0921)    norepinephrine (LEVOPHED) 4 mg in dextrose 5% (D5W) 250 mL IV drip (std conc) 0.08 mcg/kg/min (10/26/23 0923)    propofoL (DIPRIVAN) 10 mg/mL IV drip 20 mcg/kg/min (10/26/23 0807)    vasopressin (VASOSTRICT) 20 units in sodium chloride 0.9% (NS) 100 mL IV infusion (std conc)(premade) 1.8 Units/hr (10/26/23 0558)     PRN and Respiratory Meds:albuterol-ipratropium Q4H PRN, artificial tears (PF) single dose PRN, dextrose 50% (D50) IV PRN, morphine  injection syringe Q4H PRN, [DISCONTINUED] ondansetron Q6H PRN **OR** ondansetron (ZOFRAN) IV Q6H PRN      I have personally reviewed pertinent labs, medications, radiology, and diagnostic procedures including: active problem list, medication list, allergies, family history, social history, health maintenance, notes from last encounter, lab results, imaging.     Lanae Boast, MD  Electronically Signed  10/26/2023 9:57 AM  Available on Voalte

## 2023-10-26 NOTE — Progress Notes
 Daily Progress Note      Date of Service:  10/26/2023  Name:  Blake Decker                       MRN:  [id number]   Admission Date: 10/10/2023 (LOS: 16 days)                     Assessment: Blake Decker is a 73 y.o. 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 s/p lap SBR, abthera placement (1/13), SMA and celiac stents (1/14) s/p abdominal closure (1/16) c/b stent thrombosis s/p aorto-SMA hepatic bypass, SBR, splenorrhaphy, Abthera (1/21) s/p SBR, Abthera (1/23) s/p SBR, anastomosis (1/25) c/b anastomotic leak s/p ex lap, SBR, enterotomy repair x3, Abthera (1/28)    Principal Problem:    Superior mesenteric artery stenosis (HCC)  Active Problems:    Moderate malnutrition (HCC)    Abdominal pain, generalized    Diarrhea    Mesenteric ischemia (HCC)    Atrial fibrillation (HCC)    Acute blood loss anemia    Severe sepsis with septic shock (HCC)    S/P small bowel resection    Leukocytosis, unspecified    Closed fracture of shaft of left fibula      Plan:  - On vent with pressors and sedation  - Rate controlled afib with amio gtt   - Management of NGT and corpak/nutrition per EGS/SICU team  - CTA 1/28 demonstrated patent graft  - AC and anti-platelet per SICU  - Please page [phone] with questions, concerns, or changes in clinical status.     Seen and discussed with Dr. Hollie Beach  ________________________________________________________    Subjective:  Patient on ventilator and sedated after surgery last night for concern of anastomotic leak.    Objective:  BP: (91-125)/(58-77)   ABP: (92-144)/(49-65)   Temp:  [36.7 ?C (98.1 ?F)-37.4 ?C (99.3 ?F)]   Pulse:  [74-109]   Respirations:  [14 PER MINUTE-30 PER MINUTE]   SpO2:  [90 %-100 %]   O2%:  [40 %-100 %]   O2 Device: Ventilator  Body mass index is 30.46 kg/m?Marland Kitchen    Lab Results   Component Value Date/Time    HGB 10.8 (L) 10/26/2023 03:30 AM    HCT 34.2 (L) 10/26/2023 03:30 AM    WBC 35.20 (H) 10/26/2023 03:30 AM    PLTCT 500 (H) 10/26/2023 03:30 AM    INR 1.4 (H) 10/25/2023 11:15 PM     Lab Results   Component Value Date/Time    NA 143 10/26/2023 03:27 AM    K 5.0 10/26/2023 03:27 AM    CL 108 10/26/2023 03:27 AM    CO2 19 (L) 10/26/2023 03:27 AM    BUN 110 (H) 10/26/2023 03:27 AM    CR 1.97 (H) 10/26/2023 03:27 AM    MG 1.9 10/26/2023 03:27 AM    PO4 8.4 (H) 10/26/2023 03:27 AM    CA 7.7 (L) 10/26/2023 03:27 AM     Lab Results   Component Value Date/Time    GLUPOC 224 (H) 10/26/2023 06:45 AM    GLUPOC 266 (H) 10/26/2023 06:01 AM    GLUPOC 304 (H) 10/26/2023 05:01 AM          Physical Exam  General: Alert, cooperative. No acute distress  Head: Normocephalic, without obvious abnormality, atraumatic. NGT in place with bilious output  Eyes: PERRL, EOMI, no scleral icterus  Neck: Supple, symmetrical, trachea midline  Lungs: Unlabored respirations. Symmetric chest rise. Sating well on 2 lpm HFNC  Chest wall: No tenderness or deformity  Heart: Regular rate. Hypertensive with SBP in the 170s  Abdomen: Soft, appropriately tender, distended. Prevena in place.   Skin: Skin color, texture, turgor normal. No rashes or lesions.   Extremity: No clubbing, cyanosis, or edema  Neurologic: Grossly intact.         ICD-10 code E44: Acute illness/Moderate non-severe malnutrition  Energy Intake: Less than 75% of estimated energy requirement for greater than 7days, Weight loss: 1-2% x 1 week        Loss of Subcutaneous Fat: No      Muscle Wasting: No             Malnutrition Interventions: Nutrition assessment, avoid prolonged NPO status, monitor nutrition plans.    Active Wounds          Wounds Surgical incision Medial Abdomen (Active)   10/22/23 1023   Wound Type: Surgical incision   Orientation: Medial   Location: Abdomen   Wound Location Comments:    Initial Wound Site Closure: Sutures;Staples   Initial Dressing Placed: Gauze;Negative pressure therapy (wound VAC dressing) Initial Cycle:    Initial Suction Setting (mmHg):    Pressure Injury Stages:    Pressure Injury Present Within 24 Hours of Hospital Admission:    If This Pressure Injury Is Suspected to Be Device Related, Please Select the Device::    Is the Wound Open or Closed:    Wound Assessment Dressing not removed for assessment 10/26/23 0800   Peri-wound Assessment Dry;Pale 10/26/23 0800   Wound Drainage Amount Small 10/26/23 0800   Wound Drainage Description Sanguineous 10/26/23 0800   Wound Dressing Status Intact 10/26/23 0800   Wound Dressing and/or Treatment Negative pressure therapy 10/26/23 0800   Cycle Continuous 10/26/23 0800   Suction Setting (mmHg) -125 mmHg 10/26/23 0800   Negative Pressure Therapy Care Cannister change 10/25/23 1800   Drain Output (mL) 100 ml 10/26/23 0800   Number of days: 4       Wounds Pressure injury Gluteal Cleft (Active)   10/22/23 1423   Wound Type: Pressure injury   Orientation:    Location: Gluteal Cleft   Wound Location Comments:    Initial Wound Site Closure:    Initial Dressing Placed:    Initial Cycle:    Initial Suction Setting (mmHg):    Pressure Injury Stages: Stage 1   Pressure Injury Present Within 24 Hours of Hospital Admission: No   If This Pressure Injury Is Suspected to Be Device Related, Please Select the Device::    Is the Wound Open or Closed:    Wound Assessment Non-blanchable;Red;Pink 10/26/23 0800   Peri-wound Assessment Dry;Intact 10/26/23 0800   Wound Drainage Amount None 10/26/23 0800   Wound Dressing Status None/open to air 10/26/23 0800   Number of days: 4       Wounds Surgical incision Medial;Mid Abdomen (Active)   10/25/23 2136   Wound Type: Surgical incision   Orientation: Medial;Mid   Location: Abdomen   Wound Location Comments:    Initial Wound Site Closure: Other (Comment);Staples   Initial Dressing Placed: Negative pressure therapy (wound VAC dressing)   Initial Cycle:    Initial Suction Setting (mmHg):    Pressure Injury Stages:    Pressure Injury Present Within 24 Hours of Hospital Admission:    If This Pressure Injury Is Suspected  to Be Device Related, Please Select the Device::    Is the Wound Open or Closed:    Wound Assessment Dressing not removed for assessment 10/26/23 0800   Peri-wound Assessment Intact 10/26/23 0800   Wound Drainage Amount None 10/26/23 0800   Wound Dressing Status Intact 10/26/23 0800   Cycle Continuous 10/26/23 0800   Suction Setting (mmHg) -125 mmHg 10/26/23 0800   Drain Output (mL) 0 ml 10/26/23 0400   Number of days: 1                                              ___________________________    Ileana Roup, MD   Service Pager: # [phone]

## 2023-10-27 ENCOUNTER — Inpatient Hospital Stay: Admit: 2023-10-27 | Discharge: 2023-11-26 | Disposition: E | Payer: MEDICARE | Source: Hospice

## 2023-10-27 ENCOUNTER — Encounter: Admit: 2023-10-27 | Discharge: 2023-10-27 | Payer: MEDICARE

## 2023-10-27 ENCOUNTER — Inpatient Hospital Stay: Admit: 2023-10-27 | Discharge: 2023-10-27 | Payer: MEDICARE

## 2023-10-27 DIAGNOSIS — E871 Hypo-osmolality and hyponatremia: Secondary | ICD-10-CM

## 2023-10-27 DIAGNOSIS — Z6835 Body mass index (BMI) 35.0-35.9, adult: Secondary | ICD-10-CM

## 2023-10-27 DIAGNOSIS — K551 Chronic vascular disorders of intestine: Secondary | ICD-10-CM

## 2023-10-27 DIAGNOSIS — Z7409 Other reduced mobility: Secondary | ICD-10-CM

## 2023-10-27 DIAGNOSIS — I739 Peripheral vascular disease, unspecified: Secondary | ICD-10-CM

## 2023-10-27 DIAGNOSIS — Z79899 Other long term (current) drug therapy: Secondary | ICD-10-CM

## 2023-10-27 DIAGNOSIS — N289 Disorder of kidney and ureter, unspecified: Secondary | ICD-10-CM

## 2023-10-27 DIAGNOSIS — K55059 Acute (reversible) ischemia of intestine, part and extent unspecified: Secondary | ICD-10-CM

## 2023-10-27 DIAGNOSIS — E440 Moderate protein-calorie malnutrition: Secondary | ICD-10-CM

## 2023-10-27 DIAGNOSIS — G4733 Obstructive sleep apnea (adult) (pediatric): Secondary | ICD-10-CM

## 2023-10-27 DIAGNOSIS — Z66 Do not resuscitate: Secondary | ICD-10-CM

## 2023-10-27 DIAGNOSIS — R6520 Severe sepsis without septic shock: Secondary | ICD-10-CM

## 2023-10-27 DIAGNOSIS — R578 Other shock: Secondary | ICD-10-CM

## 2023-10-27 DIAGNOSIS — E785 Hyperlipidemia, unspecified: Secondary | ICD-10-CM

## 2023-10-27 DIAGNOSIS — I2510 Atherosclerotic heart disease of native coronary artery without angina pectoris: Secondary | ICD-10-CM

## 2023-10-27 DIAGNOSIS — Z7982 Long term (current) use of aspirin: Secondary | ICD-10-CM

## 2023-10-27 DIAGNOSIS — I420 Dilated cardiomyopathy: Secondary | ICD-10-CM

## 2023-10-27 DIAGNOSIS — J9601 Acute respiratory failure with hypoxia: Secondary | ICD-10-CM

## 2023-10-27 DIAGNOSIS — I5022 Chronic systolic (congestive) heart failure: Secondary | ICD-10-CM

## 2023-10-27 DIAGNOSIS — J9811 Atelectasis: Secondary | ICD-10-CM

## 2023-10-27 DIAGNOSIS — Z515 Encounter for palliative care: Secondary | ICD-10-CM

## 2023-10-27 DIAGNOSIS — E66812 Obesity, class 2: Secondary | ICD-10-CM

## 2023-10-27 DIAGNOSIS — Z7901 Long term (current) use of anticoagulants: Secondary | ICD-10-CM

## 2023-10-27 DIAGNOSIS — E872 Acidosis, unspecified: Secondary | ICD-10-CM

## 2023-10-27 DIAGNOSIS — J449 Chronic obstructive pulmonary disease, unspecified: Secondary | ICD-10-CM

## 2023-10-27 DIAGNOSIS — Z955 Presence of coronary angioplasty implant and graft: Secondary | ICD-10-CM

## 2023-10-27 DIAGNOSIS — N179 Acute kidney failure, unspecified: Secondary | ICD-10-CM

## 2023-10-27 MED ORDER — HALOPERIDOL LACTATE 5 MG/ML IJ SOLN
2 mg | INTRAVENOUS | 0 refills | Status: CN | PRN
Start: 2023-10-27 — End: ?

## 2023-10-27 MED ORDER — MORPHINE 4 MG/ML IV SYRG
4-8 mg | INTRAVENOUS | 0 refills | Status: CN | PRN
Start: 2023-10-27 — End: ?

## 2023-10-27 MED ORDER — BISACODYL 10 MG RE SUPP
10 mg | Freq: Every day | RECTAL | 0 refills | Status: CN | PRN
Start: 2023-10-27 — End: ?

## 2023-10-27 MED ORDER — ARTIFICIAL TEARS (PF) SINGLE DOSE DROPS GROUP
2 [drp] | OPHTHALMIC | 0 refills | Status: CN | PRN
Start: 2023-10-27 — End: ?

## 2023-10-27 MED ORDER — GLYCOPYRROLATE 0.2 MG/ML IJ SOLN
.4 mg | INTRAVENOUS | 0 refills | 90.00000 days | Status: DC | PRN
Start: 2023-10-27 — End: 2023-11-01

## 2023-10-27 MED ORDER — HALOPERIDOL LACTATE 5 MG/ML IJ SOLN
2 mg | INTRAVENOUS | 0 refills | Status: DC | PRN
Start: 2023-10-27 — End: 2023-11-01

## 2023-10-27 MED ORDER — MORPHINE 2 MG/ML IV SYRG
2-4 mg | INTRAVENOUS | 0 refills | Status: DC | PRN
Start: 2023-10-27 — End: 2023-10-27

## 2023-10-27 MED ORDER — MORPHINE 4 MG/ML IV SYRG
4-8 mg | INTRAVENOUS | 0 refills | Status: DC | PRN
Start: 2023-10-27 — End: 2023-10-27

## 2023-10-27 MED ORDER — LIDOCAINE 5 % TP PTMD
2 | Freq: Every day | TOPICAL | 0 refills | Status: CN
Start: 2023-10-27 — End: ?

## 2023-10-27 MED ORDER — MORPHINE 4 MG/ML IV SYRG
4-8 mg | INTRAVENOUS | 0 refills | 7.00000 days | Status: DC | PRN
Start: 2023-10-27 — End: 2023-11-01

## 2023-10-27 MED ORDER — LORAZEPAM 2 MG/ML IJ SOLN GROUP
1 mg | INTRAVENOUS | 0 refills | 12.00000 days | Status: DC | PRN
Start: 2023-10-27 — End: 2023-11-01

## 2023-10-27 MED ORDER — MORPHINE 2 MG/ML IV SYRG
1-2 mg | INTRAVENOUS | 0 refills | Status: DC | PRN
Start: 2023-10-27 — End: 2023-10-27

## 2023-10-27 MED ORDER — ARTIFICIAL TEARS (PF) SINGLE DOSE DROPS GROUP
2 [drp] | OPHTHALMIC | 0 refills | 30.00000 days | Status: DC | PRN
Start: 2023-10-27 — End: 2023-11-01

## 2023-10-27 MED ORDER — LORAZEPAM 2 MG/ML IJ SOLN GROUP
1 mg | INTRAVENOUS | 0 refills | Status: CN | PRN
Start: 2023-10-27 — End: ?

## 2023-10-27 MED ORDER — ONDANSETRON HCL (PF) 4 MG/2 ML IJ SOLN
4 mg | INTRAVENOUS | 0 refills | Status: CN | PRN
Start: 2023-10-27 — End: ?

## 2023-10-27 MED ORDER — GLYCOPYRROLATE 0.2 MG/ML IJ SOLN
.4 mg | INTRAVENOUS | 0 refills | Status: CN | PRN
Start: 2023-10-27 — End: ?

## 2023-10-27 MED ADMIN — MORPHINE 4 MG/ML IV SYRG [320654]: 4 mg | INTRAVENOUS | @ 17:00:00 | Stop: 2023-10-27 | NDC 00409189103

## 2023-10-27 NOTE — Progress Notes
 THERAPY  NOTE      Name: Blake Decker   MRN: [id number]     DOB: Jan 09, 1951      Age: 73 y.o.  Admission Date: 10/10/2023     LOS: 17 days     Date of Service: 10/27/2023      Patient has transitioned to CMO at this time.  PT/OT will sign off.  Thank you for the consultation in this case.    Therapist: Dalphine Handing, PT, DPT  Date: 10/27/2023

## 2023-10-27 NOTE — Case Management (ED)
 Case Management Progress Note    PALLIATIVE CARE      NAME:Blake Decker                          MRN: [id number]              DOB:05-09-1951          AGE: 73 y.o.  ADMISSION DATE: 10/10/2023             DAYS ADMITTED: LOS: 17 days      Today's Date: 10/27/2023    PLAN: Anticipate dc to GIP hospice    Expected Discharge Date: 10/28/2023   Is Patient Medically Stable: Yes, monitor symptoms but CMO at this time  Are there Barriers to Discharge? no    INTERVENTION/DISPOSITION:  Discharge Planning    Palliative care team rounded on patient this day. First huddled with ICU team and then met with family. Considering patient's status at this time and options for comfort measures, referral made to [facility] for GIP eval, family hopeful for GIP in the hospital.     See palliative care attending ACP note from this day for additional details.                 Transportation              Does the Patient Need Case Management to Arrange Discharge Transport? (ex: facility, ambulance, wheelchair/stretcher, Medicaid, cab, other): No  Will the Patient Use Family Transport?: Yes  Transportation Name, Phone and Availability #1:  ([name] - [phone])  Support                 Info or Referral                 Positive SDOH Domains and Potential Barriers                   Medication Needs                                                                                                                                                         Financial                 Legal                 Other                 Discharge Disposition  Selected Continued Care - Admitted Since 10/10/2023    No services have been selected for the patient.         Truddie Coco, LMSW  Available on Voalte  Work Cell: [phone]

## 2023-10-27 NOTE — Progress Notes
 Surgical Critical Care Progress Note    Today's Date: 10/27/2023   Hospital Day: Hospital Day: 18    History of Present Illness:   Blake Decker is a 73 y.o. 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 and anastomotic leak s/p ex lap, SBR, left in discontinuity, aorto-mesenteric hepatic and SMA bypass with bifurcated graft, cauterization of iatrogenic splenic capsular tear, Abthera placement (1/21), ex lap SBR washout, abthera placement (1/23), ex lap SBR x2 with anastomosis, abdominal closure (1/25) c/b leak s/p ex lap, SBR, in discontinuity, repair enterotomy x3, abthera (1/28)    Patient Active Problem List    Diagnosis Date Noted    Closed fracture of shaft of left fibula 10/25/2023    Leukocytosis, unspecified 10/14/2023    S/P small bowel resection 10/12/2023    Moderate malnutrition (HCC) 10/11/2023    Abdominal pain, generalized 10/11/2023    Diarrhea 10/11/2023    Mesenteric ischemia (HCC) 10/11/2023    Atrial fibrillation (HCC) 10/11/2023    Acute blood loss anemia 10/11/2023    Severe sepsis with septic shock (HCC) 10/11/2023    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    Arm pain, left 10/04/2018    Acute respiratory failure with hypoxia (HCC) 09/25/2018    Severe sepsis (HCC) 09/25/2018    Olecranon bursitis of left elbow 09/25/2018    Cellulitis 09/23/2018    Atrial fibrillation with RVR (HCC) 09/23/2018    Coronary artery disease involving native coronary artery 09/23/2018    COPD (chronic obstructive pulmonary disease) (HCC) 09/23/2018    OSA (obstructive sleep apnea) 09/23/2018     Assessment & Plan:     - Patient has transitioned to CMO  - regular diet  - NGT tube since patient is in discontinuity  - GIP hospice   - Morphine and ativan prn  - MMPC     Subjective  Patient resting comfortably in bed.    Objective:  Physical Exam:  General: NAD  HEENT: Normocephalic, atraumatic; mucus membranes moist  Lungs: No increased work of breathing   Heart: Atrial fibrillation, regular rate  Abdomen: Soft, abthera in place holding suction with SS output, JP in place with SS output  Extremities:  No edema; Warm, without cyanosis, LLE in splint from fibula fracture and staples removed    Labs:    Complete Blood Counts   Recent Labs     10/25/23  0352 10/25/23  2315 10/26/23  0330   HGB 11.8* 11.0* 10.8*   HCT 36.8* 34.3* 34.2*   WBC 24.70* 33.00* 35.20*   PLTCT 446* 478* 500*   MCV 85.3 86.4 86.9   MCH 27.3 27.7 27.3   MCHC 32.0 32.1 31.4*   RDW 16.6* 16.6* 16.9*   MPV 9.3 9.3 9.4     No results for input(s): NEUT, LYMPH, MONO, POIK, OVAL, PLTEST in the last 72 hours.    Invalid input(s): EOSINOPHIL         Chemistry Panel   Recent Labs     10/25/23  0352 10/25/23  2156 10/25/23  2315 10/26/23  0327   NA 150* 146 147 143   K 3.3* 4.2 4.7 5.0   CL 112*  --  112*  108   CO2 23  --  21 19*   BUN 99*  --  109* 110*   CR 1.25*  --  1.80* 1.97*   GLU 118*  --  191* 317*   GAP 15*  --  14* 16*   GFR >60  --  40* 36*   MG 2.0  --  2.0 1.9   CA 8.4*  --  7.7* 7.7*   PO4 3.8  --  7.3* 8.4*     Recent Labs     10/25/23  0352 10/25/23  2315 10/26/23  0327   ALKPHOS 100 87 96   AST 20 18 27    ALT 61* 37 41   TOTPROT 5.3* 4.9* 5.0*   TOTBILI 0.5 0.5 0.5   ALBUMIN 2.3* 2.3* 2.3*          Coagulation Studies   Recent Labs     10/25/23  0352 10/25/23  2315 10/26/23  0828   PTT 43.4* 29.6 29.0   INR  --  1.4*  --             Vital Signs (24 hours)  ABP: (90-129)/(45-56)   Temp:  [37.1 ?C (98.8 ?F)-37.3 ?C (99.1 ?F)]   Pulse:  [73-110]   Respirations:  [11 PER MINUTE-20 PER MINUTE]   SpO2:  [89 %-99 %]   O2%:  [40 %]   O2 Device: None (Room air)    Medications  Scheduled Meds:acetaminophen (OFIRMEV) 1,000 mg injection 100 mL, 1,000 mg, Intravenous, Q6H*  lidocaine (LIDODERM) 5 % topical patch 2 patch, 2 patch, Topical, QDAY    Continuous Infusions:      PRN and Respiratory Meds:artificial tears (PF) single dose PRN, bisacodyL QDAY PRN, glycopyrrolate Q30 MIN PRN, haloperidol lactate Q4H PRN, LORazepam  (ATIVAN)  injection Q2H PRN, morphine  injection syringe Q15 MIN PRN, morphine  injection syringe Q4H PRN, [DISCONTINUED] ondansetron Q6H PRN **OR** ondansetron (ZOFRAN) IV Q6H PRN      I have personally reviewed pertinent labs, medications, radiology, and diagnostic procedures including: active problem list, medication list, allergies, family history, social history, health maintenance, notes from last encounter, lab results, imaging.     Lanae Boast, MD  Electronically Signed  10/27/2023 9:48 AM  Available on Voalte

## 2023-10-28 ENCOUNTER — Encounter: Admit: 2023-10-28 | Discharge: 2023-10-28 | Payer: MEDICARE

## 2023-10-28 LAB — SURGICAL PATHOLOGY

## 2023-10-29 DEATH — deceased

## 2023-11-02 LAB — CULTURE-FUNGAL,OTHER

## 2023-11-08 NOTE — Discharge Instructions - Pharmacy
 Physician Discharge Summary        Name: Blake Decker  Medical Record Number: [id number]        Account Number:  [id number]  Date Of Birth:  Dec 12, 1950                         Age:  73 years   Admit date:  10/27/2023                     Discharge date: No discharge date for patient encounter.     Attending Physician: Dr. Dorann Ou, MD               Service: [facility] - [phone]     Physician Summary completed by: Jamse Arn, APRN-NP     Reason for hospitalization: Bowel Ischemia; Ischemia, bowel (HCC)        Significant PMH:   Past Medical History       Past Medical History:    Abnormal heart rhythms    Atrial fibrillation (HCC)    CAD (coronary artery disease)    COPD (chronic obstructive pulmonary disease) (HCC)    Dilated cardiomyopathy (HCC)    Former tobacco use    Heart disease    Hypertension    Lung disease    Obesity, Class II, BMI 35-39.9    OSA (obstructive sleep apnea)    PVD (peripheral vascular disease) (HCC)    Tobacco abuse            Allergies: Dilaudid [hydromorphone] and Hydrochlorothiazide     Admission Physical Exam notable for:    Constitutional:       General: He is not in acute distress.     Appearance: He is obese.   HENT:      Head: Normocephalic and atraumatic.      Nose: Nose normal.   Eyes:      Extraocular Movements: Extraocular movements intact.      Pupils: Pupils are equal, round, and reactive to light.   Cardiovascular:      Rate and Rhythm: Normal rate and regular rhythm.   Pulmonary:      Effort: Pulmonary effort is normal.      Breath sounds: Normal breath sounds. No wheezing or rales.   Abdominal:      General: Abdomen is flat. There is no distension.      Palpations: Abdomen is soft.      Tenderness: There is no abdominal tenderness (generalized abdominal tenderness).   Musculoskeletal:         General: Normal range of motion.      Cervical back: Normal range of motion.   Neurological:      General: No focal deficit present.      Mental Status: He is alert and oriented to person, place, and time.      Admission Lab/Radiology studies notable for:   No orders to display         Complete Blood Counts         Recent Labs     10/25/23  0352 10/25/23  2315 10/26/23  0330   HGB 11.8* 11.0* 10.8*   HCT 36.8* 34.3* 34.2*   WBC 24.70* 33.00* 35.20*   PLTCT 446* 478* 500*   MCV 85.3 86.4 86.9   MCH 27.3 27.7 27.3   MCHC 32.0 32.1 31.4*   RDW 16.6* 16.6* 16.9*   MPV 9.3 9.3 9.4  No results for input(s): NEUT, LYMPH, MONO, POIK, OVAL, PLTEST in the last 72 hours.     Invalid input(s): EOSINOPHIL      Chemistry Panel          Recent Labs     10/25/23  0352 10/25/23  2156 10/25/23  2315 10/26/23  0327   NA 150* 146 147 143   K 3.3* 4.2 4.7 5.0   CL 112*  --  112* 108   CO2 23  --  21 19*   BUN 99*  --  109* 110*   CR 1.25*  --  1.80* 1.97*   GLU 118*  --  191* 317*   GAP 15*  --  14* 16*   GFR >60  --  40* 36*   MG 2.0  --  2.0 1.9   CA 8.4*  --  7.7* 7.7*   PO4 3.8  --  7.3* 8.4*            Recent Labs     10/25/23  0352 10/25/23  2315 10/26/23  0327   ALKPHOS 100 87 96   AST 20 18 27    ALT 61* 37 41   TOTPROT 5.3* 4.9* 5.0*   TOTBILI 0.5 0.5 0.5   ALBUMIN 2.3* 2.3* 2.3*         Coagulation Studies         Recent Labs     10/25/23  0352 10/25/23  2315 10/26/23  0828   PTT 43.4* 29.6 29.0   INR  --  1.4*  --             Brief Hospital Course:  The patient was admitted and the following issues were addressed during this hospitalization: (with pertinent details).     Blake Decker is a 73 y.o. 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 and anastomotic leak s/p ex lap, SBR, left in discontinuity, aorto-mesenteric hepatic and SMA bypass with bifurcated graft, cauterization of iatrogenic splenic capsular tear, Abthera placement (1/21), ex lap SBR washout, abthera placement (1/23), ex lap SBR x2 with anastomosis, abdominal closure (1/25) c/b leak s/p ex lap, SBR, in discontinuity, repair enterotomy x3, abthera (1/28)      Discussed hospital visit with patient, answered all questions, and reviewed the plan for discharge. The patient was verbally instructed to return to the ED immediately for further evaluation if there are any new or concerning symptoms, or worsening of current symptoms. The patient acknowledged understanding of these instructions. Written discharge instructions were also given to supplement this information.        Condition at Discharge: Stable     Discharge Diagnoses:    Hospital Problems                  Active Problems     * (Principal) Ischemia, bowel [facility])         Surgical Procedures:   1/13 Diagnostic laparoscopy with ICG angiography   Enterectomy left in discontinuity  1/28 ENTERECTOMY RESECTION AND ANASTOMOSIS  SUTURE SMALL INTESTINE FOR PERFORATED ULCER/ DIVERTICULUM/ WOUND/ INJURY/ RUPTURE - SINGLE PERFORATION  Significant Diagnostic Studies and Procedures: noted in brief hospital course     Consults:  None     Patient Disposition:  GIP         Patient instructions/medications:   Scheduled appointments:       Nov 08, 2023 9:00 AM  Postoperative visit with Hector Brunswick, PA-C  Orthopedics and Sports Medicine: [facility] [address]  [address]  [place] [address]  [phone]             No discharge procedures on file.   Current Discharge Medication List                CONTINUE these medications which have NOT CHANGED     Details   artificial tears (PF) single dose ophthalmic solution Apply two drops to both eyes as Needed for Dry Eyes. Indications: dry eye     PRESCRIPTION TYPE:  No Print       glycopyrrolate (ROBINUL) 0.2 mg/mL injection Administer 2 mL through vein every 30 minutes as needed (secretions, gurgling, or rattling). Indications: adjunct therapy for peptic ulcer disease     PRESCRIPTION TYPE:  No Print       haloperidol lactate (HALDOL) 5 mg/mL injection solution Administer 0.4 mL through vein every 4 hours as needed. Indications: psychotic disorder     PRESCRIPTION TYPE:  No Print       LORazepam (ATIVAN) 2 mg/ml injection solution Administer 0.5 mL through vein every 2 hours as needed. Indications: anxiety     PRESCRIPTION TYPE:  No Print       morphine injection 4 mg/mL Administer 1-2 mL through vein every 15 minutes as needed (First-line). Indications: pain     PRESCRIPTION TYPE:  No Print                 Pending items needing follow up: None     Signed:  Jamse Arn, APRN-NP  10/27/2023       cc:  Primary Care Physician:  Clabe Seal   Verified  Referring physicians:     Additional provider(s):           Cosigned by: Maida Sale, DO at 10/27/23 1434     Revision History

## 2023-11-26 DEATH — deceased

## 2023-11-28 ENCOUNTER — Encounter: Admit: 2023-11-28 | Discharge: 2023-11-28 | Payer: MEDICARE
